# Patient Record
Sex: Female | Born: 1955
Health system: Southern US, Community
[De-identification: ages and names within clinical notes are randomized; demographics above are authoritative.]

## PROBLEM LIST (undated history)

## (undated) DIAGNOSIS — K219 Gastro-esophageal reflux disease without esophagitis: Secondary | ICD-10-CM

## (undated) DIAGNOSIS — M1990 Unspecified osteoarthritis, unspecified site: Secondary | ICD-10-CM

## (undated) DIAGNOSIS — I10 Essential (primary) hypertension: Secondary | ICD-10-CM

## (undated) DIAGNOSIS — C801 Malignant (primary) neoplasm, unspecified: Secondary | ICD-10-CM

## (undated) HISTORY — PX: BREAST BIOPSY: SHX20

## (undated) HISTORY — DX: Unspecified osteoarthritis, unspecified site: M19.90

## (undated) HISTORY — PX: JOINT REPLACEMENT: SHX530

## (undated) HISTORY — DX: Gastro-esophageal reflux disease without esophagitis: K21.9

## (undated) HISTORY — PX: KNEE SURGERY: SHX244

## (undated) HISTORY — DX: Essential (primary) hypertension: I10

---

## 1998-10-11 ENCOUNTER — Ambulatory Visit (HOSPITAL_BASED_OUTPATIENT_CLINIC_OR_DEPARTMENT_OTHER): Admission: RE | Admit: 1998-10-11 | Discharge: 1998-10-11 | Payer: Self-pay

## 1999-07-28 ENCOUNTER — Other Ambulatory Visit: Admission: RE | Admit: 1999-07-28 | Discharge: 1999-07-28 | Payer: Self-pay | Admitting: Obstetrics and Gynecology

## 2001-06-19 ENCOUNTER — Other Ambulatory Visit: Admission: RE | Admit: 2001-06-19 | Discharge: 2001-06-19 | Payer: Self-pay | Admitting: Obstetrics and Gynecology

## 2004-05-31 ENCOUNTER — Encounter: Admission: RE | Admit: 2004-05-31 | Discharge: 2004-05-31 | Payer: Self-pay | Admitting: Obstetrics and Gynecology

## 2009-03-08 ENCOUNTER — Emergency Department (HOSPITAL_COMMUNITY): Admission: EM | Admit: 2009-03-08 | Discharge: 2009-03-08 | Payer: Self-pay | Admitting: Family Medicine

## 2010-06-29 ENCOUNTER — Emergency Department (HOSPITAL_BASED_OUTPATIENT_CLINIC_OR_DEPARTMENT_OTHER): Admission: EM | Admit: 2010-06-29 | Discharge: 2010-06-29 | Payer: Self-pay | Admitting: Emergency Medicine

## 2010-06-29 ENCOUNTER — Ambulatory Visit: Payer: Self-pay | Admitting: Diagnostic Radiology

## 2010-09-17 ENCOUNTER — Ambulatory Visit (HOSPITAL_COMMUNITY)
Admission: RE | Admit: 2010-09-17 | Discharge: 2010-09-17 | Payer: Self-pay | Source: Home / Self Care | Attending: Oncology | Admitting: Oncology

## 2010-12-21 LAB — BASIC METABOLIC PANEL
BUN: 20 mg/dL (ref 6–23)
CO2: 27 mEq/L (ref 19–32)
Calcium: 8.8 mg/dL (ref 8.4–10.5)
Chloride: 105 mEq/L (ref 96–112)
Creatinine, Ser: 0.8 mg/dL (ref 0.4–1.2)
GFR calc Af Amer: >60 mL/min (ref >60)
GFR calc non Af Amer: >60 mL/min (ref >60)
Glucose, Bld: 154 mg/dL — ABNORMAL HIGH (ref 70–99)
Potassium: 4.2 mEq/L (ref 3.5–5.1)
Sodium: 139 mEq/L (ref 135–145)

## 2010-12-21 LAB — URINALYSIS, ROUTINE W REFLEX MICROSCOPIC
Bilirubin Urine: NEGATIVE
Glucose, UA: 100 mg/dL — AB
Hgb urine dipstick: NEGATIVE
Ketones, ur: NEGATIVE mg/dL
Nitrite: NEGATIVE
Protein, ur: NEGATIVE mg/dL
Specific Gravity, Urine: 1.034 — ABNORMAL HIGH (ref 1.005–1.030)
Urobilinogen, UA: 0.2 mg/dL (ref 0.0–1.0)
pH: 8 (ref 5.0–8.0)

## 2010-12-21 LAB — CBC
HCT: 37.2 % (ref 36.0–46.0)
Hemoglobin: 12.9 g/dL (ref 12.0–15.0)
MCH: 29.6 pg (ref 26.0–34.0)
MCHC: 34.6 g/dL (ref 30.0–36.0)
MCV: 85.6 fL (ref 78.0–100.0)
Platelets: 207 10*3/uL (ref 150–400)
RBC: 4.35 MIL/uL (ref 3.87–5.11)
RDW: 12.6 % (ref 11.5–15.5)
WBC: 6.1 10*3/uL (ref 4.0–10.5)

## 2010-12-21 LAB — DIFFERENTIAL
Basophils Absolute: 0.1 10*3/uL (ref 0.0–0.1)
Basophils Relative: 1 % (ref 0–1)
Eosinophils Absolute: 0 10*3/uL (ref 0.0–0.7)
Eosinophils Relative: 1 % (ref 0–5)
Lymphocytes Relative: 14 % (ref 12–46)
Lymphs Abs: 0.8 10*3/uL (ref 0.7–4.0)
Monocytes Absolute: 0.4 10*3/uL (ref 0.1–1.0)
Monocytes Relative: 6 % (ref 3–12)
Neutro Abs: 4.8 10*3/uL (ref 1.7–7.7)
Neutrophils Relative %: 78 % — ABNORMAL HIGH (ref 43–77)

## 2010-12-21 LAB — PREGNANCY, URINE: Preg Test, Ur: NEGATIVE

## 2011-01-15 LAB — URINE CULTURE: Colony Count: 75000

## 2011-01-15 LAB — POCT URINALYSIS DIP (DEVICE)
Bilirubin Urine: NEGATIVE
Glucose, UA: 100 mg/dL — AB
Ketones, ur: NEGATIVE mg/dL
Nitrite: POSITIVE — AB
Protein, ur: 100 mg/dL — AB
Specific Gravity, Urine: <=1.005 (ref 1.005–1.030)
Urobilinogen, UA: 1 mg/dL (ref 0.0–1.0)
pH: 5.5 (ref 5.0–8.0)

## 2011-01-15 LAB — POCT PREGNANCY, URINE: Preg Test, Ur: NEGATIVE

## 2014-08-15 ENCOUNTER — Ambulatory Visit (INDEPENDENT_AMBULATORY_CARE_PROVIDER_SITE_OTHER): Payer: 59 | Admitting: Family Medicine

## 2014-08-15 VITALS — BP 134/86 | HR 104 | Temp 99.1°F | Resp 18 | Ht 62.0 in | Wt 150.0 lb

## 2014-08-15 DIAGNOSIS — J069 Acute upper respiratory infection, unspecified: Secondary | ICD-10-CM

## 2014-08-15 MED ORDER — IPRATROPIUM BROMIDE 0.03 % NA SOLN: 2.0000 | Freq: Two times a day (BID) | NASAL | Status: DC

## 2014-08-15 MED ORDER — HYDROCOD POLST-CHLORPHEN POLST 10-8 MG/5ML PO LQCR: 5.0000 mL | Freq: Two times a day (BID) | ORAL | Status: DC | PRN

## 2014-08-15 NOTE — Progress Notes (Signed)
Subjective:    Patient ID: Emily Foley, female    DOB: [birth year], 58 y.o.   MRN: [id number] There are no active problems to display for this patient.  Prior to Admission medications   Medication Sig Start Date End Date Taking? Authorizing Provider  losartan (COZAAR) 50 MG tablet Take 50 mg by mouth daily.   Yes Historical Provider, MD                 No Known Allergies  HPI  This is a 58 year old female presenting with 5 days of cough and sore throat. She reports the sore throat is improving but the cough is worsening. Her cough was dry until this morning when she coughed up green phlegm. She reports this morning she also started to feel nasally congested with some sinus pressure. She felt feverish on the first day of illness but nothing since. She denies otalgia, wheezing or SOB. She does not have a history of asthma and is not a smoker. She has tried dayquil and nyquil. The nyquil has helped her sleep. 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  Review of Systems  Constitutional: Positive for fever. Negative for chills.  HENT: Positive for congestion, sinus pressure and sore throat. Negative for ear pain.   Eyes: Negative.   Respiratory: Positive for cough. Negative for shortness of breath and wheezing.   Gastrointestinal: Negative.   Skin: Negative.   Allergic/Immunologic: Positive for environmental allergies.      Objective:   Physical Exam  Constitutional: She is oriented to person, place, and time. She appears well-developed and well-nourished. No distress.  HENT:  Head: Normocephalic and atraumatic.  Right Ear: Hearing, tympanic membrane, external ear and ear canal normal.  Left Ear: Hearing, tympanic membrane, external ear and ear canal normal.  Nose: Mucosal edema present.  Mouth/Throat: Uvula is midline and mucous membranes are normal. Posterior oropharyngeal erythema present. No oropharyngeal exudate or posterior oropharyngeal edema.    Eyes: Conjunctivae and lids are normal. Right eye exhibits no discharge. Left eye exhibits no discharge. No scleral icterus.  Cardiovascular: Normal rate, regular rhythm, normal heart sounds and normal pulses.   No murmur heard. Pulmonary/Chest: Effort normal and breath sounds normal. No respiratory distress. She has no wheezes. She has no rhonchi. She has no rales.  Lymphadenopathy:       Head (right side): No submental, no submandibular, no tonsillar, no preauricular, no posterior auricular and no occipital adenopathy present.       Head (left side): No submental, no submandibular, no tonsillar, no preauricular, no posterior auricular and no occipital adenopathy present.    She has cervical adenopathy.       Right cervical: Superficial cervical adenopathy present. No deep cervical and no posterior cervical adenopathy present.      Left cervical: No superficial cervical, no deep cervical and no posterior cervical adenopathy present.  Neurological: She is alert and oriented to person, place, and time.  Skin: Skin is warm, dry and intact. No lesion and no rash noted.  Psychiatric: She has a normal mood and affect. Her speech is normal and behavior is normal. Thought content normal.      Assessment & Plan:  1. Viral URI This is likely a viral URI as it has only been going on for 5 days and cough only became productive today. She will use atrovent nasal spray, mucinex and tussionex. She will stop nyquil. Will return in 5-7 days  if symptoms worsen or fail to improve.  - ipratropium (ATROVENT) 0.03 % nasal spray; Place 2 sprays into both nostrils 2 (two) times daily.  Dispense: 30 mL; Refill: 0 - chlorpheniramine-HYDROcodone (TUSSIONEX PENNKINETIC ER) 10-8 MG/5ML LQCR; Take 5 mLs by mouth every 12 (twelve) hours as needed for cough (cough).  Dispense: 100 mL; Refill: 0   Benjaman Pott. Drenda Freeze, MHS [facility]  08/15/2014

## 2014-08-15 NOTE — Patient Instructions (Signed)
Use atrovent nasal spray and mucinex. Stop taking nyquil and take tussionex at night instead Drink plenty of water and get plenty of rest. If you are still having a productive cough in 7 days, return to clinic.

## 2014-08-17 NOTE — Progress Notes (Signed)
Patient discussed with Ms. Bush. Agree with assessment and plan of care per her note.   

## 2015-10-11 DIAGNOSIS — J320 Chronic maxillary sinusitis: Secondary | ICD-10-CM | POA: Diagnosis not present

## 2015-10-11 MED FILL — AMOX-CLAV 500-125 MG TABLET: 500-125 | 10 days supply | Qty: 20 | Fill #0

## 2015-10-11 MED FILL — HYDROCODONE-HOMATROPINE SYR: 5-1.5 | 5 days supply | Qty: 100 | Fill #0

## 2015-10-18 MED FILL — LOSARTAN POTASSIUM 50 MG TA: 50 | 90 days supply | Qty: 90 | Fill #1

## 2015-12-28 MED FILL — CELECOXIB 200 MG CAPSULE: 200 | 30 days supply | Qty: 60 | Fill #2

## 2015-12-29 DIAGNOSIS — Z1231 Encounter for screening mammogram for malignant neoplasm of breast: Secondary | ICD-10-CM | POA: Diagnosis not present

## 2016-01-19 MED FILL — LOSARTAN POTASSIUM 50 MG TA: 50 | 90 days supply | Qty: 90 | Fill #0

## 2016-03-27 DIAGNOSIS — M25461 Effusion, right knee: Secondary | ICD-10-CM | POA: Diagnosis not present

## 2016-03-27 DIAGNOSIS — M25561 Pain in right knee: Secondary | ICD-10-CM | POA: Diagnosis not present

## 2016-03-27 DIAGNOSIS — M1731 Unilateral post-traumatic osteoarthritis, right knee: Secondary | ICD-10-CM | POA: Diagnosis not present

## 2016-04-11 MED FILL — LOSARTAN POTASSIUM 50 MG TA: 50 | 90 days supply | Qty: 90 | Fill #1

## 2016-04-13 DIAGNOSIS — M25561 Pain in right knee: Secondary | ICD-10-CM | POA: Diagnosis not present

## 2016-04-13 DIAGNOSIS — M1731 Unilateral post-traumatic osteoarthritis, right knee: Secondary | ICD-10-CM | POA: Diagnosis not present

## 2016-04-13 DIAGNOSIS — M25461 Effusion, right knee: Secondary | ICD-10-CM | POA: Diagnosis not present

## 2016-04-23 MED FILL — CELECOXIB 200 MG CAPSULE: 200 | 30 days supply | Qty: 60 | Fill #3

## 2016-05-03 DIAGNOSIS — M545 Low back pain: Secondary | ICD-10-CM | POA: Diagnosis not present

## 2016-05-03 DIAGNOSIS — R350 Frequency of micturition: Secondary | ICD-10-CM | POA: Diagnosis not present

## 2016-05-03 MED FILL — CIPROFLOXACIN HCL 500 MG TA: 500 | 5 days supply | Qty: 10 | Fill #0

## 2016-06-29 MED FILL — LOSARTAN POTASSIUM 50 MG TA: 50 | 90 days supply | Qty: 90 | Fill #0

## 2016-06-29 MED FILL — valACYclovir HCL 1 GM TABS: 1 | 45 days supply | Qty: 90 | Fill #0

## 2016-06-29 MED FILL — ZOVIRAX 5% CREAM: 5 | 30 days supply | Qty: 5 | Fill #0

## 2016-07-06 MED FILL — FLUTICASONE PROP 50 MCG SPR: 50 | 30 days supply | Qty: 16 | Fill #0

## 2016-08-15 ENCOUNTER — Other Ambulatory Visit (INDEPENDENT_AMBULATORY_CARE_PROVIDER_SITE_OTHER): Payer: Self-pay | Admitting: Orthopaedic Surgery

## 2016-09-27 DIAGNOSIS — E786 Lipoprotein deficiency: Secondary | ICD-10-CM | POA: Diagnosis not present

## 2016-09-27 DIAGNOSIS — R7301 Impaired fasting glucose: Secondary | ICD-10-CM | POA: Diagnosis not present

## 2016-09-27 DIAGNOSIS — Z1211 Encounter for screening for malignant neoplasm of colon: Secondary | ICD-10-CM | POA: Diagnosis not present

## 2016-09-27 DIAGNOSIS — I10 Essential (primary) hypertension: Secondary | ICD-10-CM | POA: Diagnosis not present

## 2016-10-02 DIAGNOSIS — R509 Fever, unspecified: Secondary | ICD-10-CM | POA: Diagnosis not present

## 2016-10-02 DIAGNOSIS — J101 Influenza due to other identified influenza virus with other respiratory manifestations: Secondary | ICD-10-CM | POA: Diagnosis not present

## 2016-10-02 DIAGNOSIS — J209 Acute bronchitis, unspecified: Secondary | ICD-10-CM | POA: Diagnosis not present

## 2016-10-02 DIAGNOSIS — J029 Acute pharyngitis, unspecified: Secondary | ICD-10-CM | POA: Diagnosis not present

## 2016-10-02 MED FILL — OSELTAMIVIR PHOS 75 MG CAP: 75 | 5 days supply | Qty: 10 | Fill #0

## 2016-10-02 MED FILL — AZITHROMYCIN 250 MG TABLET: 250 | 5 days supply | Qty: 6 | Fill #0

## 2016-10-11 MED FILL — LOSARTAN POTASSIUM 50 MG TA: 50 | 90 days supply | Qty: 90 | Fill #1

## 2016-10-18 MED FILL — FLUTICASONE PROP 50 MCG SPR: 50 | 30 days supply | Qty: 16 | Fill #1

## 2016-11-23 ENCOUNTER — Other Ambulatory Visit (INDEPENDENT_AMBULATORY_CARE_PROVIDER_SITE_OTHER): Payer: Self-pay | Admitting: Orthopaedic Surgery

## 2016-11-23 MED FILL — FLUTICASONE PROP 50 MCG SPR: 50 | 30 days supply | Qty: 16 | Fill #0

## 2016-12-06 MED FILL — CELECOXIB 200 MG CAP: 200 | 30 days supply | Qty: 30 | Fill #0

## 2016-12-06 MED FILL — DICLOFENAC SODIUM 1% GEL: 1 | 37 days supply | Qty: 300 | Fill #0

## 2017-01-01 ENCOUNTER — Other Ambulatory Visit: Payer: Self-pay | Admitting: Gastroenterology

## 2017-01-01 DIAGNOSIS — R1033 Periumbilical pain: Secondary | ICD-10-CM | POA: Diagnosis not present

## 2017-01-01 DIAGNOSIS — R102 Pelvic and perineal pain: Secondary | ICD-10-CM | POA: Diagnosis not present

## 2017-01-01 DIAGNOSIS — R140 Abdominal distension (gaseous): Secondary | ICD-10-CM

## 2017-01-01 DIAGNOSIS — Z1211 Encounter for screening for malignant neoplasm of colon: Secondary | ICD-10-CM | POA: Diagnosis not present

## 2017-01-02 ENCOUNTER — Other Ambulatory Visit: Payer: Self-pay | Admitting: Gastroenterology

## 2017-01-02 DIAGNOSIS — R1033 Periumbilical pain: Secondary | ICD-10-CM

## 2017-01-03 ENCOUNTER — Ambulatory Visit (HOSPITAL_COMMUNITY)
Admission: RE | Admit: 2017-01-03 | Discharge: 2017-01-03 | Disposition: A | Discharge status: Home / Self Care | Payer: 59 | Source: Ambulatory Visit | Attending: Gastroenterology | Admitting: Gastroenterology

## 2017-01-03 DIAGNOSIS — R1033 Periumbilical pain: Secondary | ICD-10-CM | POA: Insufficient documentation

## 2017-01-03 DIAGNOSIS — R102 Pelvic and perineal pain: Secondary | ICD-10-CM | POA: Diagnosis not present

## 2017-01-03 DIAGNOSIS — R161 Splenomegaly, not elsewhere classified: Secondary | ICD-10-CM | POA: Insufficient documentation

## 2017-01-03 LAB — POCT I-STAT CREATININE: Creatinine, Ser: 0.9 mg/dL (ref 0.44–1.00)

## 2017-01-03 MED ORDER — IOPAMIDOL (ISOVUE-300) INJECTION 61%
100.0000 mL | Freq: Once | INTRAVENOUS | Status: AC | PRN
Start: 2017-01-03 — End: 2017-01-03
  Administered 2017-01-03: 100 mL via INTRAVENOUS

## 2017-01-03 MED ORDER — IOPAMIDOL (ISOVUE-300) INJECTION 61%
INTRAVENOUS | Status: AC
  Filled 2017-01-03: qty 100

## 2017-01-07 MED FILL — FLUTICASONE PROP 50 MCG SPR: 50 | 30 days supply | Qty: 16 | Fill #1

## 2017-01-09 ENCOUNTER — Telehealth: Payer: Self-pay | Admitting: Hematology and Oncology

## 2017-01-09 NOTE — Telephone Encounter (Signed)
On 4/3 pt has cld saying she's been referred to the office. I explained that I hadn't received the referral and that I would contact Dr. Lorie Apley office. Spoke to Campbell at Dr. Lorie Apley office. She states that Dr. Collene Mares had spoken to Dr. Lindi Adie at the pt. I provided Lattie Haw with our fax# to send the referral. Scheduled the pt to see Dr. Lindi Adie on 4/23 at 1pm. I cld the pt w/the appt information. Pt made aware to arrive 30 minutes early. Pt voiced understanding.

## 2017-01-14 MED FILL — LOSARTAN POTASSIUM 50 MG TA: 50 | 90 days supply | Qty: 90 | Fill #0

## 2017-01-28 ENCOUNTER — Encounter: Payer: Self-pay | Admitting: Hematology and Oncology

## 2017-01-28 ENCOUNTER — Ambulatory Visit (HOSPITAL_BASED_OUTPATIENT_CLINIC_OR_DEPARTMENT_OTHER): Payer: 59 | Admitting: Hematology and Oncology

## 2017-01-28 ENCOUNTER — Other Ambulatory Visit (HOSPITAL_BASED_OUTPATIENT_CLINIC_OR_DEPARTMENT_OTHER): Payer: 59

## 2017-01-28 VITALS — BP 132/70 | HR 88 | Temp 98.4°F | Resp 16 | Ht 62.0 in | Wt 142.5 lb

## 2017-01-28 DIAGNOSIS — R718 Other abnormality of red blood cells: Secondary | ICD-10-CM

## 2017-01-28 DIAGNOSIS — D649 Anemia, unspecified: Secondary | ICD-10-CM | POA: Diagnosis not present

## 2017-01-28 DIAGNOSIS — D696 Thrombocytopenia, unspecified: Secondary | ICD-10-CM

## 2017-01-28 DIAGNOSIS — R161 Splenomegaly, not elsewhere classified: Secondary | ICD-10-CM | POA: Diagnosis not present

## 2017-01-28 LAB — FERRITIN: Ferritin: 269 ng/ml (ref 9–269)

## 2017-01-28 LAB — CBC & DIFF AND RETIC
BASO%: 0 % (ref 0.0–2.0)
Basophils Absolute: 0 10*3/uL (ref 0.0–0.1)
EOS%: 1.8 % (ref 0.0–7.0)
Eosinophils Absolute: 0.1 10*3/uL (ref 0.0–0.5)
HCT: 33.9 % — ABNORMAL LOW (ref 34.8–46.6)
HGB: 10.8 g/dL — ABNORMAL LOW (ref 11.6–15.9)
Immature Retic Fract: 8.5 % (ref 1.60–10.00)
LYMPH%: 33.4 % (ref 14.0–49.7)
MCH: 24.8 pg — ABNORMAL LOW (ref 25.1–34.0)
MCHC: 31.9 g/dL (ref 31.5–36.0)
MCV: 77.8 fL — ABNORMAL LOW (ref 79.5–101.0)
MONO#: 0.3 10*3/uL (ref 0.1–0.9)
MONO%: 7.8 % (ref 0.0–14.0)
NEUT#: 1.9 10*3/uL (ref 1.5–6.5)
NEUT%: 57 % (ref 38.4–76.8)
Platelets: 137 10*3/uL — ABNORMAL LOW (ref 145–400)
RBC: 4.36 10*6/uL (ref 3.70–5.45)
RDW: 15.9 % — ABNORMAL HIGH (ref 11.2–14.5)
Retic %: 2.25 % — ABNORMAL HIGH (ref 0.70–2.10)
Retic Ct Abs: 98.1 10*3/uL — ABNORMAL HIGH (ref 33.70–90.70)
WBC: 3.3 10*3/uL — ABNORMAL LOW (ref 3.9–10.3)
lymph#: 1.1 10*3/uL (ref 0.9–3.3)

## 2017-01-28 LAB — IRON AND TIBC
%SAT: 13 % — ABNORMAL LOW (ref 21–57)
Iron: 35 ug/dL — ABNORMAL LOW (ref 41–142)
TIBC: 274 ug/dL (ref 236–444)
UIBC: 239 ug/dL (ref 120–384)

## 2017-01-28 NOTE — Assessment & Plan Note (Signed)
Thrombocytopenia platelet count of 92,000 on 12/23/2016 Prior to this, her CBC did not show any great abnormalities. On the most recent CBC done in March, her hemoglobin was also slightly low at 11.6 and MCV of 77 ferritin was elevated at 220 and she was sent to [place] for discussion regarding management. She was seen by Dr. Collene Mares who is scheduling her for a colonoscopy. She has had epigastric abdominal discomfort for several days. Repeat CBC done today revealed WBC of 3.3, hemoglobin 10.8 and platelet count 137 which had improved from 92.  Differential diagnosis: 1. Splenomegaly causing splenic sequestration of platelets 2. fatty liver causing splenomegaly 3. Bone marrow disorders  Although the platelet count had improved, the hemoglobin has come down. And there is evidence of microcytosis. I obtained a complete iron panel today. We are waiting for these results to determine if she has iron deficiency. If she is confirmed to have iron deficiency, I suspect bleeding is a likely cause and Dr. Collene Mares can investigate the cause of it. She may then need to go on oral iron therapy.  I will see her back in 3 months and follow-up. I will call her with the results of iron studies.

## 2017-01-28 NOTE — Progress Notes (Signed)
[facility] CONSULT NOTE  Patient Care Team: Hulan Fess, MD as PCP - General (Family Medicine) Consulting physician: Dr. Collene Mares  CHIEF COMPLAINTS/PURPOSE OF CONSULTATION:  Thrombocytopenia and anemia  HISTORY OF PRESENTING ILLNESS:  Emily Foley 61 y.o. female is here because of recent diagnosis of thrombocytopenia. 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 She saw Dr. Collene Mares as an outpatient for screening colonoscopy. Blood work done in March revealed a platelet count of 92. This had dropped from 145 in December 2017. She has not had any bruising or bleeding symptoms. She was anxious about getting colonoscopy with a platelet count of 92. She was sent to me for further evaluation off this new onset of thrombocytopenia. Of note she was also noted to have mild anemia and microcytosis. Dr. Collene Mares had performed a ferritin which came back at 220. She has noticed mild night sweats recently. She has not had any unintentional weight loss. She tried hard and lost 6 pounds recently. Denies any lumps or nodules in the neck or groin.  I reviewed her records extensively and collaborated the history with the patient.  MEDICAL HISTORY:  Past Medical History:  Diagnosis Date  . Arthritis   . Hypertension     SURGICAL HISTORY: No past surgical history on file.  SOCIAL HISTORY: Social History   Social History  . Marital status: Single    Spouse name: N/A  . Number of children: N/A  . Years of education: N/A   Occupational History  . Not on file.   Social History Main Topics  . Smoking status: Never Smoker  . Smokeless tobacco: Not on file  . Alcohol use Not on file  . Drug use: Unknown  . Sexual activity: Not on file   Other Topics Concern  . Not on file   Social History Narrative  . No narrative on file    FAMILY HISTORY: Family History  Problem Relation Age of Onset  . Diabetes Mother   . Hyperlipidemia Mother   . Hypertension Mother      ALLERGIES:  has No Known Allergies.  MEDICATIONS:  Current Outpatient Prescriptions  Medication Sig Dispense Refill  . chlorpheniramine-HYDROcodone (TUSSIONEX PENNKINETIC ER) 10-8 MG/5ML LQCR Take 5 mLs by mouth every 12 (twelve) hours as needed for cough (cough). 100 mL 0  . ipratropium (ATROVENT) 0.03 % nasal spray Place 2 sprays into both nostrils 2 (two) times daily. 30 mL 0  . losartan (COZAAR) 50 MG tablet Take 50 mg by mouth daily.     No current facility-administered medications for this visit.     REVIEW OF SYSTEMS:   Constitutional: Denies fevers, chills or abnormal night sweats, Complains of occasional night sweats Eyes: Denies blurriness of vision, double vision or watery eyes Ears, nose, mouth, throat, and face: Denies mucositis or sore throat Respiratory: Denies cough, dyspnea or wheezes Cardiovascular: Denies palpitation, chest discomfort or lower extremity swelling Gastrointestinal:  Denies nausea, heartburn or change in bowel habits Skin: Denies abnormal skin rashes Lymphatics: Denies new lymphadenopathy or easy bruising Neurological:Denies numbness, tingling or new weaknesses Behavioral/Psych: Mood is stable, no new changes  All other systems were reviewed with the patient and are negative.  PHYSICAL EXAMINATION: ECOG PERFORMANCE STATUS: 1 - Symptomatic but completely ambulatory  Vitals:   01/28/17 1301  BP: 132/70  Pulse: 88  Resp: 16  Temp: 98.4 F (36.9 C)   Filed Weights   01/28/17 1301  Weight: 142 lb 8 oz (64.6 kg)  GENERAL:alert, no distress and comfortable SKIN: skin color, texture, turgor are normal, no rashes or significant lesions EYES: normal, conjunctiva are pink and non-injected, sclera clear OROPHARYNX:no exudate, no erythema and lips, buccal mucosa, and tongue normal  NECK: supple, thyroid normal size, non-tender, without nodularity LYMPH:  no palpable lymphadenopathy in the cervical, axillary or inguinal LUNGS: clear to  auscultation and percussion with normal breathing effort HEART: regular rate & rhythm and no murmurs and no lower extremity edema ABDOMEN:abdomen soft, non-tender and normal bowel sounds, Palpable slightly enlarged spleen 2 fingerbreadths below the left posterior margin Musculoskeletal:no cyanosis of digits and no clubbing  PSYCH: alert & oriented x 3 with fluent speech NEURO: no focal motor/sensory deficits   LABORATORY DATA:  I have reviewed the data as listed Lab Results  Component Value Date   WBC 3.3 (L) 01/28/2017   HGB 10.8 (L) 01/28/2017   HCT 33.9 (L) 01/28/2017   MCV 77.8 (L) 01/28/2017   PLT 137 (L) 01/28/2017   Lab Results  Component Value Date   NA 139 06/29/2010   K 4.2 06/29/2010   CL 105 06/29/2010   CO2 27 06/29/2010    RADIOGRAPHIC STUDIES: I have personally reviewed the radiological reports and agreed with the findings in the report.  ASSESSMENT AND PLAN:  Thrombocytopenia (HCC) Thrombocytopenia platelet count of 92,000 on 12/23/2016 Prior to this, her CBC did not show any great abnormalities. On the most recent CBC done in March, her hemoglobin was also slightly low at 11.6 and MCV of 77 ferritin was elevated at 220 and she was sent to [place] for discussion regarding management. She was seen by Dr. Collene Mares who is scheduling her for a colonoscopy. She has had epigastric abdominal discomfort for several days. Repeat CBC done today revealed WBC of 3.3, hemoglobin 10.8 and platelet count 137 which had improved from 92.  Differential diagnosis: 1. Splenomegaly causing splenic sequestration of platelets 2. Autoimmune causes 3. Bone marrow disorders  Splenomegaly: Spleen span is 16 cm. There was no comment made on fatty liver. I discussed with her the differential diagnosis splenomegaly which includes liver disorders as well as lymphomas. There is no clinical evidence of lymphoma. Based on the prior history of elevated AST ALT, I strongly suspect that fatty liver may  be a reason.  Although the platelet count had improved, the hemoglobin has come down. And there is evidence of microcytosis. I obtained a complete iron panel today. We are waiting for these results to determine if she has iron deficiency. If she is confirmed to have iron deficiency, I suspect bleeding is a likely cause and Dr. Collene Mares can investigate the cause of it. She may then need to go on oral iron therapy.  I will see her back in 3 months and follow-up. I will call her with the results of iron studies.   All questions were answered. The patient knows to call the clinic with any problems, questions or concerns.    Rulon Eisenmenger, MD 01/28/17

## 2017-01-29 LAB — C-REACTIVE PROTEIN: CRP: 27.8 mg/L — ABNORMAL HIGH (ref 0.0–4.9)

## 2017-01-29 MED FILL — GAVILYTE-G SOLUTION: 236 | 1 days supply | Qty: 4000 | Fill #0

## 2017-01-30 DIAGNOSIS — Z1211 Encounter for screening for malignant neoplasm of colon: Secondary | ICD-10-CM | POA: Diagnosis not present

## 2017-01-30 DIAGNOSIS — D509 Iron deficiency anemia, unspecified: Secondary | ICD-10-CM | POA: Diagnosis not present

## 2017-01-30 DIAGNOSIS — D72820 Lymphocytosis (symptomatic): Secondary | ICD-10-CM | POA: Diagnosis not present

## 2017-01-30 DIAGNOSIS — B9681 Helicobacter pylori [H. pylori] as the cause of diseases classified elsewhere: Secondary | ICD-10-CM | POA: Diagnosis not present

## 2017-01-30 DIAGNOSIS — K2970 Gastritis, unspecified, without bleeding: Secondary | ICD-10-CM | POA: Diagnosis not present

## 2017-01-30 DIAGNOSIS — K2950 Unspecified chronic gastritis without bleeding: Secondary | ICD-10-CM | POA: Diagnosis not present

## 2017-02-01 DIAGNOSIS — H524 Presbyopia: Secondary | ICD-10-CM | POA: Diagnosis not present

## 2017-02-01 MED FILL — XIIDRA 5% EYE DROPS: 5 | 30 days supply | Qty: 60 | Fill #0

## 2017-04-24 MED FILL — XIIDRA 5% EYE DROPS: 5 | 30 days supply | Qty: 60 | Fill #1

## 2017-04-29 ENCOUNTER — Other Ambulatory Visit: Payer: 59

## 2017-04-29 ENCOUNTER — Other Ambulatory Visit (HOSPITAL_BASED_OUTPATIENT_CLINIC_OR_DEPARTMENT_OTHER): Payer: 59

## 2017-04-29 ENCOUNTER — Encounter: Payer: Self-pay | Admitting: Hematology and Oncology

## 2017-04-29 ENCOUNTER — Ambulatory Visit (HOSPITAL_BASED_OUTPATIENT_CLINIC_OR_DEPARTMENT_OTHER): Payer: 59 | Admitting: Hematology and Oncology

## 2017-04-29 VITALS — BP 135/69 | HR 84 | Temp 98.3°F | Resp 18 | Ht 62.0 in | Wt 137.2 lb

## 2017-04-29 DIAGNOSIS — D696 Thrombocytopenia, unspecified: Secondary | ICD-10-CM

## 2017-04-29 DIAGNOSIS — R161 Splenomegaly, not elsewhere classified: Secondary | ICD-10-CM | POA: Diagnosis not present

## 2017-04-29 DIAGNOSIS — D509 Iron deficiency anemia, unspecified: Secondary | ICD-10-CM

## 2017-04-29 DIAGNOSIS — R718 Other abnormality of red blood cells: Secondary | ICD-10-CM

## 2017-04-29 LAB — CBC & DIFF AND RETIC
BASO%: 0 % (ref 0.0–2.0)
Basophils Absolute: 0 10*3/uL (ref 0.0–0.1)
EOS%: 1.2 % (ref 0.0–7.0)
Eosinophils Absolute: 0 10*3/uL (ref 0.0–0.5)
HCT: 36.8 % (ref 34.8–46.6)
HGB: 11.6 g/dL (ref 11.6–15.9)
Immature Retic Fract: 6.5 % (ref 1.60–10.00)
LYMPH%: 26.4 % (ref 14.0–49.7)
MCH: 24.4 pg — ABNORMAL LOW (ref 25.1–34.0)
MCHC: 31.5 g/dL (ref 31.5–36.0)
MCV: 77.3 fL — ABNORMAL LOW (ref 79.5–101.0)
MONO#: 0.3 10*3/uL (ref 0.1–0.9)
MONO%: 8.1 % (ref 0.0–14.0)
NEUT#: 2.1 10*3/uL (ref 1.5–6.5)
NEUT%: 64.3 % (ref 38.4–76.8)
Platelets: 137 10*3/uL — ABNORMAL LOW (ref 145–400)
RBC: 4.76 10*6/uL (ref 3.70–5.45)
RDW: 15.1 % — ABNORMAL HIGH (ref 11.2–14.5)
Retic %: 1.35 % (ref 0.70–2.10)
Retic Ct Abs: 64.26 10*3/uL (ref 33.70–90.70)
WBC: 3.3 10*3/uL — ABNORMAL LOW (ref 3.9–10.3)
lymph#: 0.9 10*3/uL (ref 0.9–3.3)

## 2017-04-29 LAB — IRON AND TIBC
%SAT: 15 % — ABNORMAL LOW (ref 21–57)
Iron: 41 ug/dL (ref 41–142)
TIBC: 279 ug/dL (ref 236–444)
UIBC: 238 ug/dL (ref 120–384)

## 2017-04-29 LAB — COMPREHENSIVE METABOLIC PANEL
ALT: 21 U/L (ref 0–55)
AST: 20 U/L (ref 5–34)
Albumin: 4.1 g/dL (ref 3.5–5.0)
Alkaline Phosphatase: 94 U/L (ref 40–150)
Anion Gap: 9 mEq/L (ref 3–11)
BUN: 15.7 mg/dL (ref 7.0–26.0)
CO2: 25 mEq/L (ref 22–29)
Calcium: 9.5 mg/dL (ref 8.4–10.4)
Chloride: 106 mEq/L (ref 98–109)
Creatinine: 0.8 mg/dL (ref 0.6–1.1)
EGFR: 82 mL/min/{1.73_m2} — ABNORMAL LOW (ref >90)
Glucose: 108 mg/dl (ref 70–140)
Potassium: 4.5 mEq/L (ref 3.5–5.1)
Sodium: 140 mEq/L (ref 136–145)
Total Bilirubin: 0.85 mg/dL (ref 0.20–1.20)
Total Protein: 6.7 g/dL (ref 6.4–8.3)

## 2017-04-29 LAB — FERRITIN: Ferritin: 177 ng/ml (ref 9–269)

## 2017-04-29 MED ORDER — FERROUS SULFATE 325 (65 FE) MG PO TBEC: 325.0000 mg | DELAYED_RELEASE_TABLET | Freq: Every day | ORAL | 3 refills | Status: DC

## 2017-04-29 NOTE — Assessment & Plan Note (Signed)
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 Differential diagnosis between splenomegaly causing some sequestration versus autoimmune causes

## 2017-04-29 NOTE — Assessment & Plan Note (Signed)
Mild iron deficiency anemia with microcytosis: I prescribed her oral iron 325 mg once daily. Return to clinic in 3 months with recheck of the blood counts and follow-up.  Patient also has mild leukopenia but the actual differential on the white blood cells appears to be normal with an [place] of 2100 and  ALC of 900.

## 2017-04-29 NOTE — Progress Notes (Signed)
Patient Care Team: Hulan Fess, MD as PCP - General (Family Medicine)  DIAGNOSIS:  Encounter Diagnoses  Name Primary?  . Splenomegaly Yes  . Thrombocytopenia ([place])   . Iron deficiency anemia, unspecified iron deficiency anemia type     CHIEF COMPLIANT: Follow-up of thrombocytopenia and anemia  INTERVAL HISTORY: Emily Foley is a 61 year old with above-mentioned history of mild thrombocytopenia was platelet counts have recovered and is here for three-month follow-up. She reports no new complaints or concerns. Her previous colonoscopies and biopsies did not show any evidence of bleeding. She had lymphocytosis on the biopsy. She does not seem to think she has any gluten sensitivity. She was instructed by Dr. Collene Mares to stop gluten but she has not been following her instruction.  REVIEW OF SYSTEMS:   Constitutional: Denies fevers, chills or abnormal weight loss Eyes: Denies blurriness of vision Ears, nose, mouth, throat, and face: Denies mucositis or sore throat Respiratory: Denies cough, dyspnea or wheezes Cardiovascular: Denies palpitation, chest discomfort Gastrointestinal:  Denies nausea, heartburn or change in bowel habits Skin: Denies abnormal skin rashes Lymphatics: Denies new lymphadenopathy or easy bruising Neurological:Denies numbness, tingling or new weaknesses Behavioral/Psych: Mood is stable, no new changes  Extremities: No lower extremity edema Breast:  denies any pain or lumps or nodules in either breasts All other systems were reviewed with the patient and are negative.  I have reviewed the past medical history, past surgical history, social history and family history with the patient and they are unchanged from previous note.  ALLERGIES:  has No Known Allergies.  MEDICATIONS:  Current Outpatient Prescriptions  Medication Sig Dispense Refill  . chlorpheniramine-HYDROcodone (TUSSIONEX PENNKINETIC ER) 10-8 MG/5ML LQCR Take 5 mLs by mouth every 12 (twelve) hours as  needed for cough (cough). 100 mL 0  . ferrous sulfate 325 (65 FE) MG EC tablet Take 1 tablet (325 mg total) by mouth daily with breakfast. 30 tablet 3  . ipratropium (ATROVENT) 0.03 % nasal spray Place 2 sprays into both nostrils 2 (two) times daily. 30 mL 0  . losartan (COZAAR) 50 MG tablet Take 50 mg by mouth daily.     No current facility-administered medications for this visit.     PHYSICAL EXAMINATION: ECOG PERFORMANCE STATUS: 0 - Asymptomatic  Vitals:   04/29/17 0954  BP: 135/69  Pulse: 84  Resp: 18  Temp: 98.3 F (36.8 C)   Filed Weights   04/29/17 0954  Weight: 137 lb 3.2 oz (62.2 kg)    GENERAL:alert, no distress and comfortable SKIN: skin color, texture, turgor are normal, no rashes or significant lesions EYES: normal, Conjunctiva are pink and non-injected, sclera clear OROPHARYNX:no exudate, no erythema and lips, buccal mucosa, and tongue normal  NECK: supple, thyroid normal size, non-tender, without nodularity LYMPH:  no palpable lymphadenopathy in the cervical, axillary or inguinal LUNGS: clear to auscultation and percussion with normal breathing effort HEART: regular rate & rhythm and no murmurs and no lower extremity edema ABDOMEN:abdomen soft, non-tender and normal bowel sounds MUSCULOSKELETAL:no cyanosis of digits and no clubbing  NEURO: alert & oriented x 3 with fluent speech, no focal motor/sensory deficits EXTREMITIES: No lower extremity edema  LABORATORY DATA:  I have reviewed the data as listed   Chemistry      Component Value Date/Time   NA 139 06/29/2010 1411   K 4.2 06/29/2010 1411   CL 105 06/29/2010 1411   CO2 27 06/29/2010 1411   BUN 20 06/29/2010 1411   CREATININE 0.90 01/03/2017 1127  Component Value Date/Time   CALCIUM 8.8 06/29/2010 1411       Lab Results  Component Value Date   WBC 3.3 (L) 04/29/2017   HGB 11.6 04/29/2017   HCT 36.8 04/29/2017   MCV 77.3 (L) 04/29/2017   PLT 137 (L) 04/29/2017   NEUTROABS 2.1  04/29/2017    ASSESSMENT & PLAN:  Thrombocytopenia (HCC) Platelet counts 137 and is stable 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 Differential diagnosis between splenomegaly causing some sequestration versus autoimmune causes   Iron deficiency anemia Mild iron deficiency anemia with microcytosis: I prescribed her oral iron 325 mg once daily. Return to clinic in 3 months with recheck of the blood counts and follow-up.  Patient also has mild leukopenia but the actual differential on the white blood cells appears to be normal with an [place] of 2100 and  ALC of 900.   I spent 25 minutes talking to the patient of which more than half was spent in counseling and coordination of care.  Orders Placed This Encounter  Procedures  . CBC with Differential    Standing Status:   Future    Standing Expiration Date:   04/29/2018  . Iron and TIBC    Standing Status:   Future    Standing Expiration Date:   04/29/2018  . Ferritin    Standing Status:   Future    Standing Expiration Date:   04/29/2018   The patient has a good understanding of the overall plan. she agrees with it. she will call with any problems that may develop before the next visit here.   Rulon Eisenmenger, MD 04/29/17

## 2017-05-01 ENCOUNTER — Ambulatory Visit: Payer: 59 | Admitting: Hematology and Oncology

## 2017-05-01 ENCOUNTER — Encounter: Payer: Self-pay | Admitting: Hematology and Oncology

## 2017-05-02 DIAGNOSIS — Z1231 Encounter for screening mammogram for malignant neoplasm of breast: Secondary | ICD-10-CM | POA: Diagnosis not present

## 2017-05-09 DIAGNOSIS — R768 Other specified abnormal immunological findings in serum: Secondary | ICD-10-CM | POA: Diagnosis not present

## 2017-05-09 DIAGNOSIS — K2970 Gastritis, unspecified, without bleeding: Secondary | ICD-10-CM | POA: Diagnosis not present

## 2017-05-09 DIAGNOSIS — R161 Splenomegaly, not elsewhere classified: Secondary | ICD-10-CM | POA: Diagnosis not present

## 2017-05-09 DIAGNOSIS — D696 Thrombocytopenia, unspecified: Secondary | ICD-10-CM | POA: Diagnosis not present

## 2017-05-09 DIAGNOSIS — B9681 Helicobacter pylori [H. pylori] as the cause of diseases classified elsewhere: Secondary | ICD-10-CM | POA: Diagnosis not present

## 2017-05-09 DIAGNOSIS — R7301 Impaired fasting glucose: Secondary | ICD-10-CM | POA: Diagnosis not present

## 2017-05-14 MED FILL — FLUTICASONE PROP 50 MCG SPR: 50 | 90 days supply | Qty: 48 | Fill #0

## 2017-06-20 DIAGNOSIS — D696 Thrombocytopenia, unspecified: Secondary | ICD-10-CM | POA: Diagnosis not present

## 2017-06-20 DIAGNOSIS — M2550 Pain in unspecified joint: Secondary | ICD-10-CM | POA: Diagnosis not present

## 2017-06-20 DIAGNOSIS — R7982 Elevated C-reactive protein (CRP): Secondary | ICD-10-CM | POA: Diagnosis not present

## 2017-06-20 DIAGNOSIS — R5383 Other fatigue: Secondary | ICD-10-CM | POA: Diagnosis not present

## 2017-06-20 DIAGNOSIS — R161 Splenomegaly, not elsewhere classified: Secondary | ICD-10-CM | POA: Diagnosis not present

## 2017-06-20 DIAGNOSIS — Z6823 Body mass index (BMI) 23.0-23.9, adult: Secondary | ICD-10-CM | POA: Diagnosis not present

## 2017-06-20 DIAGNOSIS — R768 Other specified abnormal immunological findings in serum: Secondary | ICD-10-CM | POA: Diagnosis not present

## 2017-06-20 DIAGNOSIS — M791 Myalgia: Secondary | ICD-10-CM | POA: Diagnosis not present

## 2017-06-25 DIAGNOSIS — R161 Splenomegaly, not elsewhere classified: Secondary | ICD-10-CM | POA: Diagnosis not present

## 2017-07-08 ENCOUNTER — Telehealth: Payer: Self-pay | Admitting: Hematology and Oncology

## 2017-07-08 ENCOUNTER — Telehealth: Payer: Self-pay

## 2017-07-08 NOTE — Telephone Encounter (Signed)
Spoke with Dr.Beekman's office to let them know that Dr.Gudena is requesting recent lab work for pt to be faxed to our office today. Provided their office with fax number.  1030am- Received fax from Dr.Beekman's office and gave to [place] for review. Pt aware of receipt.

## 2017-07-08 NOTE — Telephone Encounter (Signed)
I discussed the results of blood work done on 06/20/2017 WBC came down from 3.323 with [place] went down from 2.1-1.8. Hemoglobin decreased from 11.6-10.9 with MCV of 78 Platelets improved to 137-146 Apparently patient's mother and father both passed away recently and she has not taken the oral iron therapy. She will start taking it from today. Patient was concerned about the splenomegaly and the decrease in WBC count. I discussed with her that the [place] is still considered to be normal. The enlarged spleen can be monitored. I will see her back in 3 months with recheck of her labs. If her leukopenia persists then we will perform a bone marrow biopsy.

## 2017-07-09 ENCOUNTER — Telehealth: Payer: Self-pay | Admitting: Hematology and Oncology

## 2017-07-09 NOTE — Telephone Encounter (Signed)
Left message for patient regarding the changes in her appts per 10/1 sch msg. Sending her a confirmation letter as well.

## 2017-07-25 ENCOUNTER — Other Ambulatory Visit: Payer: Self-pay | Admitting: Gastroenterology

## 2017-07-25 DIAGNOSIS — R9389 Abnormal findings on diagnostic imaging of other specified body structures: Secondary | ICD-10-CM

## 2017-07-25 DIAGNOSIS — R1033 Periumbilical pain: Secondary | ICD-10-CM

## 2017-07-25 NOTE — Progress Notes (Signed)
Lauren Modisette MD 

## 2017-07-30 ENCOUNTER — Other Ambulatory Visit: Payer: 59

## 2017-07-30 ENCOUNTER — Ambulatory Visit: Payer: 59 | Admitting: Hematology and Oncology

## 2017-07-31 ENCOUNTER — Ambulatory Visit (HOSPITAL_COMMUNITY)
Admission: RE | Admit: 2017-07-31 | Discharge: 2017-07-31 | Disposition: A | Discharge status: Home / Self Care | Payer: 59 | Source: Ambulatory Visit | Attending: Gastroenterology | Admitting: Gastroenterology

## 2017-07-31 DIAGNOSIS — R9389 Abnormal findings on diagnostic imaging of other specified body structures: Secondary | ICD-10-CM | POA: Diagnosis not present

## 2017-07-31 DIAGNOSIS — K824 Cholesterolosis of gallbladder: Secondary | ICD-10-CM | POA: Diagnosis not present

## 2017-07-31 DIAGNOSIS — R161 Splenomegaly, not elsewhere classified: Secondary | ICD-10-CM | POA: Diagnosis not present

## 2017-07-31 DIAGNOSIS — R1033 Periumbilical pain: Secondary | ICD-10-CM | POA: Diagnosis not present

## 2017-08-09 MED FILL — CELECOXIB 200 MG CAPS: 200 | 30 days supply | Qty: 30 | Fill #1

## 2017-09-06 ENCOUNTER — Encounter: Payer: Self-pay | Admitting: Hematology and Oncology

## 2017-09-06 ENCOUNTER — Other Ambulatory Visit: Payer: Self-pay

## 2017-09-06 DIAGNOSIS — D509 Iron deficiency anemia, unspecified: Secondary | ICD-10-CM

## 2017-09-10 ENCOUNTER — Other Ambulatory Visit (HOSPITAL_BASED_OUTPATIENT_CLINIC_OR_DEPARTMENT_OTHER): Payer: 59

## 2017-09-10 ENCOUNTER — Encounter: Payer: Self-pay | Admitting: Hematology and Oncology

## 2017-09-10 ENCOUNTER — Other Ambulatory Visit: Payer: Self-pay

## 2017-09-10 DIAGNOSIS — D509 Iron deficiency anemia, unspecified: Secondary | ICD-10-CM | POA: Diagnosis not present

## 2017-09-10 DIAGNOSIS — D696 Thrombocytopenia, unspecified: Secondary | ICD-10-CM

## 2017-09-10 LAB — CBC WITH DIFFERENTIAL/PLATELET
BASO%: 0.5 % (ref 0.0–2.0)
Basophils Absolute: 0 10*3/uL (ref 0.0–0.1)
EOS%: 1.8 % (ref 0.0–7.0)
Eosinophils Absolute: 0.1 10*3/uL (ref 0.0–0.5)
HCT: 39.4 % (ref 34.8–46.6)
HGB: 12.8 g/dL (ref 11.6–15.9)
LYMPH%: 23.5 % (ref 14.0–49.7)
MCH: 25.7 pg (ref 25.1–34.0)
MCHC: 32.5 g/dL (ref 31.5–36.0)
MCV: 79 fL — ABNORMAL LOW (ref 79.5–101.0)
MONO#: 0.4 10*3/uL (ref 0.1–0.9)
MONO%: 10.7 % (ref 0.0–14.0)
NEUT#: 2.4 10*3/uL (ref 1.5–6.5)
NEUT%: 63.5 % (ref 38.4–76.8)
Platelets: 144 10*3/uL — ABNORMAL LOW (ref 145–400)
RBC: 4.99 10*6/uL (ref 3.70–5.45)
RDW: 15.6 % — ABNORMAL HIGH (ref 11.2–14.5)
WBC: 3.8 10*3/uL — ABNORMAL LOW (ref 3.9–10.3)
lymph#: 0.9 10*3/uL (ref 0.9–3.3)
nRBC: 0 % (ref 0–0)

## 2017-09-10 LAB — IRON AND TIBC
%SAT: 18 % — ABNORMAL LOW (ref 21–57)
Iron: 55 ug/dL (ref 41–142)
TIBC: 306 ug/dL (ref 236–444)
UIBC: 251 ug/dL (ref 120–384)

## 2017-09-10 LAB — FERRITIN: Ferritin: 136 ng/ml (ref 9–269)

## 2017-09-11 LAB — C-REACTIVE PROTEIN: CRP: 20.5 mg/L — ABNORMAL HIGH (ref 0.0–4.9)

## 2017-09-13 ENCOUNTER — Ambulatory Visit (HOSPITAL_BASED_OUTPATIENT_CLINIC_OR_DEPARTMENT_OTHER): Payer: 59 | Admitting: Hematology and Oncology

## 2017-09-13 VITALS — BP 135/74 | HR 78 | Temp 98.7°F | Resp 19 | Ht 62.0 in | Wt 134.6 lb

## 2017-09-13 DIAGNOSIS — D696 Thrombocytopenia, unspecified: Secondary | ICD-10-CM | POA: Diagnosis not present

## 2017-09-13 DIAGNOSIS — R161 Splenomegaly, not elsewhere classified: Secondary | ICD-10-CM

## 2017-09-13 DIAGNOSIS — D509 Iron deficiency anemia, unspecified: Secondary | ICD-10-CM | POA: Diagnosis not present

## 2017-09-13 NOTE — Assessment & Plan Note (Signed)
Currently on oral iron therapy Lab review:  Mild leukopenia:  Today's white count is ANC is  We discussed with the patient that if the counts continue to drop, we will perform bone marrow aspiration biopsy

## 2017-09-13 NOTE — Assessment & Plan Note (Signed)
Today's platelet count is Because of the enlarged spleen the differential diagnosis chronic sequestration versus autoimmune There is no indication to treat

## 2017-09-13 NOTE — Progress Notes (Signed)
Patient Care Team: Hulan Fess, MD as PCP - General (Family Medicine)  DIAGNOSIS:  Encounter Diagnoses  Name Primary?  . Thrombocytopenia ([place]) Yes  . Splenomegaly   . Iron deficiency anemia, unspecified iron deficiency anemia type     CHIEF COMPLIANT: Follow-up of iron deficiency anemia, splenomegaly and thrombocytopenia as well as mild leukopenia  INTERVAL HISTORY: Emily Foley is a 61 year old with above-mentioned history of thrombocytopenia splenomegaly and iron deficiency anemia as well as leukopenia who is here for 4-month follow-up.  She has not been consistent with her oral iron intake.  She denies any fevers or chills.  Denies any abdominal pain nausea vomiting diarrhea or constipation.  REVIEW OF SYSTEMS:   Constitutional: Denies fevers, chills or abnormal weight loss Eyes: Denies blurriness of vision Ears, nose, mouth, throat, and face: Denies mucositis or sore throat Respiratory: Denies cough, dyspnea or wheezes Cardiovascular: Denies palpitation, chest discomfort Gastrointestinal:  Denies nausea, heartburn or change in bowel habits Skin: Denies abnormal skin rashes Lymphatics: Denies new lymphadenopathy or easy bruising Neurological:Denies numbness, tingling or new weaknesses Behavioral/Psych: Mood is stable, no new changes  Extremities: No lower extremity edema  All other systems were reviewed with the patient and are negative.  I have reviewed the past medical history, past surgical history, social history and family history with the patient and they are unchanged from previous note.  ALLERGIES:  has No Known Allergies.  MEDICATIONS:  Current Outpatient Medications  Medication Sig Dispense Refill  . chlorpheniramine-HYDROcodone (TUSSIONEX PENNKINETIC ER) 10-8 MG/5ML LQCR Take 5 mLs by mouth every 12 (twelve) hours as needed for cough (cough). 100 mL 0  . ferrous sulfate 325 (65 FE) MG EC tablet Take 1 tablet (325 mg total) by mouth daily with breakfast. 30  tablet 3  . ipratropium (ATROVENT) 0.03 % nasal spray Place 2 sprays into both nostrils 2 (two) times daily. 30 mL 0  . losartan (COZAAR) 50 MG tablet Take 50 mg by mouth daily.     No current facility-administered medications for this visit.     PHYSICAL EXAMINATION: ECOG PERFORMANCE STATUS: 1 - Symptomatic but completely ambulatory  Vitals:   09/13/17 0945  BP: 135/74  Pulse: 78  Resp: 19  Temp: 98.7 F (37.1 C)  SpO2: 99%   Filed Weights   09/13/17 0945  Weight: 134 lb 9.6 oz (61.1 kg)    GENERAL:alert, no distress and comfortable SKIN: skin color, texture, turgor are normal, no rashes or significant lesions EYES: normal, Conjunctiva are pink and non-injected, sclera clear OROPHARYNX:no exudate, no erythema and lips, buccal mucosa, and tongue normal  NECK: supple, thyroid normal size, non-tender, without nodularity LYMPH:  no palpable lymphadenopathy in the cervical, axillary or inguinal LUNGS: clear to auscultation and percussion with normal breathing effort HEART: regular rate & rhythm and no murmurs and no lower extremity edema ABDOMEN:abdomen soft, non-tender and normal bowel sounds MUSCULOSKELETAL:no cyanosis of digits and no clubbing  NEURO: alert & oriented x 3 with fluent speech, no focal motor/sensory deficits EXTREMITIES: No lower extremity edema  LABORATORY DATA:  I have reviewed the data as listed   Chemistry      Component Value Date/Time   NA 140 04/29/2017 0940   K 4.5 04/29/2017 0940   CL 105 06/29/2010 1411   CO2 25 04/29/2017 0940   BUN 15.7 04/29/2017 0940   CREATININE 0.8 04/29/2017 0940      Component Value Date/Time   CALCIUM 9.5 04/29/2017 0940   ALKPHOS 94 04/29/2017 0940  AST 20 04/29/2017 0940   ALT 21 04/29/2017 0940   BILITOT 0.85 04/29/2017 0940       Lab Results  Component Value Date   WBC 3.8 (L) 09/10/2017   HGB 12.8 09/10/2017   HCT 39.4 09/10/2017   MCV 79.0 (L) 09/10/2017   PLT 144 (L) 09/10/2017   NEUTROABS 2.4  09/10/2017    ASSESSMENT & PLAN:  Thrombocytopenia (HCC) Today's platelet count is 144 Because of the enlarged spleen the differential diagnosis chronic sequestration versus autoimmune There is no indication to treat  Iron deficiency anemia Currently on oral iron therapy Lab review:  Mild leukopenia:  Today's white count is 3.8 ANC is 2.1  We discussed with the patient that if the counts continue to drop, we will perform bone marrow aspiration biopsy We will see her back in 4 months with recheck of her blood counts.  I spent 25 minutes talking to the patient of which more than half was spent in counseling and coordination of care.  Orders Placed This Encounter  Procedures  . CBC with Differential    Standing Status:   Future    Standing Expiration Date:   09/13/2018  . Ferritin    Standing Status:   Future    Standing Expiration Date:   09/13/2018  . Iron and TIBC    Standing Status:   Future    Standing Expiration Date:   09/13/2018  . Comprehensive metabolic panel    Standing Status:   Future    Standing Expiration Date:   09/13/2018   The patient has a good understanding of the overall plan. she agrees with it. she will call with any problems that may develop before the next visit here.   Rulon Eisenmenger, MD 09/13/17

## 2017-10-09 ENCOUNTER — Other Ambulatory Visit: Payer: 59

## 2017-10-10 ENCOUNTER — Ambulatory Visit: Payer: 59 | Admitting: Hematology and Oncology

## 2017-12-16 DIAGNOSIS — I341 Nonrheumatic mitral (valve) prolapse: Secondary | ICD-10-CM | POA: Diagnosis not present

## 2017-12-16 DIAGNOSIS — R509 Fever, unspecified: Secondary | ICD-10-CM | POA: Diagnosis not present

## 2017-12-16 DIAGNOSIS — D696 Thrombocytopenia, unspecified: Secondary | ICD-10-CM | POA: Diagnosis not present

## 2017-12-16 DIAGNOSIS — R002 Palpitations: Secondary | ICD-10-CM | POA: Diagnosis not present

## 2017-12-17 ENCOUNTER — Other Ambulatory Visit (HOSPITAL_COMMUNITY): Payer: Self-pay | Admitting: Family Medicine

## 2017-12-17 DIAGNOSIS — R011 Cardiac murmur, unspecified: Secondary | ICD-10-CM

## 2017-12-19 ENCOUNTER — Encounter: Payer: Self-pay | Admitting: Hematology and Oncology

## 2017-12-20 ENCOUNTER — Ambulatory Visit (HOSPITAL_COMMUNITY)
Admission: RE | Admit: 2017-12-20 | Discharge: 2017-12-20 | Disposition: A | Discharge status: Home / Self Care | Payer: 59 | Source: Ambulatory Visit | Attending: Family Medicine | Admitting: Family Medicine

## 2017-12-20 ENCOUNTER — Other Ambulatory Visit (HOSPITAL_COMMUNITY): Payer: Self-pay | Admitting: Family Medicine

## 2017-12-20 ENCOUNTER — Telehealth: Payer: Self-pay

## 2017-12-20 DIAGNOSIS — R05 Cough: Secondary | ICD-10-CM | POA: Diagnosis not present

## 2017-12-20 DIAGNOSIS — R059 Cough, unspecified: Secondary | ICD-10-CM

## 2017-12-20 NOTE — Telephone Encounter (Signed)
Pt called to say that she had all the lab work that [place] ordered, drawn at her primary care office this week. Will cancel lab for Monday and keep MD appt.

## 2017-12-23 ENCOUNTER — Telehealth: Payer: Self-pay | Admitting: Hematology and Oncology

## 2017-12-23 ENCOUNTER — Other Ambulatory Visit: Payer: 59

## 2017-12-23 ENCOUNTER — Inpatient Hospital Stay: Payer: 59 | Attending: Hematology and Oncology | Admitting: Hematology and Oncology

## 2017-12-23 VITALS — BP 123/54 | HR 95 | Temp 98.9°F | Resp 18 | Ht 62.0 in | Wt 135.1 lb

## 2017-12-23 DIAGNOSIS — R509 Fever, unspecified: Secondary | ICD-10-CM | POA: Insufficient documentation

## 2017-12-23 DIAGNOSIS — D509 Iron deficiency anemia, unspecified: Secondary | ICD-10-CM | POA: Diagnosis not present

## 2017-12-23 DIAGNOSIS — D61818 Other pancytopenia: Secondary | ICD-10-CM | POA: Diagnosis not present

## 2017-12-23 DIAGNOSIS — D696 Thrombocytopenia, unspecified: Secondary | ICD-10-CM | POA: Diagnosis not present

## 2017-12-23 DIAGNOSIS — R161 Splenomegaly, not elsewhere classified: Secondary | ICD-10-CM

## 2017-12-23 DIAGNOSIS — D709 Neutropenia, unspecified: Secondary | ICD-10-CM | POA: Insufficient documentation

## 2017-12-23 NOTE — Progress Notes (Signed)
Patient Care Team: Hulan Fess, MD as PCP - General (Family Medicine)  DIAGNOSIS:  Encounter Diagnoses  Name Primary?  . Thrombocytopenia ([place])   . Splenomegaly Yes  . 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  She has been having low-grade fevers for quite some time.  Previous workup did not reveal any major rheumatological abnormality.  She is here to review her blood work and to decide if she needs a bone marrow biopsy.  She does not have any excessive bruising or bleeding.  She does feel some degree of fatigue  REVIEW OF SYSTEMS:   Constitutional: Denies fevers, chills or abnormal weight loss Eyes: Denies blurriness of vision Ears, nose, mouth, throat, and face: Denies mucositis or sore throat Respiratory: Denies cough, dyspnea or wheezes Cardiovascular: Denies palpitation, chest discomfort Gastrointestinal:  Denies nausea, heartburn or change in bowel habits Skin: Denies abnormal skin rashes Lymphatics: Denies new lymphadenopathy or easy bruising Neurological:Denies numbness, tingling or new weaknesses Behavioral/Psych: Mood is stable, no new changes  Extremities: No lower extremity edema Breast:  denies any pain or lumps or nodules in either breasts All other systems were reviewed with the patient and are negative.  I have reviewed the past medical history, past surgical history, social history and family history with the patient and they are unchanged from previous note.  ALLERGIES:  has No Known Allergies.  MEDICATIONS:  Current Outpatient Medications  Medication Sig Dispense Refill  . chlorpheniramine-HYDROcodone (TUSSIONEX PENNKINETIC ER)  10-8 MG/5ML LQCR Take 5 mLs by mouth every 12 (twelve) hours as needed for cough (cough). 100 mL 0  . ferrous sulfate 325 (65 FE) MG EC tablet Take 1 tablet (325 mg total) by mouth daily with breakfast. 30 tablet 3  . ipratropium (ATROVENT) 0.03 % nasal spray Place 2 sprays into both nostrils 2 (two) times daily. 30 mL 0  . losartan (COZAAR) 50 MG tablet Take 50 mg by mouth daily.     No current facility-administered medications for this visit.     PHYSICAL EXAMINATION: ECOG PERFORMANCE STATUS: 1 - Symptomatic but completely ambulatory  Vitals:   12/23/17 1106  BP: (!) 123/54  Pulse: 95  Resp: 18  Temp: 98.9 F (37.2 C)  SpO2: 100%   Filed Weights   12/23/17 1106  Weight: 135 lb 1.6 oz (61.3 kg)    GENERAL:alert, no distress and comfortable SKIN: skin color, texture, turgor are normal, no rashes or significant lesions EYES: normal, Conjunctiva are pink and non-injected, sclera clear OROPHARYNX:no exudate, no erythema and lips, buccal mucosa, and tongue normal  NECK: supple, thyroid normal size, non-tender, without nodularity LYMPH:  no palpable lymphadenopathy in the cervical, axillary or inguinal LUNGS: clear to auscultation and percussion with normal breathing effort HEART: regular rate & rhythm and no murmurs and no lower extremity edema ABDOMEN:abdomen soft, non-tender and normal bowel sounds MUSCULOSKELETAL:no cyanosis of digits and no clubbing  NEURO: alert & oriented x 3 with fluent speech, no focal motor/sensory deficits EXTREMITIES: No lower extremity edema  LABORATORY DATA:  I have reviewed the data as listed CMP Latest Ref Rng & Units 04/29/2017 01/03/2017 06/29/2010  Glucose 70 - 140 mg/dl 108 - 154(H)  BUN 7.0 - 26.0 mg/dL 15.7 - 20  Creatinine 0.6 - 1.1 mg/dL 0.8 0.90 0.8  Sodium 136 - 145 mEq/L 140 - 139  Potassium 3.5 - 5.1 mEq/L 4.5 - 4.2  Chloride 96 - 112 mEq/L - - 105  CO2 22 - 29 mEq/L 25 - 27  Calcium 8.4 - 10.4 mg/dL 9.5 - 8.8  Total Protein 6.4 -  8.3 g/dL 6.7 - -  Total Bilirubin 0.20 - 1.20 mg/dL 0.85 - -  Alkaline Phos 40 - 150 U/L 94 - -  AST 5 - 34 U/L 20 - -  ALT 0 - 55 U/L 21 - -    Lab Results  Component Value Date   WBC 3.8 (L) 09/10/2017   HGB 12.8 09/10/2017   HCT 39.4 09/10/2017   MCV 79.0 (L) 09/10/2017   PLT 144 (L) 09/10/2017   NEUTROABS 2.4 09/10/2017    ASSESSMENT & PLAN:  Thrombocytopenia (HCC) Today's platelet count is 144 Because of the enlarged spleen the differential diagnosis chronic sequestration versus autoimmune There is no indication to treat  Iron deficiency anemia Resuming On oral iron therapy Lab review: Hemoglobin 9.9 down from 12.8, MCV 74.4, WBC 2.5 CRP 99.5, Ferritin 258 elevated due to acute phase reactant I recommended a doing a bone marrow biopsy at this time.  However the patient is not interested in the bone marrow biopsy and she would like her to be worked up for rheumatological causes before she would  December 2018: Ferritin 136, iron saturation 18%, hemoglobin 12.8 CRP 20.5  We discussed with the patient that if the counts continue to drop, we will perform bone marrow aspiration biopsy. I recommended doing a bone marrow biopsy but she does not appear to be convinced about it.  She will think about it and will call and let us know. I also recommended that she get IV iron therapy. However the patient is not interested in IV iron therapy and wants to do it naturally.   I spent 25 minutes talking to the patient of which more than half was spent in counseling and coordination of care.  No orders of the defined types were placed in this encounter.  The patient has a good understanding of the overall plan. she agrees with it. she will call with any problems that may develop before the next visit here.   Harriette Ohara, MD 12/23/17

## 2017-12-23 NOTE — Telephone Encounter (Signed)
Gave avs and calendar ° °

## 2017-12-23 NOTE — Assessment & Plan Note (Signed)
Today's platelet count is 144 Because of the enlarged spleen the differential diagnosis chronic sequestration versus autoimmune There is no indication to treat  Iron deficiency anemia Currently on oral iron therapy December 2018: Ferritin 136, iron saturation 18%, hemoglobin 12.8 CRP 20.5  We discussed with the patient that if the counts continue to drop, we will perform bone marrow aspiration biopsy

## 2017-12-24 ENCOUNTER — Ambulatory Visit (HOSPITAL_COMMUNITY): Admission: RE | Admit: 2017-12-24 | Payer: 59 | Source: Ambulatory Visit

## 2017-12-24 ENCOUNTER — Other Ambulatory Visit: Payer: Self-pay | Admitting: Hematology and Oncology

## 2017-12-24 ENCOUNTER — Encounter: Payer: Self-pay | Admitting: Hematology and Oncology

## 2017-12-24 DIAGNOSIS — R161 Splenomegaly, not elsewhere classified: Secondary | ICD-10-CM | POA: Diagnosis not present

## 2017-12-24 DIAGNOSIS — R768 Other specified abnormal immunological findings in serum: Secondary | ICD-10-CM | POA: Diagnosis not present

## 2017-12-24 DIAGNOSIS — R509 Fever, unspecified: Secondary | ICD-10-CM | POA: Diagnosis not present

## 2017-12-24 DIAGNOSIS — D696 Thrombocytopenia, unspecified: Secondary | ICD-10-CM | POA: Diagnosis not present

## 2017-12-24 DIAGNOSIS — Z6824 Body mass index (BMI) 24.0-24.9, adult: Secondary | ICD-10-CM | POA: Diagnosis not present

## 2017-12-24 DIAGNOSIS — M791 Myalgia, unspecified site: Secondary | ICD-10-CM | POA: Diagnosis not present

## 2017-12-24 DIAGNOSIS — R7982 Elevated C-reactive protein (CRP): Secondary | ICD-10-CM | POA: Diagnosis not present

## 2017-12-24 DIAGNOSIS — M2550 Pain in unspecified joint: Secondary | ICD-10-CM | POA: Diagnosis not present

## 2017-12-24 DIAGNOSIS — D8989 Other specified disorders involving the immune mechanism, not elsewhere classified: Secondary | ICD-10-CM | POA: Diagnosis not present

## 2017-12-24 DIAGNOSIS — R5383 Other fatigue: Secondary | ICD-10-CM | POA: Diagnosis not present

## 2017-12-25 ENCOUNTER — Encounter: Payer: Self-pay | Admitting: Hematology and Oncology

## 2017-12-25 ENCOUNTER — Telehealth: Payer: Self-pay | Admitting: Hematology and Oncology

## 2017-12-25 NOTE — Telephone Encounter (Signed)
Spoke to patient regarding upcoming march appointments per 3/19 sch message.

## 2017-12-26 ENCOUNTER — Other Ambulatory Visit: Payer: Self-pay

## 2017-12-26 ENCOUNTER — Ambulatory Visit (HOSPITAL_COMMUNITY)
Admission: RE | Admit: 2017-12-26 | Discharge: 2017-12-26 | Disposition: A | Discharge status: Home / Self Care | Payer: 59 | Source: Ambulatory Visit | Attending: Family Medicine | Admitting: Family Medicine

## 2017-12-26 DIAGNOSIS — I5189 Other ill-defined heart diseases: Secondary | ICD-10-CM | POA: Insufficient documentation

## 2017-12-26 DIAGNOSIS — R011 Cardiac murmur, unspecified: Secondary | ICD-10-CM | POA: Diagnosis not present

## 2017-12-26 DIAGNOSIS — D509 Iron deficiency anemia, unspecified: Secondary | ICD-10-CM | POA: Diagnosis not present

## 2017-12-26 DIAGNOSIS — R05 Cough: Secondary | ICD-10-CM | POA: Diagnosis not present

## 2017-12-26 DIAGNOSIS — D696 Thrombocytopenia, unspecified: Secondary | ICD-10-CM

## 2017-12-26 DIAGNOSIS — R509 Fever, unspecified: Secondary | ICD-10-CM | POA: Insufficient documentation

## 2017-12-26 DIAGNOSIS — Z7689 Persons encountering health services in other specified circumstances: Secondary | ICD-10-CM | POA: Diagnosis not present

## 2017-12-26 MED ORDER — LORAZEPAM 1 MG PO TABS: 1.0000 mg | ORAL_TABLET | Freq: Once | ORAL | Status: DC

## 2017-12-26 MED ORDER — OXYCODONE-ACETAMINOPHEN 5-325 MG PO TABS: 1.0000 | ORAL_TABLET | Freq: Once | ORAL | Status: DC

## 2017-12-26 NOTE — Progress Notes (Signed)
  Echocardiogram 2D Echocardiogram has been performed.  Emily Foley 12/26/2017, 12:06 PM

## 2017-12-27 ENCOUNTER — Telehealth: Payer: Self-pay | Admitting: Hematology and Oncology

## 2017-12-27 ENCOUNTER — Ambulatory Visit (HOSPITAL_COMMUNITY): Payer: 59

## 2017-12-27 ENCOUNTER — Inpatient Hospital Stay: Payer: 59

## 2017-12-27 ENCOUNTER — Ambulatory Visit (INDEPENDENT_AMBULATORY_CARE_PROVIDER_SITE_OTHER): Payer: 59 | Admitting: Family

## 2017-12-27 ENCOUNTER — Encounter: Payer: Self-pay | Admitting: Family

## 2017-12-27 ENCOUNTER — Encounter (HOSPITAL_COMMUNITY): Payer: 59

## 2017-12-27 ENCOUNTER — Inpatient Hospital Stay (HOSPITAL_BASED_OUTPATIENT_CLINIC_OR_DEPARTMENT_OTHER): Payer: 59 | Admitting: Hematology and Oncology

## 2017-12-27 VITALS — BP 119/70 | HR 112 | Temp 98.3°F | Ht 62.5 in | Wt 132.8 lb

## 2017-12-27 VITALS — BP 128/70 | HR 104 | Temp 98.4°F

## 2017-12-27 DIAGNOSIS — D696 Thrombocytopenia, unspecified: Secondary | ICD-10-CM

## 2017-12-27 DIAGNOSIS — D509 Iron deficiency anemia, unspecified: Secondary | ICD-10-CM

## 2017-12-27 DIAGNOSIS — D709 Neutropenia, unspecified: Secondary | ICD-10-CM | POA: Diagnosis not present

## 2017-12-27 DIAGNOSIS — R161 Splenomegaly, not elsewhere classified: Secondary | ICD-10-CM

## 2017-12-27 DIAGNOSIS — D7589 Other specified diseases of blood and blood-forming organs: Secondary | ICD-10-CM | POA: Diagnosis not present

## 2017-12-27 DIAGNOSIS — R509 Fever, unspecified: Secondary | ICD-10-CM | POA: Insufficient documentation

## 2017-12-27 DIAGNOSIS — D61818 Other pancytopenia: Secondary | ICD-10-CM | POA: Diagnosis not present

## 2017-12-27 LAB — IRON AND TIBC
Iron: 28 ug/dL — ABNORMAL LOW (ref 41–142)
Saturation Ratios: 11 % — ABNORMAL LOW (ref 21–57)
TIBC: 244 ug/dL (ref 236–444)
UIBC: 216 ug/dL

## 2017-12-27 LAB — CMP (CANCER CENTER ONLY)
ALT: 13 U/L (ref 0–55)
AST: 23 U/L (ref 5–34)
Albumin: 3.5 g/dL (ref 3.5–5.0)
Alkaline Phosphatase: 101 U/L (ref 40–150)
Anion gap: 9 (ref 3–11)
BUN: 21 mg/dL (ref 7–26)
CO2: 23 mmol/L (ref 22–29)
Calcium: 8.9 mg/dL (ref 8.4–10.4)
Chloride: 103 mmol/L (ref 98–109)
Creatinine: 0.87 mg/dL (ref 0.60–1.10)
GFR, Est AFR Am: >60 mL/min (ref >60)
GFR, Estimated: >60 mL/min (ref >60)
Glucose, Bld: 115 mg/dL (ref 70–140)
Potassium: 4.2 mmol/L (ref 3.5–5.1)
Sodium: 135 mmol/L — ABNORMAL LOW (ref 136–145)
Total Bilirubin: 1 mg/dL (ref 0.2–1.2)
Total Protein: 6.2 g/dL — ABNORMAL LOW (ref 6.4–8.3)

## 2017-12-27 LAB — CBC WITH DIFFERENTIAL (CANCER CENTER ONLY)
Basophils Absolute: 0 10*3/uL (ref 0.0–0.1)
Basophils Relative: 1 %
Eosinophils Absolute: 0 10*3/uL (ref 0.0–0.5)
Eosinophils Relative: 1 %
HCT: 22.6 % — ABNORMAL LOW (ref 34.8–46.6)
Hemoglobin: 7.2 g/dL — ABNORMAL LOW (ref 11.6–15.9)
Lymphocytes Relative: 24 %
Lymphs Abs: 0.6 10*3/uL — ABNORMAL LOW (ref 0.9–3.3)
MCH: 23.6 pg — ABNORMAL LOW (ref 25.1–34.0)
MCHC: 32.1 g/dL (ref 31.5–36.0)
MCV: 73.7 fL — ABNORMAL LOW (ref 79.5–101.0)
Monocytes Absolute: 0.3 10*3/uL (ref 0.1–0.9)
Monocytes Relative: 13 %
Neutro Abs: 1.6 10*3/uL (ref 1.5–6.5)
Neutrophils Relative %: 61 %
Platelet Count: 152 10*3/uL (ref 145–400)
RBC: 3.06 MIL/uL — ABNORMAL LOW (ref 3.70–5.45)
RDW: 16.7 % — ABNORMAL HIGH (ref 11.2–14.5)
WBC Count: 2.6 10*3/uL — ABNORMAL LOW (ref 3.9–10.3)

## 2017-12-27 LAB — FERRITIN: Ferritin: 474 ng/mL — ABNORMAL HIGH (ref 9–269)

## 2017-12-27 MED ORDER — OXYCODONE-ACETAMINOPHEN 5-325 MG PO TABS
1.0000 | ORAL_TABLET | Freq: Once | ORAL | Status: AC
  Administered 2017-12-27: 1 via ORAL

## 2017-12-27 MED ORDER — LORAZEPAM 1 MG PO TABS
1.0000 mg | ORAL_TABLET | Freq: Once | ORAL | Status: AC
  Administered 2017-12-27: 1 mg via ORAL

## 2017-12-27 MED ORDER — LORAZEPAM 1 MG PO TABS
ORAL_TABLET | ORAL | Status: AC
  Filled 2017-12-27: qty 1

## 2017-12-27 MED ORDER — SODIUM CHLORIDE 0.9 % IV SOLN
Freq: Once | INTRAVENOUS | Status: AC
  Administered 2017-12-27: 14:00:00 via INTRAVENOUS

## 2017-12-27 MED ORDER — OXYCODONE-ACETAMINOPHEN 5-325 MG PO TABS
ORAL_TABLET | ORAL | Status: AC
  Filled 2017-12-27: qty 1

## 2017-12-27 MED ORDER — SODIUM CHLORIDE 0.9 % IV SOLN
510.0000 mg | Freq: Once | INTRAVENOUS | Status: AC
  Administered 2017-12-27: 510 mg via INTRAVENOUS
  Filled 2017-12-27: qty 17

## 2017-12-27 NOTE — Patient Instructions (Addendum)
Nice to see you.  We will check your blood cultures and CMV today.  We will await the results of the other tests to determine need for potential antibiotics.   If you symptoms worsen please let us know.  If you develop sharp abdominal pain seek emergency care.

## 2017-12-27 NOTE — Patient Instructions (Signed)

## 2017-12-27 NOTE — Assessment & Plan Note (Signed)
Fevers of unspecified source with concern for infection or malignancy. Autoimmune remains a possibility, however based on rheumatology assessment it does appear likely. She does have pancytopenia which is concerning for bone marrow involvement and certainly agree with bone marrow biopsy. We will obtain CMV, Quantiferon Gold and blood cultures today. EBV pending via Gastroenterology and bone marrow biopsy pending via Hematology. Can also consider a splenic abscess. Will hold off imaging pending lab work results. No treatment necessary at this time. Could consider treatment of encapsulated bacteria given potential for spleen dysfunction, however would have anticipated more rapid progression of infective symptoms. Follow up pending blood work and bone marrow results. Patient asked about being treated for H. Pylori and allergies which should be fine.

## 2017-12-27 NOTE — Patient Instructions (Signed)
Bone Marrow Aspiration and Bone Marrow Biopsy, Adult, Care After This sheet gives you information about how to care for yourself after your procedure. Your health care provider may also give you more specific instructions. If you have problems or questions, contact your health care provider. What can I expect after the procedure? After the procedure, it is common to have:  Mild pain and tenderness.  Swelling.  Bruising.  Follow these instructions at home:  Take over-the-counter or prescription medicines only as told by your health care provider.  Do not take baths, swim, or use a hot tub until your health care provider approves. Ask if you can take a shower or have a sponge bath.  Follow instructions from your health care provider about how to take care of the puncture site. Make sure you: ? Wash your hands with soap and water before you change your bandage (dressing). If soap and water are not available, use hand sanitizer. ? Change your dressing as told by your health care provider.  Check your puncture siteevery day for signs of infection. Check for: ? More redness, swelling, or pain. ? More fluid or blood. ? Warmth. ? Pus or a bad smell.  Return to your normal activities as told by your health care provider. Ask your health care provider what activities are safe for you.  Do not drive for 24 hours if you were given a medicine to help you relax (sedative).  Keep all follow-up visits as told by your health care provider. This is important. Contact a health care provider if:  You have more redness, swelling, or pain around the puncture site.  You have more fluid or blood coming from the puncture site.  Your puncture site feels warm to the touch.  You have pus or a bad smell coming from the puncture site.  You have a fever.  Your pain is not controlled with medicine. This information is not intended to replace advice given to you by your health care provider. Make sure  you discuss any questions you have with your health care provider. Document Released: 04/13/2005 Document Revised: 04/13/2016 Document Reviewed: 03/07/2016 Elsevier Interactive Patient Education  2018 [employer].

## 2017-12-27 NOTE — Telephone Encounter (Signed)
Left message for patient regarding upcoming march appointments per 3/22 sch message.

## 2017-12-27 NOTE — Progress Notes (Signed)
Subjective:    Patient ID: Emily Foley, female    DOB: 03-Nov-1955, 62 y.o.   MRN: [id number]  Chief Complaint  Patient presents with  . Fever    night sweats, temp 100.4 at 5am takes tylenol frequently    HPI:  Emily Foley is a 62 y.o. female with a PMH of splenomegaly, iron deficiency anemia, and splenomegaly presenting today for evaluation of fevers.   Emily Foley is under the care of Dr. Lindi Adie of Hematology for splenomeagly and iron deficiency anemia. Splenomegaly is believed to be related to sequestration or possible autoimmune process. She was seen by rheumatology with no autoimmune process that could be identified for her fevers and sent to ID for further evaluation.Fevers started around the week of March 4th. Fevers have ranged up to 100-102.2 and are responsive to Tylenol.  Previous blood work reviewed from 09/10/17 was significant for an elevated CRP of 20.5 and decreased iron saturation, but otherwise within the normal ranges. She completed an echocardiogram yesterday with no abnormalities or vegetations. A bone marrow biopsy was completed today and is pending. She presented additional blood work showing a pancytopenia that appears to be developing. Gastroenterology has ordered an EBV to rule out mono.  She does have chills and has a cough for a couple of weeks. A chest x-ray done on 3/15 with no cardiopulmonary process. Splenomegaly has been going on for about 1 year. There was concern for an enlarged liver when she was last seen by Rheumatology. She has not had any additional imaging of her lab work. She continues to have night sweats.   Allergies  Allergen Reactions  . Biaxin [Clarithromycin] Diarrhea      Outpatient Medications Prior to Visit  Medication Sig Dispense Refill  . ferrous sulfate 325 (65 FE) MG EC tablet Take 1 tablet (325 mg total) by mouth daily with breakfast. 30 tablet 3  . chlorpheniramine-HYDROcodone (TUSSIONEX PENNKINETIC ER) 10-8 MG/5ML LQCR Take 5  mLs by mouth every 12 (twelve) hours as needed for cough (cough). (Patient not taking: Reported on 12/27/2017) 100 mL 0  . ipratropium (ATROVENT) 0.03 % nasal spray Place 2 sprays into both nostrils 2 (two) times daily. (Patient not taking: Reported on 12/27/2017) 30 mL 0  . losartan (COZAAR) 50 MG tablet Take 50 mg by mouth daily.     No facility-administered medications prior to visit.      Past Medical History:  Diagnosis Date  . Arthritis   . Hypertension       History reviewed. No pertinent surgical history.    Family History  Problem Relation Age of Onset  . Diabetes Mother   . Hyperlipidemia Mother   . Hypertension Mother       Social History   Socioeconomic History  . Marital status: Single    Spouse name: Not on file  . Number of children: Not on file  . Years of education: Not on file  . Highest education level: Not on file  Occupational History  . Not on file  Social Needs  . Financial resource strain: Not on file  . Food insecurity:    Worry: Not on file    Inability: Not on file  . Transportation needs:    Medical: Not on file    Non-medical: Not on file  Tobacco Use  . Smoking status: Never Smoker  . Smokeless tobacco: Never Used  Substance and Sexual Activity  . Alcohol use: Not on file  . Drug use: Not  on file  . Sexual activity: Not on file  Lifestyle  . Physical activity:    Days per week: Not on file    Minutes per session: Not on file  . Stress: Not on file  Relationships  . Social connections:    Talks on phone: Not on file    Gets together: Not on file    Attends religious service: Not on file    Active member of club or organization: Not on file    Attends meetings of clubs or organizations: Not on file    Relationship status: Not on file  . Intimate partner violence:    Fear of current or ex partner: Not on file    Emotionally abused: Not on file    Physically abused: Not on file    Forced sexual activity: Not on file    Other Topics Concern  . Not on file  Social History Narrative  . Not on file      Review of Systems  Constitutional: Positive for chills and fever. Negative for appetite change, diaphoresis and fatigue.  Respiratory: Negative for chest tightness, shortness of breath and wheezing.   Cardiovascular: Negative for chest pain.  Gastrointestinal: Negative for abdominal pain, constipation, diarrhea, nausea and vomiting.  Genitourinary: Negative for dysuria, flank pain, frequency and menstrual problem.  Hematological: Negative for adenopathy. Does not bruise/bleed easily.       Objective:    BP 119/70 (BP Location: Left Arm, Patient Position: Sitting, Cuff Size: Normal)   Pulse (!) 112   Temp 98.3 F (36.8 C) (Oral)   Ht 5' 2.5" (1.588 m)   Wt 132 lb 12 oz (60.2 kg)   BMI 23.89 kg/m  Nursing note and vital signs reviewed.  Physical Exam  Constitutional: She is oriented to person, place, and time. She appears well-developed and well-nourished. No distress.  Neck: Neck supple.  Cardiovascular: Normal rate, regular rhythm, normal heart sounds and intact distal pulses. Exam reveals no gallop and no friction rub.  No murmur heard. Pulmonary/Chest: Effort normal and breath sounds normal. No respiratory distress. She has no wheezes. She has no rales. She exhibits no tenderness.  Abdominal: Soft. There is hepatosplenomegaly. There is no tenderness. There is no rigidity, no rebound, no guarding, no CVA tenderness, no tenderness at McBurney's point and negative Murphy's sign. No hernia. Hernia confirmed negative in the ventral area.  Lymphadenopathy:    She has no cervical adenopathy.  Neurological: She is alert and oriented to person, place, and time.  Skin: Skin is warm and dry.  Psychiatric: She has a normal mood and affect. Her behavior is normal. Judgment and thought content normal.        Assessment & Plan:   Problem List Items Addressed This Visit      Other   Splenomegaly     Splenomegaly followed by hematology and gastroenterology and likely related to sequestration. There is possibility of infectious source which is being tested for with CMV and EBV. Can also consider a splenic abscess. Will hold off treatments pending blood work and bone marrow results.       Relevant Orders   Culture, blood (single)   Culture, blood (single)   CMV IgM   QuantiFERON-TB Gold Plus   Fever - Primary    Fevers of unspecified source with concern for infection or malignancy. Autoimmune remains a possibility, however based on rheumatology assessment it does appear likely. She does have pancytopenia which is concerning for bone marrow involvement and  certainly agree with bone marrow biopsy. We will obtain CMV, Quantiferon Gold and blood cultures today. EBV pending via Gastroenterology and bone marrow biopsy pending via Hematology. Can also consider a splenic abscess. Will hold off imaging pending lab work results. No treatment necessary at this time. Could consider treatment of encapsulated bacteria given potential for spleen dysfunction, however would have anticipated more rapid progression of infective symptoms. Follow up pending blood work and bone marrow results. Patient asked about being treated for H. Pylori and allergies which should be fine.       Relevant Orders   Culture, blood (single)   Culture, blood (single)   CMV IgM   QuantiFERON-TB Gold Plus       I am having Sharman L. Heber maintain her losartan, ipratropium, chlorpheniramine-HYDROcodone, and ferrous sulfate.   Follow-up: Return in about 3 weeks (around 01/17/2018), or if symptoms worsen or fail to improve.  Mauricio Po, [facility]

## 2017-12-27 NOTE — Progress Notes (Signed)
INDICATION: Splenomegaly, neutropenia, thrombocytopenia, anemia Premedications: Percocet 5 mg, Ativan 1 mg Brief examination was performed. ENT: adequate airway clearance Heart: regular rate and rhythm.No Murmurs Lungs: clear to auscultation, no wheezes, normal respiratory effort  Bone Marrow Biopsy and Aspiration Procedure Note   Informed consent was obtained and potential risks including bleeding, infection and pain were reviewed with the patient.  The patient's name, date of birth, identification, consent and allergies were verified prior to the start of procedure and time out was performed.  The left posterior iliac crest was chosen as the site of biopsy.  The skin was prepped with ChloraPrep.   8 cc of 1% lidocaine was used to provide local anaesthesia.   10 cc of bone marrow aspirate was obtained followed by 1cm biopsy.  Pressure was applied to the biopsy site and bandage was placed over the biopsy site. Patient was made to lie on the back for 30 mins prior to discharge.  The procedure was tolerated well. COMPLICATIONS: None BLOOD LOSS: none The patient was discharged home in stable condition with a 1 week follow up to review results.  Patient was provided with post bone marrow biopsy instructions and instructed to call if there was any bleeding or worsening pain.  Specimens sent for flow cytometry, cytogenetics and additional studies. Return to clinic next week to review the results Signed Harriette Ohara, MD

## 2017-12-27 NOTE — Assessment & Plan Note (Signed)
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 There is possibility of infectious source which is being tested for with CMV and EBV. Can also consider a splenic abscess. Will hold off treatments pending blood work and bone marrow results.

## 2017-12-28 LAB — QUANTIFERON-TB GOLD PLUS
Mitogen-NIL: 3.06 IU/mL
NIL: 0.12 IU/mL
QuantiFERON-TB Gold Plus: NEGATIVE
TB1-NIL: <0 IU/mL
TB2-NIL: <0 IU/mL

## 2017-12-30 ENCOUNTER — Other Ambulatory Visit: Payer: Self-pay | Admitting: Hematology and Oncology

## 2017-12-30 ENCOUNTER — Encounter: Payer: Self-pay | Admitting: Family

## 2017-12-30 ENCOUNTER — Telehealth: Payer: Self-pay | Admitting: Hematology and Oncology

## 2017-12-30 ENCOUNTER — Telehealth: Payer: Self-pay

## 2017-12-30 ENCOUNTER — Other Ambulatory Visit: Payer: 59

## 2017-12-30 ENCOUNTER — Inpatient Hospital Stay: Payer: 59

## 2017-12-30 ENCOUNTER — Encounter: Payer: Self-pay | Admitting: Hematology and Oncology

## 2017-12-30 DIAGNOSIS — D709 Neutropenia, unspecified: Secondary | ICD-10-CM | POA: Diagnosis not present

## 2017-12-30 DIAGNOSIS — D509 Iron deficiency anemia, unspecified: Secondary | ICD-10-CM | POA: Diagnosis not present

## 2017-12-30 DIAGNOSIS — R161 Splenomegaly, not elsewhere classified: Secondary | ICD-10-CM

## 2017-12-30 DIAGNOSIS — D696 Thrombocytopenia, unspecified: Secondary | ICD-10-CM

## 2017-12-30 DIAGNOSIS — D61818 Other pancytopenia: Secondary | ICD-10-CM | POA: Diagnosis not present

## 2017-12-30 DIAGNOSIS — R509 Fever, unspecified: Secondary | ICD-10-CM | POA: Diagnosis not present

## 2017-12-30 LAB — CBC WITH DIFFERENTIAL (CANCER CENTER ONLY)
Basophils Absolute: 0 10*3/uL (ref 0.0–0.1)
Basophils Relative: 0 %
Eosinophils Absolute: 0 10*3/uL (ref 0.0–0.5)
Eosinophils Relative: 1 %
HCT: 24.4 % — ABNORMAL LOW (ref 34.8–46.6)
Hemoglobin: 7.6 g/dL — ABNORMAL LOW (ref 11.6–15.9)
Lymphocytes Relative: 25 %
Lymphs Abs: 0.9 10*3/uL (ref 0.9–3.3)
MCH: 23.9 pg — ABNORMAL LOW (ref 25.1–34.0)
MCHC: 31.1 g/dL — ABNORMAL LOW (ref 31.5–36.0)
MCV: 76.7 fL — ABNORMAL LOW (ref 79.5–101.0)
Monocytes Absolute: 0.3 10*3/uL (ref 0.1–0.9)
Monocytes Relative: 9 %
Neutro Abs: 2.3 10*3/uL (ref 1.5–6.5)
Neutrophils Relative %: 65 %
Platelet Count: 164 10*3/uL (ref 145–400)
RBC: 3.18 MIL/uL — ABNORMAL LOW (ref 3.70–5.45)
RDW: 17 % — ABNORMAL HIGH (ref 11.2–14.5)
WBC Count: 3.5 10*3/uL — ABNORMAL LOW (ref 3.9–10.3)

## 2017-12-30 LAB — RETICULOCYTES
RBC.: 3.18 MIL/uL — ABNORMAL LOW (ref 3.70–5.45)
Retic Count, Absolute: 85.9 10*3/uL (ref 33.7–90.7)
Retic Ct Pct: 2.7 % — ABNORMAL HIGH (ref 0.7–2.1)

## 2017-12-30 LAB — DIRECT ANTIGLOBULIN TEST (NOT AT ARMC)
DAT, IgG: NEGATIVE
DAT, complement: NEGATIVE

## 2017-12-30 LAB — SAMPLE TO BLOOD BANK

## 2017-12-30 LAB — LACTATE DEHYDROGENASE: LDH: 616 U/L — ABNORMAL HIGH (ref 125–245)

## 2017-12-30 NOTE — Telephone Encounter (Signed)
I discussed with the patient that we would like to her to come back for a recheck of her CBC along with workup for hemolysis.  Patient will come this afternoon.  If her anemia is markedly worse with the hemoglobin drops below 7 and she is very symptomatic then we may consider giving her blood transfusion.

## 2017-12-30 NOTE — Telephone Encounter (Signed)
Received call from lab, hemoglobin level is 7.6.  Notified Dr Lindi Adie.  No orders received at this time.

## 2017-12-30 NOTE — Telephone Encounter (Signed)
Per Dr Lindi Adie - schedule pt for lab only today after 2 pm- notified pt and she prefers 12:30 today instead- scheduled for her.  No other needs per pt.

## 2017-12-30 NOTE — Telephone Encounter (Signed)
I called the patient and inform her the results of the lab work. Anemia is stable as well as a white blood cell count.  Elevated LDH suggests underlying lymphoma.  I will call the patient as soon as I get the bone marrow biopsy results.

## 2017-12-31 ENCOUNTER — Telehealth: Payer: Self-pay | Admitting: *Deleted

## 2017-12-31 ENCOUNTER — Telehealth: Payer: Self-pay

## 2017-12-31 LAB — CMV IGM: CMV IgM: <30 AU/mL

## 2017-12-31 LAB — HAPTOGLOBIN: Haptoglobin: <10 mg/dL — ABNORMAL LOW (ref 34–200)

## 2017-12-31 NOTE — Telephone Encounter (Signed)
"  Dr. Lindi Adie told me to expect a call this morning for an appointment with Dr. Irene Limbo.  Have not heard anything yet.  He was to finish looking at some slides "  No appointment scheduling request noted at this time.  Provider awaiting Bone Marrow slides.  Denies any further questions or needs at this time.

## 2017-12-31 NOTE — Telephone Encounter (Signed)
Notified pt of appt tomorrow at 1100. Pt verbalized thanks for the update and understanding to ignore anything scheduled for Thursday.

## 2018-01-01 ENCOUNTER — Telehealth: Payer: Self-pay

## 2018-01-01 ENCOUNTER — Encounter: Payer: Self-pay | Admitting: Hematology

## 2018-01-01 ENCOUNTER — Inpatient Hospital Stay (HOSPITAL_BASED_OUTPATIENT_CLINIC_OR_DEPARTMENT_OTHER): Payer: 59 | Admitting: Hematology

## 2018-01-01 ENCOUNTER — Ambulatory Visit: Payer: 59 | Admitting: Hematology

## 2018-01-01 ENCOUNTER — Inpatient Hospital Stay: Payer: 59

## 2018-01-01 VITALS — BP 139/64 | HR 118 | Temp 99.1°F | Resp 18 | Ht 62.5 in | Wt 130.5 lb

## 2018-01-01 DIAGNOSIS — D509 Iron deficiency anemia, unspecified: Secondary | ICD-10-CM

## 2018-01-01 DIAGNOSIS — C8580 Other specified types of non-Hodgkin lymphoma, unspecified site: Secondary | ICD-10-CM

## 2018-01-01 DIAGNOSIS — D508 Other iron deficiency anemias: Secondary | ICD-10-CM

## 2018-01-01 DIAGNOSIS — R161 Splenomegaly, not elsewhere classified: Secondary | ICD-10-CM | POA: Diagnosis not present

## 2018-01-01 DIAGNOSIS — D696 Thrombocytopenia, unspecified: Secondary | ICD-10-CM | POA: Diagnosis not present

## 2018-01-01 DIAGNOSIS — R740 Nonspecific elevation of levels of transaminase and lactic acid dehydrogenase [LDH]: Secondary | ICD-10-CM

## 2018-01-01 DIAGNOSIS — R7402 Elevation of levels of lactic acid dehydrogenase (LDH): Secondary | ICD-10-CM

## 2018-01-01 DIAGNOSIS — D709 Neutropenia, unspecified: Secondary | ICD-10-CM | POA: Diagnosis not present

## 2018-01-01 DIAGNOSIS — R509 Fever, unspecified: Secondary | ICD-10-CM | POA: Diagnosis not present

## 2018-01-01 DIAGNOSIS — D599 Acquired hemolytic anemia, unspecified: Secondary | ICD-10-CM

## 2018-01-01 DIAGNOSIS — D61818 Other pancytopenia: Secondary | ICD-10-CM | POA: Diagnosis not present

## 2018-01-01 LAB — CBC WITH DIFFERENTIAL (CANCER CENTER ONLY)
Basophils Absolute: 0 10*3/uL (ref 0.0–0.1)
Basophils Relative: 1 %
Eosinophils Absolute: 0 10*3/uL (ref 0.0–0.5)
Eosinophils Relative: 0 %
HCT: 23 % — ABNORMAL LOW (ref 34.8–46.6)
Hemoglobin: 7.1 g/dL — ABNORMAL LOW (ref 11.6–15.9)
Lymphocytes Relative: 25 %
Lymphs Abs: 0.7 10*3/uL — ABNORMAL LOW (ref 0.9–3.3)
MCH: 24 pg — ABNORMAL LOW (ref 25.1–34.0)
MCHC: 30.9 g/dL — ABNORMAL LOW (ref 31.5–36.0)
MCV: 77.7 fL — ABNORMAL LOW (ref 79.5–101.0)
Monocytes Absolute: 0.2 10*3/uL (ref 0.1–0.9)
Monocytes Relative: 9 %
Neutro Abs: 1.8 10*3/uL (ref 1.5–6.5)
Neutrophils Relative %: 65 %
Platelet Count: 145 10*3/uL (ref 145–400)
RBC: 2.96 MIL/uL — ABNORMAL LOW (ref 3.70–5.45)
RDW: 18 % — ABNORMAL HIGH (ref 11.2–14.5)
WBC Count: 2.8 10*3/uL — ABNORMAL LOW (ref 3.9–10.3)

## 2018-01-01 LAB — ANTIBODY SCREEN: Antibody Screen: NEGATIVE

## 2018-01-01 LAB — URINALYSIS, COMPLETE (UACMP) WITH MICROSCOPIC
Bacteria, UA: NONE SEEN
Bilirubin Urine: NEGATIVE
Glucose, UA: NEGATIVE mg/dL
Hgb urine dipstick: NEGATIVE
Ketones, ur: NEGATIVE mg/dL
Nitrite: NEGATIVE
Protein, ur: NEGATIVE mg/dL
Specific Gravity, Urine: 1.02 (ref 1.005–1.030)
pH: 5 (ref 5.0–8.0)

## 2018-01-01 LAB — DIRECT ANTIGLOBULIN TEST (NOT AT ARMC)
DAT, IgG: NEGATIVE
DAT, complement: NEGATIVE

## 2018-01-01 LAB — RETICULOCYTES
RBC.: 2.96 MIL/uL — ABNORMAL LOW (ref 3.70–5.45)
Retic Count, Absolute: 121.4 10*3/uL — ABNORMAL HIGH (ref 33.7–90.7)
Retic Ct Pct: 4.1 % — ABNORMAL HIGH (ref 0.7–2.1)

## 2018-01-01 LAB — CMP (CANCER CENTER ONLY)
ALT: 15 U/L (ref 0–55)
AST: 21 U/L (ref 5–34)
Albumin: 3.7 g/dL (ref 3.5–5.0)
Alkaline Phosphatase: 91 U/L (ref 40–150)
Anion gap: 9 (ref 3–11)
BUN: 20 mg/dL (ref 7–26)
CO2: 24 mmol/L (ref 22–29)
Calcium: 9.2 mg/dL (ref 8.4–10.4)
Chloride: 104 mmol/L (ref 98–109)
Creatinine: 0.83 mg/dL (ref 0.60–1.10)
GFR, Est AFR Am: >60 mL/min (ref >60)
GFR, Estimated: >60 mL/min (ref >60)
Glucose, Bld: 110 mg/dL (ref 70–140)
Potassium: 4.7 mmol/L (ref 3.5–5.1)
Sodium: 137 mmol/L (ref 136–145)
Total Bilirubin: 1 mg/dL (ref 0.2–1.2)
Total Protein: 6.5 g/dL (ref 6.4–8.3)

## 2018-01-01 LAB — SAVE SMEAR

## 2018-01-01 LAB — LACTATE DEHYDROGENASE: LDH: 591 U/L — ABNORMAL HIGH (ref 125–245)

## 2018-01-01 LAB — IRON AND TIBC
Iron: 39 ug/dL — ABNORMAL LOW (ref 41–142)
Saturation Ratios: 15 % — ABNORMAL LOW (ref 21–57)
TIBC: 266 ug/dL (ref 236–444)
UIBC: 227 ug/dL

## 2018-01-01 NOTE — Progress Notes (Signed)
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 She is accompanied today by three members of her family. The pt reports that she is doing well overall.   The pt notes that on March 6 she developed a low-grade fever and continues to have a fever. She has kept her fever at [place] with Tylenol every 6 hours. She notes that today she has felt the least feverish since March 6.  She saw her PCP Dr. Hulan Fess who ruled out influenza and UTI. She has also seen rheumatology over the last couple weeks and had a rheumatological workup that did not show overt evidence of a primary autoimmune condition. She was noted to be neg for ANA, RF and CCP Ab. Interesting was noted to have low C4 levels.  She notes that her resting HR has been elevated and describes her heart rate as tachycardic. She had an ECHO on 12/26/17 which showed a normal EF, and was then referred to see infectious disease: CMV and TB Quantiferon were negative.  She notes that the ID team told her there was no clear evidence of an infectious process. TTE no vegetation. BL Bcx neg.  She notes that last week she developed a cough, but believes it to be allergy related. She notes that she has had an enlarged spleen since at least October 2018 revealed by a 07/31/17 US Abdomen. She notes some abdominal discomfort, and has felt that she gets full quickly after eating.  In the last year she intentionally dropped about 20 lbs of weight with change in her diet and avoiding most carbs. She notes that in the last 3 weeks, since she began feeling  ill this month, she has lost about 6-7 lbs.   She also notes that she has been having night sweats that have returned this month after a few months of night sweats last year which abated in August 2018. She notes no other signs of recent infection, and denies any travel outside of the country. She denies any concern of insect bites.   She notes that in [place] she was on iron pills for low Hgb, which increased her Hgb. She stopped taking after her Hgb normalized.  She notes that in March 2018 she had a bad stomach bug and in December 2017 she had influenza.  She notes that she has been under a lot of stress with the deaths of her mother and step-father in a couple weeks of one another.   The pt reports that she has seen rheumatology twice for her hand, knee and hip pain but was confirmed to have osteoarthritis as opposed to rheumatoid arthritis. She notes that the severity of her joint pains mirror the content of her diet. She notes that her joint have never been red or swollen.   She notes that she will be having an iron infusion on 01/03/18.  Of note prior to the patient's visit today, pt has had BM Bx completed on 12/27/17 with results revealing HYPERCELLULAR BONE MARROW FOR AGE WITH NUMEROUS ATYPICAL LYMPHOID AGGREGATES. - TRILINEAGE HEMATOPOIESIS. On 12/27/17 the patient's flow cytometry revealed A MINOR  MONOCLONAL B-CELL POPULATION IDENTIFIED.  Flow cytometric analysis shows a minor population of monoclonal B cells (17% of all lymphocytes) expressing pan B-cell antigens including CD20 with lack of CD5, CD10, CD25 or CD103 expression. Immunohistochemical stains were also performed and show that the lymphoid aggregates show a mixture of T and B cells with slight predominance of T cells. No significant cyclin D1, CD34, or TdT positivity is identified. The overall findings are limited but slightly favor involvement by a B-cell lymphoproliferative process. Consideration was given to marginal  zone lymphoma and splenic lymphoma especially in the presence of splenomegaly. Nonetheless, additional material (ie lymph node or tissue biopsy) is strongly recommended for further evaluation.  I discussed the BM Bx results with Dr Gari Crown -- overall BM Bx + clinical picture certainly concerning for SMZL with possible coombs neg AIHA and possible autoimmune phenomenon related to [place]?Marland Kitchen  Most recent lab results (12/30/17) of CBC  is as follows: all values are WNL except for WBC at 3.5k, RBC at 3.18, Hgb at 7.6, HCT at 24.4, MCV at 76.7, MCH at 23.9, MCHC at 31.1, RDW at 17.0. LDH 12/30/17 was at 616. Haptoglobin<10 -- suggestive of hemolysis. C-reactive protein 09/10/17 was elevated at 20.5.   On review of systems, pt reports low-grade fever, night sweats, some fatigue, tachycardia, occasional lightheadedness, and denies dizziness, weakness, noticing any enlarged lymph nodes, changes in bowel habits, abdominal pains, abnormal vaginal discharge, and any other symptoms.   On PMHx the pt reports high blood pressure and degenerative arthritis. On Family Hx the pt reports her father had rheumatoid arthritis, her mother had kidney disease.  MEDICAL HISTORY:  Past Medical History:  Diagnosis Date  . Arthritis   . Hypertension     SURGICAL HISTORY: History reviewed. No pertinent surgical history.  SOCIAL HISTORY: Social History   Socioeconomic History  . Marital status: Single    Spouse name: Not on file  . Number of children: Not on file  . Years of education: Not on file  . Highest education level: Not on file  Occupational History  . Not on file  Social Needs  . Financial resource strain: Not on file  . Food insecurity:    Worry: Not on file    Inability: Not on file  . Transportation needs:    Medical: Not on file    Non-medical: Not on file  Tobacco Use  . Smoking status: Never Smoker  . Smokeless tobacco: Never Used  Substance and Sexual Activity  . Alcohol use: Not Currently      Alcohol/week: 0.0 oz  . Drug use: Never  . Sexual activity: Not on file  Lifestyle  . Physical activity:    Days per week: Not on file    Minutes per session: Not on file  . Stress: Not on file  Relationships  . Social connections:    Talks on phone: Not on file    Gets together: Not on file    Attends religious service: Not on file    Active member of club or organization: Not on file    Attends meetings of clubs or organizations: Not on file    Relationship status: Not on file  . Intimate partner violence:    Fear of current or ex partner: Not on file    Emotionally abused: Not on file    Physically abused: Not on file    Forced sexual activity: Not on file  Other Topics Concern  . Not on file  Social History  Narrative  . Not on file    FAMILY HISTORY: Family History  Problem Relation Age of Onset  . Diabetes Mother   . Hyperlipidemia Mother   . Hypertension Mother     ALLERGIES:  is allergic to biaxin [clarithromycin].  MEDICATIONS:  Current Outpatient Medications  Medication Sig Dispense Refill  . ferrous sulfate 325 (65 FE) MG EC tablet Take 1 tablet (325 mg total) by mouth daily with breakfast. 30 tablet 3   No current facility-administered medications for this visit.     REVIEW OF SYSTEMS:    10 Point review of Systems was done is negative except as noted above.  PHYSICAL EXAMINATION: ECOG PERFORMANCE STATUS: 1 - Symptomatic but completely ambulatory  . Vitals:   01/01/18 1109  BP: 139/64  Pulse: (!) 118  Resp: 18  Temp: 99.1 F (37.3 C)  SpO2: 100%   Filed Weights   01/01/18 1109  Weight: 130 lb 8 oz (59.2 kg)   .Body mass index is 23.49 kg/m.  GENERAL:alert, in no acute distress and comfortable SKIN: no acute rashes, no significant lesions EYES: conjunctiva are pink and non-injected, sclera anicteric OROPHARYNX: MMM, no exudates, no oropharyngeal erythema or ulceration NECK: supple, no JVD LYMPH:  no palpable lymphadenopathy in  the cervical, axillary or inguinal regions LUNGS: clear to auscultation b/l with normal respiratory effort HEART: regular rate & rhythm ABDOMEN:  normoactive bowel sounds , non tender, palpable splenomegaly.  Extremity: no pedal edema PSYCH: alert & oriented x 3 with fluent speech NEURO: no focal motor/sensory deficits  LABORATORY DATA:  I have reviewed the data as listed  . CBC Latest Ref Rng & Units 01/01/2018 12/30/2017 12/27/2017  WBC 3.9 - 10.3 K/uL 2.8(L) 3.5(L) 2.6(L)  Hemoglobin 11.6 - 15.9 g/dL - - -  Hematocrit 34.8 - 46.6 % 23.0(L) 24.4(L) 22.6(L)  Platelets 145 - 400 K/uL 145 164 152  HGB 7.1  . CMP Latest Ref Rng & Units 01/01/2018 12/27/2017 04/29/2017  Glucose 70 - 140 mg/dL 110 115 108  BUN 7 - 26 mg/dL 20 21 15.7  Creatinine 0.60 - 1.10 mg/dL 0.83 0.87 0.8  Sodium 136 - 145 mmol/L 137 135(L) 140  Potassium 3.5 - 5.1 mmol/L 4.7 4.2 4.5  Chloride 98 - 109 mmol/L 104 103 -  CO2 22 - 29 mmol/L 24 23 25   Calcium 8.4 - 10.4 mg/dL 9.2 8.9 9.5  Total Protein 6.4 - 8.3 g/dL 6.5 6.2(L) 6.7  Total Bilirubin 0.2 - 1.2 mg/dL 1.0 1.0 0.85  Alkaline Phos 40 - 150 U/L 91 101 94  AST 5 - 34 U/L 21 23 20   ALT 0 - 55 U/L 15 13 21    Component     Latest Ref Rng & Units 12/27/2017 01/01/2018  Color, Urine     YELLOW  YELLOW  Appearance     CLEAR  CLEAR  Specific Gravity, Urine     1.005 - 1.030  1.020  pH     5.0 - 8.0  5.0  Glucose     NEGATIVE mg/dL  NEGATIVE  Hgb urine dipstick     NEGATIVE  NEGATIVE  Bilirubin Urine     NEGATIVE  NEGATIVE  Ketones, ur     NEGATIVE mg/dL  NEGATIVE  Protein     NEGATIVE mg/dL  NEGATIVE  Nitrite     NEGATIVE  NEGATIVE  Leukocytes, UA     NEGATIVE  TRACE (A)  RBC / HPF     0 - 5 RBC/hpf  0-5  WBC, UA     0 - 5 WBC/hpf  0-5  Bacteria, UA     NONE SEEN  NONE SEEN  Squamous Epithelial / LPF     NONE SEEN  0-5 (A)  Mucus       PRESENT  Hyaline Casts, UA       PRESENT  IgG (Immunoglobin G), Serum     700 - 1,600 mg/dL  442 (L)   IgA     87 - 352 mg/dL  69 (L)  IgM (Immunoglobulin M), Srm     26 - 217 mg/dL  20 (L)  Total Protein ELP     6.0 - 8.5 g/dL  6.0  Albumin SerPl Elph-Mcnc     2.9 - 4.4 g/dL  3.7  Alpha 1     0.0 - 0.4 g/dL  0.4  Alpha2 Glob SerPl Elph-Mcnc     0.4 - 1.0 g/dL  0.6  B-Globulin SerPl Elph-Mcnc     0.7 - 1.3 g/dL  0.9  Gamma Glob SerPl Elph-Mcnc     0.4 - 1.8 g/dL  0.4  M Protein SerPl Elph-Mcnc     Not Observed g/dL  Not Observed  Globulin, Total     2.2 - 3.9 g/dL  2.3  Albumin/Glob SerPl     0.7 - 1.7  1.7  IFE 1       Comment  Please Note (HCV):       Comment  QuantiFERON-TB Gold Plus     NEGATIVE NEGATIVE   NIL     IU/mL 0.12   Mitogen-NIL     IU/mL 3.06   TB1-NIL     IU/mL <0.00   TB2-NIL     IU/mL <0.00   Iron     41 - 142 ug/dL 28 (L) 39 (L)  TIBC     236 - 444 ug/dL 244 266  Saturation Ratios     21 - 57 % 11 (L) 15 (L)  UIBC     ug/dL 216 227  Retic Ct Pct     0.7 - 2.1 %  4.1 (H)  RBC.     3.70 - 5.45 MIL/uL  2.96 (L)  Retic Count, Absolute     33.7 - 90.7 K/uL  121.4 (H)  C3 Complement     82 - 167 mg/dL  135  Complement C4, Body Fluid     14 - 44 mg/dL  <2 (L)  DAT, complement       NEG  DAT, IgG       NEG . . .  CMV IgM     AU/mL <30.00   Ferritin     9 - 269 ng/mL 474 (H)   Hemosiderin, Urine     Negative  Negative  Hep B Core Ab, Tot     Negative  Negative  Hepatitis B Surface Ag     Negative  Negative  HCV Ab     0.0 - 0.9 s/co ratio  <0.1  Haptoglobin     34 - 200 mg/dL  <10 (L)  LDH     125 - 245 U/L  591 (H)  Antibody Screen       NEG . . .     12/27/17 BM Bx:   12/27/17 Flow Cytometry:     RADIOGRAPHIC STUDIES: I have personally reviewed the radiological images as listed and agreed with the findings in the report. Dg Chest 2 View  Result Date: 12/20/2017 CLINICAL DATA:  Cough  and fever EXAM: CHEST - 2 VIEW COMPARISON:  None. FINDINGS: The heart size and mediastinal contours are within normal limits. Both lungs  are clear. The visualized skeletal structures are unremarkable. IMPRESSION: No active cardiopulmonary disease. Electronically Signed   By: Franchot Gallo M.D.   On: 12/20/2017 16:24    ASSESSMENT & PLAN:  62 y.o. female with  1. 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  Patient appears to be have significant constitutional symptoms   2. 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 Anemia - with elevated LDH and low haptoglobin concerning for hemolysis. Coombs neg . Could be IgA driven Coombs neg AIHA. +ve reticulocytosis and Smear with some microspherocytes. No over urine hemosiderinuria. Partly anemia could also be from hypersplenism.  She is not much symptomatic from her anemia at this time. 4. Low C4 ? Immune complex disease/autoimmunity related to lymphoma. Per rheumatology - no evidence of primary rheumatological disorder. No overt evidence of endocarditis. 5. Fevers/chills/night sweat -- likely constitutional symptoms related to ?lymphoma 6. Thrombocytopenia ?NSAIDS vs lymphoma vs other etiology. PLAN -reviewed extensive workup/labs and imaging done thus far -Discussed patient's most recent labs, WBC at 3.5k, Hgb at 7.6, MCV at 76.7, Platelets are at 164k. LDH elevated at 616. Haptoglobin less than 10.  -Reviewed the 12/27/17 BM Bx with pt and her family, revealing clonal B  Lymphocytes with a signature that might be consistent with SMZL. -Differential includes Splenic Marginal Zone Lymphoma or other NHL but other possibilities including inflammation and autoimmune processes are present.  -No lymphocytosis noted in the blood.  -Discussed that SMZL is a Non-Hodgkin's lymphoma and can cause autoimmune-like processes.  -Recommend PET/CT scan to evaluate for splenic activity levels and for other LNadenopathy. - scheduled for 01/08/2018. -Recommenedd B complex and Folic Acid supplement to support accelerated hematopoiesis -Pt is okay to  continue prn Tylenol as an antipyretic, but should avoid NSAIDs at this time. -Pending PET/CT results we will decide if a lymph node bx is necessary. -transfuse PRBC prn for symptomatic anemia or if hgb<7 -will plan to start patient on Prednisone and then Rituxan if SMZL confirmed per PET/CT findings.  Labs today PET/CT in 3-5 days [facility] or [facility] with Dr Irene Limbo in 7-9 days with labs and PET   All of the patients questions were answered with apparent satisfaction. The patient knows to call the clinic with any problems, questions or concerns.  I spent 50 minutes counseling the patient face to face. The total time spent in the appointment was 70 minutes and more than 50% was on counseling and direct patient cares.    Sullivan Lone MD [place] AAHIVMS [facility] Hematology/Oncology Physician [facility]  (Office):       [phone] (Work cell):  [phone] (Fax):           [phone]  01/01/2018 11:26 AM  This document serves as a record of services personally performed by Sullivan Lone, MD. It was created on his behalf by Baldwin [place], a trained medical scribe. The creation of this record is based on the scribe's personal observations and the provider's statements to them.   .I have reviewed the above documentation for accuracy and completeness, and I agree with the above. Brunetta Genera MD MS

## 2018-01-01 NOTE — Telephone Encounter (Signed)
Spoke with [employer] and pt set up for appt on 01/08/18 at 0700 for PET at [facility]. Called pt to confirm appt and explain that she needs to remain NPO after midnight the night before. Pt verbalized understanding. In-basket sent to [facility] for high priority pre-authorization of scan.

## 2018-01-02 ENCOUNTER — Telehealth: Payer: Self-pay

## 2018-01-02 ENCOUNTER — Ambulatory Visit: Payer: 59 | Admitting: Hematology

## 2018-01-02 LAB — CULTURE, BLOOD (SINGLE)
MICRO NUMBER:: 90362924
MICRO NUMBER:: 90362926
Result:: NO GROWTH
Result:: NO GROWTH
SPECIMEN QUALITY:: ADEQUATE
SPECIMEN QUALITY:: ADEQUATE

## 2018-01-02 LAB — HEPATITIS B SURFACE ANTIGEN: Hepatitis B Surface Ag: NEGATIVE

## 2018-01-02 LAB — C3 AND C4
C3 Complement: 135 mg/dL (ref 82–167)
Complement C4, Body Fluid: <2 mg/dL — ABNORMAL LOW (ref 14–44)

## 2018-01-02 LAB — HEPATITIS C ANTIBODY: HCV Ab: <0.1 s/co ratio (ref 0.0–0.9)

## 2018-01-02 LAB — HAPTOGLOBIN: Haptoglobin: <10 mg/dL — ABNORMAL LOW (ref 34–200)

## 2018-01-02 LAB — HEPATITIS B CORE ANTIBODY, TOTAL: Hep B Core Total Ab: NEGATIVE

## 2018-01-02 NOTE — Telephone Encounter (Signed)
Requested smear for pt on behalf of Dr. Irene Limbo. Vaughan Basta, Gaffer received request.

## 2018-01-02 NOTE — Telephone Encounter (Signed)
Pt called wondering about iron infusion scheduled in [facility] tomorrow. Dr. Irene Limbo would still like for the patient to get iron. Pt prefers to be treated at the cancer center. Appt cancelled at [facility] and scheduled for 3/29 at 1330 at [facility]. Appointments still scheduled with Dr. Lindi Adie cancelled per physician since the pt is now seeing Dr. Irene Limbo. Pt aware of cancellation and appointment change.

## 2018-01-03 ENCOUNTER — Inpatient Hospital Stay: Payer: 59

## 2018-01-03 ENCOUNTER — Encounter (INDEPENDENT_AMBULATORY_CARE_PROVIDER_SITE_OTHER): Payer: Self-pay

## 2018-01-03 ENCOUNTER — Ambulatory Visit: Payer: 59 | Admitting: Hematology and Oncology

## 2018-01-03 ENCOUNTER — Telehealth: Payer: Self-pay | Admitting: Hematology and Oncology

## 2018-01-03 ENCOUNTER — Encounter (HOSPITAL_COMMUNITY): Payer: 59

## 2018-01-03 VITALS — BP 112/55 | HR 110 | Temp 99.9°F | Resp 17

## 2018-01-03 DIAGNOSIS — D61818 Other pancytopenia: Secondary | ICD-10-CM | POA: Diagnosis not present

## 2018-01-03 DIAGNOSIS — D509 Iron deficiency anemia, unspecified: Secondary | ICD-10-CM | POA: Diagnosis not present

## 2018-01-03 DIAGNOSIS — R509 Fever, unspecified: Secondary | ICD-10-CM | POA: Diagnosis not present

## 2018-01-03 DIAGNOSIS — D709 Neutropenia, unspecified: Secondary | ICD-10-CM | POA: Diagnosis not present

## 2018-01-03 DIAGNOSIS — R161 Splenomegaly, not elsewhere classified: Secondary | ICD-10-CM | POA: Diagnosis not present

## 2018-01-03 DIAGNOSIS — D696 Thrombocytopenia, unspecified: Secondary | ICD-10-CM | POA: Diagnosis not present

## 2018-01-03 LAB — MULTIPLE MYELOMA PANEL, SERUM
Albumin SerPl Elph-Mcnc: 3.7 g/dL (ref 2.9–4.4)
Albumin/Glob SerPl: 1.7 (ref 0.7–1.7)
Alpha 1: 0.4 g/dL (ref 0.0–0.4)
Alpha2 Glob SerPl Elph-Mcnc: 0.6 g/dL (ref 0.4–1.0)
B-Globulin SerPl Elph-Mcnc: 0.9 g/dL (ref 0.7–1.3)
Gamma Glob SerPl Elph-Mcnc: 0.4 g/dL (ref 0.4–1.8)
Globulin, Total: 2.3 g/dL (ref 2.2–3.9)
IgA: 69 mg/dL — ABNORMAL LOW (ref 87–352)
IgG (Immunoglobin G), Serum: 442 mg/dL — ABNORMAL LOW (ref 700–1600)
IgM (Immunoglobulin M), Srm: 20 mg/dL — ABNORMAL LOW (ref 26–217)
Total Protein ELP: 6 g/dL (ref 6.0–8.5)

## 2018-01-03 MED ORDER — SODIUM CHLORIDE 0.9 % IV SOLN
510.0000 mg | Freq: Once | INTRAVENOUS | Status: AC
  Administered 2018-01-03: 510 mg via INTRAVENOUS
  Filled 2018-01-03: qty 17

## 2018-01-03 MED ORDER — SODIUM CHLORIDE 0.9 % IV SOLN
Freq: Once | INTRAVENOUS | Status: AC
  Administered 2018-01-03: 14:00:00 via INTRAVENOUS

## 2018-01-03 NOTE — Telephone Encounter (Signed)
01/03/18 @ 2:00 pm, spoke with patient to inform Disability form has been successfully faxed to [place] at [phone]. Hand delivered forms to patient in infusion.

## 2018-01-03 NOTE — Patient Instructions (Signed)

## 2018-01-05 LAB — HEMOSIDERIN, URINE: Hemosiderin Qual, Ur: NEGATIVE

## 2018-01-08 ENCOUNTER — Ambulatory Visit (HOSPITAL_COMMUNITY)
Admission: RE | Admit: 2018-01-08 | Discharge: 2018-01-08 | Disposition: A | Discharge status: Home / Self Care | Payer: 59 | Source: Ambulatory Visit | Attending: Hematology | Admitting: Hematology

## 2018-01-08 DIAGNOSIS — R740 Nonspecific elevation of levels of transaminase and lactic acid dehydrogenase [LDH]: Secondary | ICD-10-CM | POA: Diagnosis not present

## 2018-01-08 DIAGNOSIS — R161 Splenomegaly, not elsewhere classified: Secondary | ICD-10-CM | POA: Insufficient documentation

## 2018-01-08 DIAGNOSIS — I700 Atherosclerosis of aorta: Secondary | ICD-10-CM | POA: Insufficient documentation

## 2018-01-08 DIAGNOSIS — R7402 Elevation of levels of lactic acid dehydrogenase (LDH): Secondary | ICD-10-CM

## 2018-01-08 DIAGNOSIS — D61818 Other pancytopenia: Secondary | ICD-10-CM | POA: Diagnosis not present

## 2018-01-08 DIAGNOSIS — C8580 Other specified types of non-Hodgkin lymphoma, unspecified site: Secondary | ICD-10-CM | POA: Diagnosis not present

## 2018-01-08 DIAGNOSIS — C884 Extranodal marginal zone B-cell lymphoma of mucosa-associated lymphoid tissue [MALT-lymphoma]: Secondary | ICD-10-CM | POA: Diagnosis not present

## 2018-01-08 LAB — GLUCOSE, CAPILLARY: Glucose-Capillary: 111 mg/dL — ABNORMAL HIGH (ref 65–99)

## 2018-01-08 MED ORDER — FLUDEOXYGLUCOSE F - 18 (FDG) INJECTION
6.4300 | Freq: Once | INTRAVENOUS | Status: AC | PRN
  Administered 2018-01-08: 6.43 via INTRAVENOUS

## 2018-01-09 ENCOUNTER — Encounter (HOSPITAL_COMMUNITY): Payer: Self-pay | Admitting: Hematology and Oncology

## 2018-01-09 ENCOUNTER — Telehealth: Payer: Self-pay | Admitting: Hematology and Oncology

## 2018-01-09 NOTE — Progress Notes (Signed)
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 She is accompanied today by three members of her family. The pt reports that she is doing well overall.   The pt notes that on March 6 she developed a low-grade fever and continues to have a fever. She has kept her fever at [place] with Tylenol every 6 hours. She notes that today she has felt the least feverish since March 6.  She saw her PCP Dr. Hulan Fess who ruled out influenza and UTI. She has also seen rheumatology over the last couple weeks and had a rheumatological workup that did not show overt evidence of a primary autoimmune condition. She was noted to be neg for ANA, RF and CCP Ab. Interesting was noted to have low C4 levels.  She notes that her resting HR has been elevated and describes her heart rate as tachycardic. She had an ECHO on 12/26/17 which showed a normal EF, and was then referred to see infectious disease: CMV and TB Quantiferon were negative.  She notes that the ID team told her there was no clear evidence of an infectious process. TTE no vegetation. BL Bcx neg.  She notes that last week she developed a cough, but believes it to be allergy related. She notes that she has had an enlarged spleen since at least October 2018 revealed by a 07/31/17 US Abdomen. She notes some abdominal discomfort, and has felt that she gets full quickly after eating.  In the last year she intentionally dropped about 20 lbs of weight with change in her diet and avoiding most carbs. She notes that in the last 3 weeks, since she began feeling ill this month, she has  lost about 6-7 lbs.   She also notes that she has been having night sweats that have returned this month after a few months of night sweats last year which abated in August 2018. She notes no other signs of recent infection, and denies any travel outside of the country. She denies any concern of insect bites.   She notes that in [place] she was on iron pills for low Hgb, which increased her Hgb. She stopped taking after her Hgb normalized.  She notes that in March 2018 she had a bad stomach bug and in December 2017 she had influenza.  She notes that she has been under a lot of stress with the deaths of her mother and step-father in a couple weeks of one another.   The pt reports that she has seen rheumatology twice for her hand, knee and hip pain but was confirmed to have osteoarthritis as opposed to rheumatoid arthritis. She notes that the severity of her joint pains mirror the content of her diet. She notes that her joint have never been red or swollen.   She notes that she will be having an iron infusion on 01/03/18.  Of note prior to the patient's visit today, pt has had BM Bx completed on 12/27/17 with results revealing HYPERCELLULAR BONE MARROW FOR AGE WITH NUMEROUS ATYPICAL LYMPHOID AGGREGATES. - TRILINEAGE HEMATOPOIESIS. On 12/27/17 the patient's flow cytometry revealed A MINOR MONOCLONAL  B-CELL POPULATION IDENTIFIED.  Flow cytometric analysis shows a minor population of monoclonal B cells (17% of all lymphocytes) expressing pan B-cell antigens including CD20 with lack of CD5, CD10, CD25 or CD103 expression. Immunohistochemical stains were also performed and show that the lymphoid aggregates show a mixture of T and B cells with slight predominance of T cells. No significant cyclin D1, CD34, or TdT positivity is identified. The overall findings are limited but slightly favor involvement by a B-cell lymphoproliferative process. Consideration was given to marginal zone lymphoma and  splenic lymphoma especially in the presence of splenomegaly. Nonetheless, additional material (ie lymph node or tissue biopsy) is strongly recommended for further evaluation.  I discussed the BM Bx results with Dr Gari Crown -- overall BM Bx + clinical picture certainly concerning for SMZL with possible coombs neg AIHA and possible autoimmune phenomenon related to [place]?Marland Kitchen  Most recent lab results (12/30/17) of CBC  is as follows: all values are WNL except for WBC at 3.5k, RBC at 3.18, Hgb at 7.6, HCT at 24.4, MCV at 76.7, MCH at 23.9, MCHC at 31.1, RDW at 17.0. LDH 12/30/17 was at 616. Haptoglobin<10 -- suggestive of hemolysis. C-reactive protein 09/10/17 was elevated at 20.5.   On review of systems, pt reports low-grade fever, night sweats, some fatigue, tachycardia, occasional lightheadedness, and denies dizziness, weakness, noticing any enlarged lymph nodes, changes in bowel habits, abdominal pains, abnormal vaginal discharge, and any other symptoms.   On PMHx the pt reports high blood pressure and degenerative arthritis. On Family Hx the pt reports her father had rheumatoid arthritis, her mother had kidney disease.  Interval History:  Emily Foley returns today regarding her newly diagnosed Splenic Marginal Zone Lymphoma. The patient's last visit with [place] was on 01/01/18. She is accompanied today by three members of her family. The pt reports that she is doing a little better overall.   The pt reports that her fever has been resolved for 7 days now. She also notes that her intermittent night sweats continue.  She denies any joint pains at this time and has not needed to take Tylenol for 2 weeks now.  She notes that she has chronic H. Pylori which was discovered via an endoscopy - not treated yet.   Of note since the patient's last visit, pt has had a PET completed on 01/08/18 with results revealing 1. Splenomegaly with mild splenic hypermetabolism, consistent with the clinical history. 2. No  evidence of extrasplenic lymphoma. 3.  Aortic Atherosclerosis.  Lab results today (01/10/18) of CBC, CMP, and Reticulocytes is as follows: all values are WNL except for WBC at 2.4k, RBC at 3.13, Hgb at 8.0 (up from 7.1), HCT at 25.9, MCHC at 30.9, RDW at 21.2, Lymphs Ab at 0.6k, Total Protein at 5.8, Retic Ct Pct at 5.5%, Retic Ct Abs at 172.2. LDH 01/10/18 is elevated at 362.   On review of systems, pt reports intermittent night sweats, some coughing, reduced joint pains, and denies fevers, chills, abdominal pains, and any other symptoms.    MEDICAL HISTORY:  Past Medical History:  Diagnosis Date  . Arthritis   . Hypertension     SURGICAL HISTORY: No past surgical history on file.  SOCIAL HISTORY: Social History   Socioeconomic History  . Marital status: Single    Spouse name: Not on file  . Number of children: Not on file  . Years of education: Not on file  . Highest education level: Not on file  Occupational History  . Not  on file  Social Needs  . Financial resource strain: Not on file  . Food insecurity:    Worry: Not on file    Inability: Not on file  . Transportation needs:    Medical: Not on file    Non-medical: Not on file  Tobacco Use  . Smoking status: Never Smoker  . Smokeless tobacco: Never Used  Substance and Sexual Activity  . Alcohol use: Not Currently    Alcohol/week: 0.0 oz  . Drug use: Never  . Sexual activity: Not on file  Lifestyle  . Physical activity:    Days per week: Not on file    Minutes per session: Not on file  . Stress: Not on file  Relationships  . Social connections:    Talks on phone: Not on file    Gets together: Not on file    Attends religious service: Not on file    Active member of club or organization: Not on file    Attends meetings of clubs or organizations: Not on file    Relationship status: Not on file  . Intimate partner violence:    Fear of current or ex partner: Not on file    Emotionally abused: Not on file     Physically abused: Not on file    Forced sexual activity: Not on file  Other Topics Concern  . Not on file  Social History Narrative  . Not on file    FAMILY HISTORY: Family History  Problem Relation Age of Onset  . Diabetes Mother   . Hyperlipidemia Mother   . Hypertension Mother     ALLERGIES:  is allergic to biaxin [clarithromycin].  MEDICATIONS:  Current Outpatient Medications  Medication Sig Dispense Refill  . ferrous sulfate 325 (65 FE) MG EC tablet Take 1 tablet (325 mg total) by mouth daily with breakfast. 30 tablet 3   No current facility-administered medications for this visit.     REVIEW OF SYSTEMS:    10 Point review of Systems was done is negative except as noted above.   PHYSICAL EXAMINATION: ECOG PERFORMANCE STATUS: 1 - Symptomatic but completely ambulatory  . Vitals:   01/10/18 0933  BP: (!) 134/59  Pulse: 95  Resp: 18  Temp: 99 F (37.2 C)  SpO2: 100%   Filed Weights   01/10/18 0933  Weight: 128 lb 12.8 oz (58.4 kg)   .Body mass index is 23.18 kg/m.  GENERAL:alert, in no acute distress and comfortable SKIN: no acute rashes, no significant lesions EYES: conjunctiva are pink and non-injected, sclera anicteric OROPHARYNX: MMM, no exudates, no oropharyngeal erythema or ulceration NECK: supple, no JVD LYMPH:  no palpable lymphadenopathy in the cervical, axillary or inguinal regions LUNGS: clear to auscultation b/l with normal respiratory effort HEART: regular rate & rhythm ABDOMEN:  normoactive bowel sounds , non tender, palpable splenomegaly. Extremity: no pedal edema PSYCH: alert & oriented x 3 with fluent speech NEURO: no focal motor/sensory deficits   LABORATORY DATA:  I have reviewed the data as listed  . CBC Latest Ref Rng & Units 01/10/2018 01/01/2018 12/30/2017  WBC 3.9 - 10.3 K/uL 2.4(L) 2.8(L) 3.5(L)  Hemoglobin 11.6 - 15.9 g/dL - - -  Hematocrit 34.8 - 46.6 % 25.9(L) 23.0(L) 24.4(L)  Platelets 145 - 400 K/uL 173 145 164  HGB  8  ANC 1.6k . CMP Latest Ref Rng & Units 01/10/2018 01/01/2018 12/27/2017  Glucose 70 - 140 mg/dL 110 110 115  BUN 7 - 26 mg/dL 15 20 21  Creatinine 0.60 - 1.10 mg/dL 0.78 0.83 0.87  Sodium 136 - 145 mmol/L 139 137 135(L)  Potassium 3.5 - 5.1 mmol/L 4.8 4.7 4.2  Chloride 98 - 109 mmol/L 108 104 103  CO2 22 - 29 mmol/L 24 24 23   Calcium 8.4 - 10.4 mg/dL 9.0 9.2 8.9  Total Protein 6.4 - 8.3 g/dL 5.8(L) 6.5 6.2(L)  Total Bilirubin 0.2 - 1.2 mg/dL 0.7 1.0 1.0  Alkaline Phos 40 - 150 U/L 67 91 101  AST 5 - 34 U/L 16 21 23   ALT 0 - 55 U/L 11 15 13    Component     Latest Ref Rng & Units 12/27/2017 01/01/2018  Color, Urine     YELLOW  YELLOW  Appearance     CLEAR  CLEAR  Specific Gravity, Urine     1.005 - 1.030  1.020  pH     5.0 - 8.0  5.0  Glucose     NEGATIVE mg/dL  NEGATIVE  Hgb urine dipstick     NEGATIVE  NEGATIVE  Bilirubin Urine     NEGATIVE  NEGATIVE  Ketones, ur     NEGATIVE mg/dL  NEGATIVE  Protein     NEGATIVE mg/dL  NEGATIVE  Nitrite     NEGATIVE  NEGATIVE  Leukocytes, UA     NEGATIVE  TRACE (A)  RBC / HPF     0 - 5 RBC/hpf  0-5  WBC, UA     0 - 5 WBC/hpf  0-5  Bacteria, UA     NONE SEEN  NONE SEEN  Squamous Epithelial / LPF     NONE SEEN  0-5 (A)  Mucus       PRESENT  Hyaline Casts, UA       PRESENT  IgG (Immunoglobin G), Serum     700 - 1,600 mg/dL  442 (L)  IgA     87 - 352 mg/dL  69 (L)  IgM (Immunoglobulin M), Srm     26 - 217 mg/dL  20 (L)  Total Protein ELP     6.0 - 8.5 g/dL  6.0  Albumin SerPl Elph-Mcnc     2.9 - 4.4 g/dL  3.7  Alpha 1     0.0 - 0.4 g/dL  0.4  Alpha2 Glob SerPl Elph-Mcnc     0.4 - 1.0 g/dL  0.6  B-Globulin SerPl Elph-Mcnc     0.7 - 1.3 g/dL  0.9  Gamma Glob SerPl Elph-Mcnc     0.4 - 1.8 g/dL  0.4  M Protein SerPl Elph-Mcnc     Not Observed g/dL  Not Observed  Globulin, Total     2.2 - 3.9 g/dL  2.3  Albumin/Glob SerPl     0.7 - 1.7  1.7  IFE 1       Comment  Please Note (HCV):       Comment  QuantiFERON-TB  Gold Plus     NEGATIVE NEGATIVE   NIL     IU/mL 0.12   Mitogen-NIL     IU/mL 3.06   TB1-NIL     IU/mL <0.00   TB2-NIL     IU/mL <0.00   Iron     41 - 142 ug/dL 28 (L) 39 (L)  TIBC     236 - 444 ug/dL 244 266  Saturation Ratios     21 - 57 % 11 (L) 15 (L)  UIBC     ug/dL 216 227  Retic Ct Pct  0.7 - 2.1 %  4.1 (H)  RBC.     3.70 - 5.45 MIL/uL  2.96 (L)  Retic Count, Absolute     33.7 - 90.7 K/uL  121.4 (H)  C3 Complement     82 - 167 mg/dL  135  Complement C4, Body Fluid     14 - 44 mg/dL  <2 (L)  DAT, complement       NEG  DAT, IgG       NEG . . .  CMV IgM     AU/mL <30.00   Ferritin     9 - 269 ng/mL 474 (H)   Hemosiderin, Urine     Negative  Negative  Hep B Core Ab, Tot     Negative  Negative  Hepatitis B Surface Ag     Negative  Negative  HCV Ab     0.0 - 0.9 s/co ratio  <0.1  Haptoglobin     34 - 200 mg/dL  <10 (L)  LDH     125 - 245 U/L  591 (H)  Antibody Screen       NEG . . .     12/27/17 BM Bx:   12/27/17 Flow Cytometry:     RADIOGRAPHIC STUDIES: I have personally reviewed the radiological images as listed and agreed with the findings in the report. Dg Chest 2 View  Result Date: 12/20/2017 CLINICAL DATA:  Cough and fever EXAM: CHEST - 2 VIEW COMPARISON:  None. FINDINGS: The heart size and mediastinal contours are within normal limits. Both lungs are clear. The visualized skeletal structures are unremarkable. IMPRESSION: No active cardiopulmonary disease. Electronically Signed   By: Franchot Gallo M.D.   On: 12/20/2017 16:24   Nm Pet Image Initial (pi) Skull Base To Thigh  Result Date: 01/08/2018 CLINICAL DATA:  Initial treatment strategy for staging of marginal zone splenic lymphoma. EXAM: NUCLEAR MEDICINE PET SKULL BASE TO THIGH TECHNIQUE: 6.4 mCi F-18 FDG was injected intravenously. Full-ring PET imaging was performed from the skull base to thigh after the radiotracer. CT data was obtained and used for attenuation correction and anatomic  localization. Fasting blood glucose: 111 mg/dl COMPARISON:  Abdominopelvic CT 01/03/2017. Chest radiograph 12/20/2017. FINDINGS: Mediastinal blood pool activity: SUV max 1.6 NECK: No areas of abnormal hypermetabolism. Incidental CT findings: No cervical adenopathy. CHEST: No pulmonary parenchymal or thoracic nodal hypermetabolism. Incidental CT findings: No thoracic adenopathy. ABDOMEN/PELVIS: Mild hypermetabolism throughout the enlarged spleen. This measures 15.1 cm in greatest transverse dimension and a S.U.V. max of 3.7 on image 97/4. Compare similar in size on the CT of 01/03/2017. No abdominopelvic nodal hypermetabolism. Incidental CT findings: No abdominopelvic adenopathy. Abdominal aortic atherosclerosis. SKELETON: No abnormal marrow activity. Incidental CT findings: An upper thoracic vertebral hemangioma. IMPRESSION: 1. Splenomegaly with mild splenic hypermetabolism, consistent with the clinical history. 2. No evidence of extrasplenic lymphoma. 3.  Aortic Atherosclerosis (ICD10-I70.0). Electronically Signed   By: Abigail Miyamoto M.D.   On: 01/08/2018 10:33    ASSESSMENT & PLAN:  62 y.o. female with  1. Pancytopenia due to newly diagnosed Splenic marginal zone lymphoma. Patient appears to be have significant constitutional symptoms . No evidence of rheumatological or infectious etiology for her fevers and night sweats.  2. Splenomegaly - likely due to [place]. PET shows some hypersplenism.  3. Anemia - with elevated LDH and low haptoglobin concerning for hemolysis. Coombs neg . Could be IgA driven Coombs neg AIHA. +ve reticulocytosis and Smear with some microspherocytes. No over urine hemosiderinuria. Partly  anemia could also be from hypersplenism.  She is not much symptomatic from her anemia at this time and hgb stable/improved at 8  4. Low C4 ? Immune complex disease/autoimmunity related to lymphoma. Per rheumatology - no evidence of primary rheumatological disorder. No overt evidence of  endocarditis. 5. Fevers/chills/night sweat -- likely constitutional symptoms related to ?lymphoma 6. Thrombocytopenia ?NSAIDS vs lymphoma vs other etiology. PLAN -Discussed pt labwork today; Hgb has increased to 8.0, LDH has decreased to 362 from 591.  -Discussed the findings of the recent 01/08/18 PET which revealed Splenomegaly with mild splenic hypermetabolism, consistent with the clinical history. No evidence of extrasplenic lymphoma. -Discussed that the patient's constitutional symptoms, BM bx, blood tests, and PET are all consistent with SMZL.  -We would recommend that the pt begin treatment with Rituxan once a week for 4 weeks, followed by a conversation of maintenance. Hep B and Hep C serologies neg. -Rituxan orders placed and signed.  --Recommenedd B complex and Folic Acid supplement to support accelerated hematopoiesis -Pt is okay to continue prn Tylenol as an antipyretic, but should avoid NSAIDs at this time. -transfuse PRBC prn for symptomatic anemia or if hgb<7 --Discussed the process of how we will continue to monitor her post-treatment and answered the family's questions about what kinds of care they can reasonably expect. -Discussed the possible side effects of Rituxan with pt and family- and that we will monitor any possibility of allergic reaction especially during the first cycle. -We will set pt up for chemotherapy education class. -patient and family had extensive questions about diagnosis, treatment and f/u which were answered in details   Rituxan to start in 5 days --weekly x 4 doses Labs with each dose of Rituxan F/u with Dr Lebron Conners in 2 weeks with 2nd dose of Rituxan for toxicity check. F/u Dr Irene Limbo in 4 weeks with 4th dose of Rituxan with labs  All of the  patients questions were answered with apparent satisfaction. The patient knows to call the clinic with any problems, questions or concerns.  . The total time spent in the appointment was 40 minutes and more than 50%  was on counseling and direct patient cares.    Sullivan Lone MD [place] AAHIVMS [facility] Hematology/Oncology Physician [facility]  (Office):       [phone] (Work cell):  [phone] (Fax):           [phone]  01/10/2018 9:39 AM  This document serves as a record of services personally performed by Sullivan Lone, MD. It was created on his behalf by Baldwin [place], a trained medical scribe. The creation of this record is based on the scribe's personal observations and the provider's statements to them.   .I have reviewed the above documentation for accuracy and completeness, and I agree with the above. Brunetta Genera MD MS

## 2018-01-09 NOTE — Telephone Encounter (Signed)
01/09/18 @ 10:35 am, spoke with patient and informed FMLA forms have been successfully faxed to [employer] at [phone]. Patient will pick up copy from front desk at her appointment tomorrow.

## 2018-01-10 ENCOUNTER — Telehealth: Payer: Self-pay

## 2018-01-10 ENCOUNTER — Encounter: Payer: Self-pay | Admitting: Hematology

## 2018-01-10 ENCOUNTER — Inpatient Hospital Stay: Payer: 59 | Attending: Hematology and Oncology

## 2018-01-10 ENCOUNTER — Inpatient Hospital Stay (HOSPITAL_BASED_OUTPATIENT_CLINIC_OR_DEPARTMENT_OTHER): Payer: 59 | Admitting: Hematology

## 2018-01-10 ENCOUNTER — Telehealth: Payer: Self-pay | Admitting: Hematology

## 2018-01-10 VITALS — BP 134/59 | HR 95 | Temp 99.0°F | Resp 18 | Ht 62.5 in | Wt 128.8 lb

## 2018-01-10 DIAGNOSIS — D696 Thrombocytopenia, unspecified: Secondary | ICD-10-CM | POA: Insufficient documentation

## 2018-01-10 DIAGNOSIS — C884 Extranodal marginal zone B-cell lymphoma of mucosa-associated lymphoid tissue [MALT-lymphoma]: Secondary | ICD-10-CM | POA: Diagnosis not present

## 2018-01-10 DIAGNOSIS — R161 Splenomegaly, not elsewhere classified: Secondary | ICD-10-CM | POA: Diagnosis not present

## 2018-01-10 DIAGNOSIS — Z5112 Encounter for antineoplastic immunotherapy: Secondary | ICD-10-CM | POA: Insufficient documentation

## 2018-01-10 DIAGNOSIS — Z7189 Other specified counseling: Secondary | ICD-10-CM

## 2018-01-10 DIAGNOSIS — D61818 Other pancytopenia: Secondary | ICD-10-CM | POA: Diagnosis not present

## 2018-01-10 DIAGNOSIS — R509 Fever, unspecified: Secondary | ICD-10-CM | POA: Diagnosis not present

## 2018-01-10 DIAGNOSIS — D649 Anemia, unspecified: Secondary | ICD-10-CM | POA: Insufficient documentation

## 2018-01-10 DIAGNOSIS — C8580 Other specified types of non-Hodgkin lymphoma, unspecified site: Secondary | ICD-10-CM

## 2018-01-10 DIAGNOSIS — C8307 Small cell B-cell lymphoma, spleen: Secondary | ICD-10-CM

## 2018-01-10 LAB — CMP (CANCER CENTER ONLY)
ALT: 11 U/L (ref 0–55)
AST: 16 U/L (ref 5–34)
Albumin: 3.5 g/dL (ref 3.5–5.0)
Alkaline Phosphatase: 67 U/L (ref 40–150)
Anion gap: 7 (ref 3–11)
BUN: 15 mg/dL (ref 7–26)
CO2: 24 mmol/L (ref 22–29)
Calcium: 9 mg/dL (ref 8.4–10.4)
Chloride: 108 mmol/L (ref 98–109)
Creatinine: 0.78 mg/dL (ref 0.60–1.10)
GFR, Est AFR Am: >60 mL/min (ref >60)
GFR, Estimated: >60 mL/min (ref >60)
Glucose, Bld: 110 mg/dL (ref 70–140)
Potassium: 4.8 mmol/L (ref 3.5–5.1)
Sodium: 139 mmol/L (ref 136–145)
Total Bilirubin: 0.7 mg/dL (ref 0.2–1.2)
Total Protein: 5.8 g/dL — ABNORMAL LOW (ref 6.4–8.3)

## 2018-01-10 LAB — CBC WITH DIFFERENTIAL (CANCER CENTER ONLY)
Basophils Absolute: 0 10*3/uL (ref 0.0–0.1)
Basophils Relative: 0 %
Eosinophils Absolute: 0 10*3/uL (ref 0.0–0.5)
Eosinophils Relative: 2 %
HCT: 25.9 % — ABNORMAL LOW (ref 34.8–46.6)
Hemoglobin: 8 g/dL — ABNORMAL LOW (ref 11.6–15.9)
Lymphocytes Relative: 25 %
Lymphs Abs: 0.6 10*3/uL — ABNORMAL LOW (ref 0.9–3.3)
MCH: 25.6 pg (ref 25.1–34.0)
MCHC: 30.9 g/dL — ABNORMAL LOW (ref 31.5–36.0)
MCV: 82.7 fL (ref 79.5–101.0)
Monocytes Absolute: 0.2 10*3/uL (ref 0.1–0.9)
Monocytes Relative: 8 %
Neutro Abs: 1.6 10*3/uL (ref 1.5–6.5)
Neutrophils Relative %: 65 %
Platelet Count: 173 10*3/uL (ref 145–400)
RBC: 3.13 MIL/uL — ABNORMAL LOW (ref 3.70–5.45)
RDW: 21.2 % — ABNORMAL HIGH (ref 11.2–14.5)
WBC Count: 2.4 10*3/uL — ABNORMAL LOW (ref 3.9–10.3)

## 2018-01-10 LAB — LACTATE DEHYDROGENASE: LDH: 362 U/L — ABNORMAL HIGH (ref 125–245)

## 2018-01-10 LAB — RETICULOCYTES
RBC.: 3.13 MIL/uL — ABNORMAL LOW (ref 3.70–5.45)
Retic Count, Absolute: 172.2 10*3/uL — ABNORMAL HIGH (ref 33.7–90.7)
Retic Ct Pct: 5.5 % — ABNORMAL HIGH (ref 0.7–2.1)

## 2018-01-10 LAB — CHROMOSOME ANALYSIS, BONE MARROW

## 2018-01-10 NOTE — Telephone Encounter (Signed)
Pt called in requesting okay to take oral valtrex and topical treatment for fever blister. Pt to start rituxan next week and she did not want to "mess anything up." Pt verbalized that she only gets one fever blister at a time and it is in relation to stress or being out in the sun. Dr. Irene Limbo okay for pt to take medication and use topical. Pt verbalized understanding.

## 2018-01-10 NOTE — Telephone Encounter (Signed)
Unable to schedule appts per 4/5 los - due to capped days - logged - will contact patient when appts are scheduled.

## 2018-01-10 NOTE — Telephone Encounter (Signed)
Scheduled appts per 4/5 los - patient is aware and will check mychart.

## 2018-01-13 ENCOUNTER — Ambulatory Visit: Payer: 59 | Admitting: Hematology and Oncology

## 2018-01-13 DIAGNOSIS — C8307 Small cell B-cell lymphoma, spleen: Secondary | ICD-10-CM | POA: Insufficient documentation

## 2018-01-13 DIAGNOSIS — Z7189 Other specified counseling: Secondary | ICD-10-CM | POA: Insufficient documentation

## 2018-01-13 NOTE — Progress Notes (Signed)
START ON PATHWAY REGIMEN - Lymphoma and CLL     Administer weekly:     Rituximab   **Always confirm dose/schedule in your pharmacy ordering system**  Patient Characteristics: Marginal Zone Lymphoma, Systemic, First Line, Symptomatic Disease Type: Marginal Zone Lymphoma Disease Type: Not Applicable Disease Type: Not Applicable Localized or Systemic Disease<= Systemic [place] Stage: IV Line of Therapy: First Line Asymptomatic or Symptomatic<= Symptomatic Intent of Therapy: Non-Curative / Palliative Intent, Discussed with Patient 

## 2018-01-15 ENCOUNTER — Telehealth: Payer: Self-pay

## 2018-01-15 NOTE — Telephone Encounter (Signed)
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 Pt has valtrex 1g bid prn at home. Pt to take until breakout resolved. Pt will get in touch with Dr. Irene Limbo at appt on Friday to confirm to continue or stop medication. Requested dosage recommended by Dr. Irene Limbo for vitamin B12. Recommendation of 1039mcg daily. This is what pt has been taking and will continue.

## 2018-01-17 ENCOUNTER — Inpatient Hospital Stay: Payer: 59

## 2018-01-17 VITALS — BP 140/71 | HR 105 | Temp 99.9°F | Resp 18

## 2018-01-17 DIAGNOSIS — Z5112 Encounter for antineoplastic immunotherapy: Secondary | ICD-10-CM | POA: Diagnosis not present

## 2018-01-17 DIAGNOSIS — D696 Thrombocytopenia, unspecified: Secondary | ICD-10-CM | POA: Diagnosis not present

## 2018-01-17 DIAGNOSIS — R509 Fever, unspecified: Secondary | ICD-10-CM | POA: Diagnosis not present

## 2018-01-17 DIAGNOSIS — Z7189 Other specified counseling: Secondary | ICD-10-CM

## 2018-01-17 DIAGNOSIS — D61818 Other pancytopenia: Secondary | ICD-10-CM | POA: Diagnosis not present

## 2018-01-17 DIAGNOSIS — D509 Iron deficiency anemia, unspecified: Secondary | ICD-10-CM

## 2018-01-17 DIAGNOSIS — C884 Extranodal marginal zone B-cell lymphoma of mucosa-associated lymphoid tissue [MALT-lymphoma]: Secondary | ICD-10-CM | POA: Diagnosis not present

## 2018-01-17 DIAGNOSIS — C8307 Small cell B-cell lymphoma, spleen: Secondary | ICD-10-CM

## 2018-01-17 DIAGNOSIS — C8580 Other specified types of non-Hodgkin lymphoma, unspecified site: Secondary | ICD-10-CM

## 2018-01-17 DIAGNOSIS — D649 Anemia, unspecified: Secondary | ICD-10-CM | POA: Diagnosis not present

## 2018-01-17 DIAGNOSIS — R161 Splenomegaly, not elsewhere classified: Secondary | ICD-10-CM

## 2018-01-17 LAB — CBC WITH DIFFERENTIAL/PLATELET
Basophils Absolute: 0 10*3/uL (ref 0.0–0.1)
Basophils Relative: 1 %
Eosinophils Absolute: 0.1 10*3/uL (ref 0.0–0.5)
Eosinophils Relative: 4 %
HCT: 29.6 % — ABNORMAL LOW (ref 34.8–46.6)
Hemoglobin: 9.3 g/dL — ABNORMAL LOW (ref 11.6–15.9)
Lymphocytes Relative: 26 %
Lymphs Abs: 0.7 10*3/uL — ABNORMAL LOW (ref 0.9–3.3)
MCH: 26.2 pg (ref 25.1–34.0)
MCHC: 31.4 g/dL — ABNORMAL LOW (ref 31.5–36.0)
MCV: 83.4 fL (ref 79.5–101.0)
Monocytes Absolute: 0.2 10*3/uL (ref 0.1–0.9)
Monocytes Relative: 8 %
Neutro Abs: 1.6 10*3/uL (ref 1.5–6.5)
Neutrophils Relative %: 61 %
Platelets: 140 10*3/uL — ABNORMAL LOW (ref 145–400)
RBC: 3.55 MIL/uL — ABNORMAL LOW (ref 3.70–5.45)
RDW: 20.4 % — ABNORMAL HIGH (ref 11.2–14.5)
WBC: 2.5 10*3/uL — ABNORMAL LOW (ref 3.9–10.3)

## 2018-01-17 LAB — CMP (CANCER CENTER ONLY)
ALT: 11 U/L (ref 0–55)
AST: 16 U/L (ref 5–34)
Albumin: 4 g/dL (ref 3.5–5.0)
Alkaline Phosphatase: 76 U/L (ref 40–150)
Anion gap: 9 (ref 3–11)
BUN: 16 mg/dL (ref 7–26)
CO2: 23 mmol/L (ref 22–29)
Calcium: 9.4 mg/dL (ref 8.4–10.4)
Chloride: 109 mmol/L (ref 98–109)
Creatinine: 0.8 mg/dL (ref 0.60–1.10)
GFR, Est AFR Am: >60 mL/min (ref >60)
GFR, Estimated: >60 mL/min (ref >60)
Glucose, Bld: 100 mg/dL (ref 70–140)
Potassium: 4.2 mmol/L (ref 3.5–5.1)
Sodium: 141 mmol/L (ref 136–145)
Total Bilirubin: 0.7 mg/dL (ref 0.2–1.2)
Total Protein: 6.4 g/dL (ref 6.4–8.3)

## 2018-01-17 LAB — IRON AND TIBC
Iron: 51 ug/dL (ref 41–142)
Saturation Ratios: 20 % — ABNORMAL LOW (ref 21–57)
TIBC: 256 ug/dL (ref 236–444)
UIBC: 206 ug/dL

## 2018-01-17 LAB — LACTATE DEHYDROGENASE: LDH: 323 U/L — ABNORMAL HIGH (ref 125–245)

## 2018-01-17 LAB — RETICULOCYTES
RBC.: 3.55 MIL/uL — ABNORMAL LOW (ref 3.70–5.45)
Retic Count, Absolute: 138.5 10*3/uL — ABNORMAL HIGH (ref 33.7–90.7)
Retic Ct Pct: 3.9 % — ABNORMAL HIGH (ref 0.7–2.1)

## 2018-01-17 LAB — FERRITIN: Ferritin: 865 ng/mL — ABNORMAL HIGH (ref 9–269)

## 2018-01-17 LAB — SEDIMENTATION RATE: Sed Rate: 18 mm/hr (ref 0–22)

## 2018-01-17 MED ORDER — FAMOTIDINE 20 MG PO TABS: 20.0000 mg | ORAL_TABLET | Freq: Once | ORAL | Status: DC

## 2018-01-17 MED ORDER — FAMOTIDINE IN NACL 20-0.9 MG/50ML-% IV SOLN
20.0000 mg | Freq: Once | INTRAVENOUS | Status: AC
  Administered 2018-01-17: 20 mg via INTRAVENOUS

## 2018-01-17 MED ORDER — SODIUM CHLORIDE 0.9 % IV SOLN
Freq: Once | INTRAVENOUS | Status: AC
  Administered 2018-01-17: 10:00:00 via INTRAVENOUS

## 2018-01-17 MED ORDER — DIPHENHYDRAMINE HCL 25 MG PO CAPS
ORAL_CAPSULE | ORAL | Status: AC
  Filled 2018-01-17: qty 2

## 2018-01-17 MED ORDER — ACETAMINOPHEN 325 MG PO TABS
ORAL_TABLET | ORAL | Status: AC
  Filled 2018-01-17: qty 2

## 2018-01-17 MED ORDER — FAMOTIDINE IN NACL 20-0.9 MG/50ML-% IV SOLN
INTRAVENOUS | Status: AC
  Filled 2018-01-17: qty 50

## 2018-01-17 MED ORDER — DIPHENHYDRAMINE HCL 25 MG PO CAPS
50.0000 mg | ORAL_CAPSULE | Freq: Once | ORAL | Status: AC
  Administered 2018-01-17: 50 mg via ORAL

## 2018-01-17 MED ORDER — METHYLPREDNISOLONE SODIUM SUCC 125 MG IJ SOLR
INTRAMUSCULAR | Status: AC
  Filled 2018-01-17: qty 2

## 2018-01-17 MED ORDER — METHYLPREDNISOLONE SODIUM SUCC 125 MG IJ SOLR
125.0000 mg | Freq: Every day | INTRAMUSCULAR | Status: DC
  Administered 2018-01-17: 125 mg via INTRAVENOUS

## 2018-01-17 MED ORDER — SODIUM CHLORIDE 0.9 % IV SOLN
375.0000 mg/m2 | Freq: Once | INTRAVENOUS | Status: AC
  Administered 2018-01-17: 600 mg via INTRAVENOUS
  Filled 2018-01-17: qty 10

## 2018-01-17 MED ORDER — ACETAMINOPHEN 325 MG PO TABS
650.0000 mg | ORAL_TABLET | Freq: Once | ORAL | Status: AC
  Administered 2018-01-17: 650 mg via ORAL

## 2018-01-17 NOTE — Patient Instructions (Signed)
[facility] Discharge Instructions for Patients Receiving Chemotherapy  Today you received the following chemotherapy agents Rituxan.  To help prevent nausea and vomiting after your treatment, we encourage you to take your nausea medication as prescribed.   If you develop nausea and vomiting that is not controlled by your nausea medication, call the clinic.   BELOW ARE SYMPTOMS THAT SHOULD BE REPORTED IMMEDIATELY:  *FEVER GREATER THAN 100.5 F  *CHILLS WITH OR WITHOUT FEVER  NAUSEA AND VOMITING THAT IS NOT CONTROLLED WITH YOUR NAUSEA MEDICATION  *UNUSUAL SHORTNESS OF BREATH  *UNUSUAL BRUISING OR BLEEDING  TENDERNESS IN MOUTH AND THROAT WITH OR WITHOUT PRESENCE OF ULCERS  *URINARY PROBLEMS  *BOWEL PROBLEMS  UNUSUAL RASH Items with * indicate a potential emergency and should be followed up as soon as possible.  Feel free to call the clinic should you have any questions or concerns. The clinic phone number is [phone].  Please show the CHEMO ALERT CARD at check-in to the Emergency Department and triage nurse.  Rituximab injection What is this medicine? RITUXIMAB (ri TUX i mab) is a monoclonal antibody. It is used to treat certain types of cancer like non-Hodgkin lymphoma and chronic lymphocytic leukemia. It is also used to treat rheumatoid arthritis, granulomatosis with polyangiitis (or Wegener's granulomatosis), and microscopic polyangiitis. This medicine may be used for other purposes; ask your health care provider or pharmacist if you have questions. COMMON BRAND NAME(S): Rituxan What should I tell my health care provider before I take this medicine? They need to know if you have any of these conditions: -heart disease -infection (especially a virus infection such as hepatitis B, chickenpox, cold sores, or herpes) -immune system problems -irregular heartbeat -kidney disease -lung or breathing disease, like asthma -recently received or scheduled to  receive a vaccine -an unusual or allergic reaction to rituximab, mouse proteins, other medicines, foods, dyes, or preservatives -pregnant or trying to get pregnant -breast-feeding How should I use this medicine? This medicine is for infusion into a vein. It is administered in a hospital or clinic by a specially trained health care professional. A special MedGuide will be given to you by the pharmacist with each prescription and refill. Be sure to read this information carefully each time. Talk to your pediatrician regarding the use of this medicine in children. This medicine is not approved for use in children. Overdosage: If you think you have taken too much of this medicine contact a poison control center or emergency room at once. NOTE: This medicine is only for you. Do not share this medicine with others. What if I miss a dose? It is important not to miss a dose. Call your doctor or health care professional if you are unable to keep an appointment. What may interact with this medicine? -cisplatin -other medicines for arthritis like disease modifying antirheumatic drugs or tumor necrosis factor inhibitors -live virus vaccines This list may not describe all possible interactions. Give your health care provider a list of all the medicines, herbs, non-prescription drugs, or dietary supplements you use. Also tell them if you smoke, drink alcohol, or use illegal drugs. Some items may interact with your medicine. What should I watch for while using this medicine? Your condition will be monitored carefully while you are receiving this medicine. You may need blood work done while you are taking this medicine. This medicine can cause serious allergic reactions. To reduce your risk you may need to take medicine before treatment with this medicine. Take your medicine   as directed. In some patients, this medicine may cause a serious brain infection that may cause death. If you have any problems seeing,  thinking, speaking, walking, or standing, tell your doctor right away. If you cannot reach your doctor, urgently seek other source of medical care. Call your doctor or health care professional for advice if you get a fever, chills or sore throat, or other symptoms of a cold or flu. Do not treat yourself. This drug decreases your body's ability to fight infections. Try to avoid being around people who are sick. Do not become pregnant while taking this medicine or for 12 months after stopping it. Women should inform their doctor if they wish to become pregnant or think they might be pregnant. There is a potential for serious side effects to an unborn child. Talk to your health care professional or pharmacist for more information. What side effects may I notice from receiving this medicine? Side effects that you should report to your doctor or health care professional as soon as possible: -breathing problems -chest pain -dizziness or feeling faint -fast, irregular heartbeat -low blood counts - this medicine may decrease the number of white blood cells, red blood cells and platelets. You may be at increased risk for infections and bleeding. -mouth sores -redness, blistering, peeling or loosening of the skin, including inside the mouth (this can be added for any serious or exfoliative rash that could lead to hospitalization) -signs of infection - fever or chills, cough, sore throat, pain or difficulty passing urine -signs and symptoms of kidney injury like trouble passing urine or change in the amount of urine -signs and symptoms of liver injury like dark yellow or brown urine; general ill feeling or flu-like symptoms; light-colored stools; loss of appetite; nausea; right upper belly pain; unusually weak or tired; yellowing of the eyes or skin -stomach pain -vomiting Side effects that usually do not require medical attention (report to your doctor or health care professional if they continue or are  bothersome): -headache -joint pain -muscle cramps or muscle pain This list may not describe all possible side effects. Call your doctor for medical advice about side effects. You may report side effects to FDA at 1-800-FDA-1088. Where should I keep my medicine? This drug is given in a hospital or clinic and will not be stored at home. NOTE: This sheet is a summary. It may not cover all possible information. If you have questions about this medicine, talk to your doctor, pharmacist, or health care provider.  2018 Elsevier/Gold Standard (2016-05-02 15:28:09)  

## 2018-01-24 ENCOUNTER — Encounter: Payer: Self-pay | Admitting: Hematology and Oncology

## 2018-01-24 ENCOUNTER — Inpatient Hospital Stay: Payer: 59

## 2018-01-24 ENCOUNTER — Telehealth: Payer: Self-pay | Admitting: Hematology and Oncology

## 2018-01-24 ENCOUNTER — Inpatient Hospital Stay (HOSPITAL_BASED_OUTPATIENT_CLINIC_OR_DEPARTMENT_OTHER): Payer: 59 | Admitting: Hematology and Oncology

## 2018-01-24 VITALS — BP 116/56 | HR 90 | Temp 98.7°F | Resp 18

## 2018-01-24 VITALS — BP 139/58 | HR 71 | Temp 98.1°F | Resp 18 | Ht 62.5 in | Wt 127.0 lb

## 2018-01-24 DIAGNOSIS — C8307 Small cell B-cell lymphoma, spleen: Secondary | ICD-10-CM

## 2018-01-24 DIAGNOSIS — C8580 Other specified types of non-Hodgkin lymphoma, unspecified site: Secondary | ICD-10-CM

## 2018-01-24 DIAGNOSIS — D696 Thrombocytopenia, unspecified: Secondary | ICD-10-CM | POA: Diagnosis not present

## 2018-01-24 DIAGNOSIS — Z7189 Other specified counseling: Secondary | ICD-10-CM

## 2018-01-24 DIAGNOSIS — R161 Splenomegaly, not elsewhere classified: Secondary | ICD-10-CM | POA: Diagnosis not present

## 2018-01-24 DIAGNOSIS — D61818 Other pancytopenia: Secondary | ICD-10-CM

## 2018-01-24 DIAGNOSIS — Z5112 Encounter for antineoplastic immunotherapy: Secondary | ICD-10-CM

## 2018-01-24 DIAGNOSIS — C884 Extranodal marginal zone B-cell lymphoma of mucosa-associated lymphoid tissue [MALT-lymphoma]: Secondary | ICD-10-CM | POA: Diagnosis not present

## 2018-01-24 DIAGNOSIS — D649 Anemia, unspecified: Secondary | ICD-10-CM | POA: Diagnosis not present

## 2018-01-24 DIAGNOSIS — R509 Fever, unspecified: Secondary | ICD-10-CM | POA: Diagnosis not present

## 2018-01-24 LAB — CMP (CANCER CENTER ONLY)
ALT: 14 U/L (ref 0–55)
AST: 15 U/L (ref 5–34)
Albumin: 4.3 g/dL (ref 3.5–5.0)
Alkaline Phosphatase: 77 U/L (ref 40–150)
Anion gap: 7 (ref 3–11)
BUN: 17 mg/dL (ref 7–26)
CO2: 25 mmol/L (ref 22–29)
Calcium: 9.6 mg/dL (ref 8.4–10.4)
Chloride: 108 mmol/L (ref 98–109)
Creatinine: 0.8 mg/dL (ref 0.60–1.10)
GFR, Est AFR Am: >60 mL/min (ref >60)
GFR, Estimated: >60 mL/min (ref >60)
Glucose, Bld: 97 mg/dL (ref 70–140)
Potassium: 4.2 mmol/L (ref 3.5–5.1)
Sodium: 140 mmol/L (ref 136–145)
Total Bilirubin: 0.9 mg/dL (ref 0.2–1.2)
Total Protein: 6.5 g/dL (ref 6.4–8.3)

## 2018-01-24 LAB — CBC WITH DIFFERENTIAL (CANCER CENTER ONLY)
Basophils Absolute: 0 10*3/uL (ref 0.0–0.1)
Basophils Relative: 1 %
Eosinophils Absolute: 0.2 10*3/uL (ref 0.0–0.5)
Eosinophils Relative: 5 %
HCT: 34.7 % — ABNORMAL LOW (ref 34.8–46.6)
Hemoglobin: 11.3 g/dL — ABNORMAL LOW (ref 11.6–15.9)
Lymphocytes Relative: 15 %
Lymphs Abs: 0.5 10*3/uL — ABNORMAL LOW (ref 0.9–3.3)
MCH: 27.1 pg (ref 25.1–34.0)
MCHC: 32.6 g/dL (ref 31.5–36.0)
MCV: 83.2 fL (ref 79.5–101.0)
Monocytes Absolute: 0.3 10*3/uL (ref 0.1–0.9)
Monocytes Relative: 9 %
Neutro Abs: 2.2 10*3/uL (ref 1.5–6.5)
Neutrophils Relative %: 70 %
Platelet Count: 147 10*3/uL (ref 145–400)
RBC: 4.17 MIL/uL (ref 3.70–5.45)
RDW: 19.3 % — ABNORMAL HIGH (ref 11.2–14.5)
WBC Count: 3.1 10*3/uL — ABNORMAL LOW (ref 3.9–10.3)

## 2018-01-24 LAB — LACTATE DEHYDROGENASE: LDH: 280 U/L — ABNORMAL HIGH (ref 125–245)

## 2018-01-24 LAB — RETICULOCYTES
RBC.: 4.17 MIL/uL (ref 3.70–5.45)
Retic Count, Absolute: 141.8 10*3/uL — ABNORMAL HIGH (ref 33.7–90.7)
Retic Ct Pct: 3.4 % — ABNORMAL HIGH (ref 0.7–2.1)

## 2018-01-24 MED ORDER — FAMOTIDINE 20 MG PO TABS
20.0000 mg | ORAL_TABLET | Freq: Once | ORAL | Status: AC
  Administered 2018-01-24: 20 mg via ORAL

## 2018-01-24 MED ORDER — SODIUM CHLORIDE 0.9 % IV SOLN
375.0000 mg/m2 | Freq: Once | INTRAVENOUS | Status: AC
  Administered 2018-01-24: 600 mg via INTRAVENOUS
  Filled 2018-01-24: qty 50

## 2018-01-24 MED ORDER — ACETAMINOPHEN 325 MG PO TABS
650.0000 mg | ORAL_TABLET | Freq: Once | ORAL | Status: AC
  Administered 2018-01-24: 650 mg via ORAL

## 2018-01-24 MED ORDER — METHYLPREDNISOLONE SODIUM SUCC 125 MG IJ SOLR
125.0000 mg | Freq: Every day | INTRAMUSCULAR | Status: DC
  Administered 2018-01-24: 125 mg via INTRAVENOUS

## 2018-01-24 MED ORDER — DIPHENHYDRAMINE HCL 25 MG PO CAPS
50.0000 mg | ORAL_CAPSULE | Freq: Once | ORAL | Status: AC
  Administered 2018-01-24: 50 mg via ORAL

## 2018-01-24 MED ORDER — DIPHENHYDRAMINE HCL 25 MG PO CAPS
ORAL_CAPSULE | ORAL | Status: AC
  Filled 2018-01-24: qty 2

## 2018-01-24 MED ORDER — METHYLPREDNISOLONE SODIUM SUCC 125 MG IJ SOLR
INTRAMUSCULAR | Status: AC
  Filled 2018-01-24: qty 2

## 2018-01-24 MED ORDER — FAMOTIDINE 20 MG PO TABS
ORAL_TABLET | ORAL | Status: AC
  Filled 2018-01-24: qty 1

## 2018-01-24 MED ORDER — ACETAMINOPHEN 325 MG PO TABS
ORAL_TABLET | ORAL | Status: AC
  Filled 2018-01-24: qty 2

## 2018-01-24 MED ORDER — FAMOTIDINE IN NACL 20-0.9 MG/50ML-% IV SOLN
INTRAVENOUS | Status: AC
  Filled 2018-01-24: qty 50

## 2018-01-24 MED ORDER — SODIUM CHLORIDE 0.9 % IV SOLN
Freq: Once | INTRAVENOUS | Status: AC
  Administered 2018-01-24: 12:00:00 via INTRAVENOUS

## 2018-01-24 NOTE — Patient Instructions (Signed)
[facility] Discharge Instructions for Patients Receiving Chemotherapy  Today you received the following chemotherapy agents Rituxan.  To help prevent nausea and vomiting after your treatment, we encourage you to take your nausea medication as prescribed.   If you develop nausea and vomiting that is not controlled by your nausea medication, call the clinic.   BELOW ARE SYMPTOMS THAT SHOULD BE REPORTED IMMEDIATELY:  *FEVER GREATER THAN 100.5 F  *CHILLS WITH OR WITHOUT FEVER  NAUSEA AND VOMITING THAT IS NOT CONTROLLED WITH YOUR NAUSEA MEDICATION  *UNUSUAL SHORTNESS OF BREATH  *UNUSUAL BRUISING OR BLEEDING  TENDERNESS IN MOUTH AND THROAT WITH OR WITHOUT PRESENCE OF ULCERS  *URINARY PROBLEMS  *BOWEL PROBLEMS  UNUSUAL RASH Items with * indicate a potential emergency and should be followed up as soon as possible.  Feel free to call the clinic should you have any questions or concerns. The clinic phone number is [phone].  Please show the CHEMO ALERT CARD at check-in to the Emergency Department and triage nurse.  Rituximab injection What is this medicine? RITUXIMAB (ri TUX i mab) is a monoclonal antibody. It is used to treat certain types of cancer like non-Hodgkin lymphoma and chronic lymphocytic leukemia. It is also used to treat rheumatoid arthritis, granulomatosis with polyangiitis (or Wegener's granulomatosis), and microscopic polyangiitis. This medicine may be used for other purposes; ask your health care provider or pharmacist if you have questions. COMMON BRAND NAME(S): Rituxan What should I tell my health care provider before I take this medicine? They need to know if you have any of these conditions: -heart disease -infection (especially a virus infection such as hepatitis B, chickenpox, cold sores, or herpes) -immune system problems -irregular heartbeat -kidney disease -lung or breathing disease, like asthma -recently received or scheduled to  receive a vaccine -an unusual or allergic reaction to rituximab, mouse proteins, other medicines, foods, dyes, or preservatives -pregnant or trying to get pregnant -breast-feeding How should I use this medicine? This medicine is for infusion into a vein. It is administered in a hospital or clinic by a specially trained health care professional. A special MedGuide will be given to you by the pharmacist with each prescription and refill. Be sure to read this information carefully each time. Talk to your pediatrician regarding the use of this medicine in children. This medicine is not approved for use in children. Overdosage: If you think you have taken too much of this medicine contact a poison control center or emergency room at once. NOTE: This medicine is only for you. Do not share this medicine with others. What if I miss a dose? It is important not to miss a dose. Call your doctor or health care professional if you are unable to keep an appointment. What may interact with this medicine? -cisplatin -other medicines for arthritis like disease modifying antirheumatic drugs or tumor necrosis factor inhibitors -live virus vaccines This list may not describe all possible interactions. Give your health care provider a list of all the medicines, herbs, non-prescription drugs, or dietary supplements you use. Also tell them if you smoke, drink alcohol, or use illegal drugs. Some items may interact with your medicine. What should I watch for while using this medicine? Your condition will be monitored carefully while you are receiving this medicine. You may need blood work done while you are taking this medicine. This medicine can cause serious allergic reactions. To reduce your risk you may need to take medicine before treatment with this medicine. Take your medicine   as directed. In some patients, this medicine may cause a serious brain infection that may cause death. If you have any problems seeing,  thinking, speaking, walking, or standing, tell your doctor right away. If you cannot reach your doctor, urgently seek other source of medical care. Call your doctor or health care professional for advice if you get a fever, chills or sore throat, or other symptoms of a cold or flu. Do not treat yourself. This drug decreases your body's ability to fight infections. Try to avoid being around people who are sick. Do not become pregnant while taking this medicine or for 12 months after stopping it. Women should inform their doctor if they wish to become pregnant or think they might be pregnant. There is a potential for serious side effects to an unborn child. Talk to your health care professional or pharmacist for more information. What side effects may I notice from receiving this medicine? Side effects that you should report to your doctor or health care professional as soon as possible: -breathing problems -chest pain -dizziness or feeling faint -fast, irregular heartbeat -low blood counts - this medicine may decrease the number of white blood cells, red blood cells and platelets. You may be at increased risk for infections and bleeding. -mouth sores -redness, blistering, peeling or loosening of the skin, including inside the mouth (this can be added for any serious or exfoliative rash that could lead to hospitalization) -signs of infection - fever or chills, cough, sore throat, pain or difficulty passing urine -signs and symptoms of kidney injury like trouble passing urine or change in the amount of urine -signs and symptoms of liver injury like dark yellow or brown urine; general ill feeling or flu-like symptoms; light-colored stools; loss of appetite; nausea; right upper belly pain; unusually weak or tired; yellowing of the eyes or skin -stomach pain -vomiting Side effects that usually do not require medical attention (report to your doctor or health care professional if they continue or are  bothersome): -headache -joint pain -muscle cramps or muscle pain This list may not describe all possible side effects. Call your doctor for medical advice about side effects. You may report side effects to FDA at 1-800-FDA-1088. Where should I keep my medicine? This drug is given in a hospital or clinic and will not be stored at home. NOTE: This sheet is a summary. It may not cover all possible information. If you have questions about this medicine, talk to your doctor, pharmacist, or health care provider.  2018 Elsevier/Gold Standard (2016-05-02 15:28:09)  

## 2018-01-24 NOTE — Telephone Encounter (Signed)
appts already scheduled per 4/19 los.

## 2018-01-26 ENCOUNTER — Encounter: Payer: Self-pay | Admitting: Hematology and Oncology

## 2018-01-26 NOTE — Assessment & Plan Note (Signed)
62 y.o. female with new diagnosis of splenic marginal zone lymphoma, currently undergoing immunotherapy with rituximab which she is tolerating without complications so far.  Clinical evaluation lab workup permissive to proceed with the remainder of the therapy.  Plan:  -Continue systemic immunotherapy as previously scheduled. -Resume Valtrex due to recurrence of aphthous ulcers -Return to clinic in 2 weeks labs, clinic visit, rituximab.with Dr Irene Limbo:

## 2018-01-26 NOTE — Progress Notes (Signed)
[facility] Follow-up Visit:  Assessment: Splenic marginal zone b-cell lymphoma ([place]) 62 y.o. female with new diagnosis of splenic marginal zone lymphoma, currently undergoing immunotherapy with rituximab which she is tolerating without complications so far.  Clinical evaluation lab workup permissive to proceed with the remainder of the therapy.  Plan:  -Continue systemic immunotherapy as previously scheduled. -Resume Valtrex due to recurrence of aphthous ulcers -Return to clinic in 2 weeks labs, clinic visit, rituximab.with Dr Irene Limbo:    Voice recognition software was used and creation of this note. Despite my best effort at editing the text, some misspelling/errors may have occurred.  Orders Placed This Encounter  Procedures  . CBC with Differential (Cancer Center Only)    Standing Status:   Future    Standing Expiration Date:   01/25/2019  . CMP ([place] only)    Standing Status:   Future    Standing Expiration Date:   01/25/2019  . Magnesium    Standing Status:   Future    Standing Expiration Date:   01/24/2019  . Phosphorus    Standing Status:   Future    Standing Expiration Date:   01/24/2019  . Uric acid    Standing Status:   Future    Standing Expiration Date:   01/24/2019  . Lactate dehydrogenase (LDH)    Standing Status:   Future    Standing Expiration Date:   01/24/2019    Cancer Staging No matching staging information was found for the patient.  All questions were answered. . The patient knows to call the clinic with any problems, questions or concerns.  This note was electronically signed.    History of Presenting Illness Emily Foley 62 y.o. presenting to the [facility] for toxicity monitoring while receiving rituximab therapy for diagnosis of splenic marginal zone lymphoma under guidance of Dr Irene Limbo.  Patient received initial infusion of rituximab without difficulties.  Denies any new symptoms since last visit to the  clinic.  Oncological/hematological History:   Splenic marginal zone b-cell lymphoma ([place])   01/13/2018 Initial Diagnosis    Splenic marginal zone b-cell lymphoma ([place])      01/16/2018 -  Chemotherapy    The patient had riTUXimab (RITUXAN) 600 mg in sodium chloride 0.9 % 250 mL (1.9355 mg/mL) infusion, 375 mg/m2 = 600 mg, Intravenous,  Once, 1 of 1 cycle Administration: 600 mg (01/17/2018) riTUXimab (RITUXAN) 600 mg in sodium chloride 0.9 % 190 mL infusion, 375 mg/m2 = 600 mg, Intravenous,  Once, 1 of 3 cycles Administration: 600 mg (01/24/2018)  for chemotherapy treatment.        Medical History: Past Medical History:  Diagnosis Date  . Arthritis   . Hypertension     Surgical History: No past surgical history on file.  Family History: Family History  Problem Relation Age of Onset  . Diabetes Mother   . Hyperlipidemia Mother   . Hypertension Mother     Social History: Social History   Socioeconomic History  . Marital status: Single    Spouse name: Not on file  . Number of children: Not on file  . Years of education: Not on file  . Highest education level: Not on file  Occupational History  . Not on file  Social Needs  . Financial resource strain: Not on file  . Food insecurity:    Worry: Not on file    Inability: Not on file  . Transportation needs:    Medical: Not on file  Non-medical: Not on file  Tobacco Use  . Smoking status: Never Smoker  . Smokeless tobacco: Never Used  Substance and Sexual Activity  . Alcohol use: Not Currently    Alcohol/week: 0.0 oz  . Drug use: Never  . Sexual activity: Not on file  Lifestyle  . Physical activity:    Days per week: Not on file    Minutes per session: Not on file  . Stress: Not on file  Relationships  . Social connections:    Talks on phone: Not on file    Gets together: Not on file    Attends religious service: Not on file    Active member of club or organization: Not on file    Attends meetings of  clubs or organizations: Not on file    Relationship status: Not on file  . Intimate partner violence:    Fear of current or ex partner: Not on file    Emotionally abused: Not on file    Physically abused: Not on file    Forced sexual activity: Not on file  Other Topics Concern  . Not on file  Social History Narrative  . Not on file    Allergies: Allergies  Allergen Reactions  . Biaxin [Clarithromycin] Diarrhea    Medications:  Current Outpatient Medications  Medication Sig Dispense Refill  . folic acid (FOLVITE) 277 MCG tablet Take 400 mcg by mouth daily.    . vitamin B-12 (CYANOCOBALAMIN) 1000 MCG tablet Take 1,000 mcg by mouth daily.     No current facility-administered medications for this visit.     Review of Systems: Review of Systems  All other systems reviewed and are negative.    PHYSICAL EXAMINATION Blood pressure (!) 139/58, pulse 71, temperature 98.1 F (36.7 C), temperature source Oral, resp. rate 18, height 5' 2.5" (1.588 m), weight 127 lb (57.6 kg), SpO2 99 %.  ECOG PERFORMANCE STATUS: 1 - Symptomatic but completely ambulatory  Physical Exam  Constitutional: She is oriented to person, place, and time. She appears well-developed and well-nourished. No distress.  HENT:  Head: Normocephalic and atraumatic.  Mouth/Throat: Oropharynx is clear and moist. No oropharyngeal exudate.  Eyes: Pupils are equal, round, and reactive to light. Conjunctivae and EOM are normal. No scleral icterus.  Neck: No thyromegaly present.  Cardiovascular: Normal rate, regular rhythm and normal heart sounds. Exam reveals no friction rub.  No murmur heard. Pulmonary/Chest: Effort normal and breath sounds normal. No stridor. No respiratory distress. She has no wheezes. She has no rales.  Abdominal: Soft. Bowel sounds are normal. She exhibits no distension and no mass. There is no tenderness. There is no guarding.  Musculoskeletal: She exhibits no edema.  Lymphadenopathy:    She has  no cervical adenopathy.  Neurological: She is alert and oriented to person, place, and time. She displays normal reflexes. No cranial nerve deficit or sensory deficit.  Skin: Skin is warm and dry. No rash noted. She is not diaphoretic. No erythema. No pallor.     LABORATORY DATA: I have personally reviewed the data as listed: Appointment on 01/24/2018  Component Date Value Ref Range Status  . LDH 01/24/2018 280* 125 - 245 U/L Final   Performed at [facility], [place] [address], [place], [place] [address]  . Sodium 01/24/2018 140  136 - 145 mmol/L Final  . Potassium 01/24/2018 4.2  3.5 - 5.1 mmol/L Final  . Chloride 01/24/2018 108  98 - 109 mmol/L Final  . CO2 01/24/2018 25  22 - 29 mmol/L Final  . Glucose, Bld 01/24/2018 97  70 - 140 mg/dL Final  . BUN 01/24/2018 17  7 - 26 mg/dL Final  . Creatinine 01/24/2018 0.80  0.60 - 1.10 mg/dL Final  . Calcium 01/24/2018 9.6  8.4 - 10.4 mg/dL Final  . Total Protein 01/24/2018 6.5  6.4 - 8.3 g/dL Final  . Albumin 01/24/2018 4.3  3.5 - 5.0 g/dL Final  . AST 01/24/2018 15  5 - 34 U/L Final  . ALT 01/24/2018 14  0 - 55 U/L Final  . Alkaline Phosphatase 01/24/2018 77  40 - 150 U/L Final  . Total Bilirubin 01/24/2018 0.9  0.2 - 1.2 mg/dL Final  . GFR, Est Non Af Am 01/24/2018 >60  >60 mL/min Final  . GFR, Est AFR Am 01/24/2018 >60  >60 mL/min Final   Comment: (NOTE) The eGFR has been calculated using the CKD EPI equation. This calculation has not been validated in all clinical situations. eGFR's persistently <60 mL/min signify possible Chronic Kidney Disease.   Georgiann Hahn gap 01/24/2018 7  3 - 11 Final   Performed at [facility], [place] [address], [place], [place] [address]  . WBC Count 01/24/2018 3.1* 3.9 - 10.3 K/uL Final  . RBC 01/24/2018 4.17  3.70 - 5.45 MIL/uL Final  . Hemoglobin 01/24/2018 11.3* 11.6 - 15.9 g/dL Final  . HCT 01/24/2018 34.7* 34.8 - 46.6 % Final  . MCV 01/24/2018 83.2  79.5 -  101.0 fL Final  . MCH 01/24/2018 27.1  25.1 - 34.0 pg Final  . MCHC 01/24/2018 32.6  31.5 - 36.0 g/dL Final  . RDW 01/24/2018 19.3* 11.2 - 14.5 % Final  . Platelet Count 01/24/2018 147  145 - 400 K/uL Final  . Neutrophils Relative % 01/24/2018 70  % Final  . Neutro Abs 01/24/2018 2.2  1.5 - 6.5 K/uL Final  . Lymphocytes Relative 01/24/2018 15  % Final  . Lymphs Abs 01/24/2018 0.5* 0.9 - 3.3 K/uL Final  . Monocytes Relative 01/24/2018 9  % Final  . Monocytes Absolute 01/24/2018 0.3  0.1 - 0.9 K/uL Final  . Eosinophils Relative 01/24/2018 5  % Final  . Eosinophils Absolute 01/24/2018 0.2  0.0 - 0.5 K/uL Final  . Basophils Relative 01/24/2018 1  % Final  . Basophils Absolute 01/24/2018 0.0  0.0 - 0.1 K/uL Final   Performed at [facility], [place] [address], [place], [place] [address]  . Retic Ct Pct 01/24/2018 3.4* 0.7 - 2.1 % Final  . RBC. 01/24/2018 4.17  3.70 - 5.45 MIL/uL Final  . Retic Count, Absolute 01/24/2018 141.8* 33.7 - 90.7 K/uL Final   Performed at [facility], [place] [address], [place],  [address]       Ardath Sax, MD

## 2018-01-31 ENCOUNTER — Inpatient Hospital Stay: Payer: 59

## 2018-01-31 ENCOUNTER — Other Ambulatory Visit: Payer: Self-pay | Admitting: Hematology and Oncology

## 2018-01-31 VITALS — BP 113/66 | HR 82 | Temp 98.4°F | Resp 18

## 2018-01-31 DIAGNOSIS — R161 Splenomegaly, not elsewhere classified: Secondary | ICD-10-CM | POA: Diagnosis not present

## 2018-01-31 DIAGNOSIS — D696 Thrombocytopenia, unspecified: Secondary | ICD-10-CM | POA: Diagnosis not present

## 2018-01-31 DIAGNOSIS — C8307 Small cell B-cell lymphoma, spleen: Secondary | ICD-10-CM

## 2018-01-31 DIAGNOSIS — R509 Fever, unspecified: Secondary | ICD-10-CM | POA: Diagnosis not present

## 2018-01-31 DIAGNOSIS — D61818 Other pancytopenia: Secondary | ICD-10-CM | POA: Diagnosis not present

## 2018-01-31 DIAGNOSIS — C884 Extranodal marginal zone B-cell lymphoma of mucosa-associated lymphoid tissue [MALT-lymphoma]: Secondary | ICD-10-CM | POA: Diagnosis not present

## 2018-01-31 DIAGNOSIS — Z7189 Other specified counseling: Secondary | ICD-10-CM

## 2018-01-31 DIAGNOSIS — Z5112 Encounter for antineoplastic immunotherapy: Secondary | ICD-10-CM | POA: Diagnosis not present

## 2018-01-31 DIAGNOSIS — C8580 Other specified types of non-Hodgkin lymphoma, unspecified site: Secondary | ICD-10-CM

## 2018-01-31 DIAGNOSIS — D649 Anemia, unspecified: Secondary | ICD-10-CM | POA: Diagnosis not present

## 2018-01-31 LAB — RETICULOCYTES
RBC.: 4.33 MIL/uL (ref 3.70–5.45)
Retic Count, Absolute: 121.2 10*3/uL — ABNORMAL HIGH (ref 33.7–90.7)
Retic Ct Pct: 2.8 % — ABNORMAL HIGH (ref 0.7–2.1)

## 2018-01-31 LAB — CMP (CANCER CENTER ONLY)
ALT: 12 U/L (ref 0–55)
AST: 15 U/L (ref 5–34)
Albumin: 4.3 g/dL (ref 3.5–5.0)
Alkaline Phosphatase: 69 U/L (ref 40–150)
Anion gap: 9 (ref 3–11)
BUN: 22 mg/dL (ref 7–26)
CO2: 23 mmol/L (ref 22–29)
Calcium: 9.6 mg/dL (ref 8.4–10.4)
Chloride: 108 mmol/L (ref 98–109)
Creatinine: 0.78 mg/dL (ref 0.60–1.10)
GFR, Est AFR Am: >60 mL/min (ref >60)
GFR, Estimated: >60 mL/min (ref >60)
Glucose, Bld: 103 mg/dL (ref 70–140)
Potassium: 4.4 mmol/L (ref 3.5–5.1)
Sodium: 140 mmol/L (ref 136–145)
Total Bilirubin: 1.1 mg/dL (ref 0.2–1.2)
Total Protein: 6.3 g/dL — ABNORMAL LOW (ref 6.4–8.3)

## 2018-01-31 LAB — CBC WITH DIFFERENTIAL (CANCER CENTER ONLY)
Basophils Absolute: 0 10*3/uL (ref 0.0–0.1)
Basophils Relative: 1 %
Eosinophils Absolute: 0.2 10*3/uL (ref 0.0–0.5)
Eosinophils Relative: 5 %
HCT: 35.5 % (ref 34.8–46.6)
Hemoglobin: 11.7 g/dL (ref 11.6–15.9)
Lymphocytes Relative: 17 %
Lymphs Abs: 0.7 10*3/uL — ABNORMAL LOW (ref 0.9–3.3)
MCH: 27 pg (ref 25.1–34.0)
MCHC: 33 g/dL (ref 31.5–36.0)
MCV: 82 fL (ref 79.5–101.0)
Monocytes Absolute: 0.4 10*3/uL (ref 0.1–0.9)
Monocytes Relative: 11 %
Neutro Abs: 2.8 10*3/uL (ref 1.5–6.5)
Neutrophils Relative %: 66 %
Platelet Count: 147 10*3/uL (ref 145–400)
RBC: 4.33 MIL/uL (ref 3.70–5.45)
RDW: 18.1 % — ABNORMAL HIGH (ref 11.2–14.5)
WBC Count: 4.1 10*3/uL (ref 3.9–10.3)

## 2018-01-31 LAB — LACTATE DEHYDROGENASE: LDH: 250 U/L — ABNORMAL HIGH (ref 125–245)

## 2018-01-31 MED ORDER — FAMOTIDINE 20 MG PO TABS
20.0000 mg | ORAL_TABLET | Freq: Once | ORAL | Status: AC
  Administered 2018-01-31: 20 mg via ORAL

## 2018-01-31 MED ORDER — ACETAMINOPHEN 325 MG PO TABS
650.0000 mg | ORAL_TABLET | Freq: Once | ORAL | Status: AC
  Administered 2018-01-31: 650 mg via ORAL

## 2018-01-31 MED ORDER — ACETAMINOPHEN 325 MG PO TABS
ORAL_TABLET | ORAL | Status: AC
  Filled 2018-01-31: qty 2

## 2018-01-31 MED ORDER — DIPHENHYDRAMINE HCL 25 MG PO CAPS
ORAL_CAPSULE | ORAL | Status: AC
  Filled 2018-01-31: qty 2

## 2018-01-31 MED ORDER — METHYLPREDNISOLONE SODIUM SUCC 125 MG IJ SOLR
125.0000 mg | Freq: Every day | INTRAMUSCULAR | Status: DC
  Administered 2018-01-31: 125 mg via INTRAVENOUS

## 2018-01-31 MED ORDER — METHYLPREDNISOLONE SODIUM SUCC 125 MG IJ SOLR
INTRAMUSCULAR | Status: AC
  Filled 2018-01-31: qty 2

## 2018-01-31 MED ORDER — SODIUM CHLORIDE 0.9 % IV SOLN
Freq: Once | INTRAVENOUS | Status: AC
  Administered 2018-01-31: 11:00:00 via INTRAVENOUS

## 2018-01-31 MED ORDER — SODIUM CHLORIDE 0.9 % IV SOLN
375.0000 mg/m2 | Freq: Once | INTRAVENOUS | Status: AC
  Administered 2018-01-31: 600 mg via INTRAVENOUS
  Filled 2018-01-31: qty 50

## 2018-01-31 MED ORDER — DIPHENHYDRAMINE HCL 25 MG PO CAPS
50.0000 mg | ORAL_CAPSULE | Freq: Once | ORAL | Status: AC
  Administered 2018-01-31: 50 mg via ORAL

## 2018-01-31 MED ORDER — FAMOTIDINE 20 MG PO TABS
ORAL_TABLET | ORAL | Status: AC
  Filled 2018-01-31: qty 1

## 2018-01-31 NOTE — Patient Instructions (Signed)
[facility] Discharge Instructions for Patients Receiving Chemotherapy  Today you received the following chemotherapy agents:  Rituxan.  To help prevent nausea and vomiting after your treatment, we encourage you to take your nausea medication as directed.   If you develop nausea and vomiting that is not controlled by your nausea medication, call the clinic.   BELOW ARE SYMPTOMS THAT SHOULD BE REPORTED IMMEDIATELY:  *FEVER GREATER THAN 100.5 F  *CHILLS WITH OR WITHOUT FEVER  NAUSEA AND VOMITING THAT IS NOT CONTROLLED WITH YOUR NAUSEA MEDICATION  *UNUSUAL SHORTNESS OF BREATH  *UNUSUAL BRUISING OR BLEEDING  TENDERNESS IN MOUTH AND THROAT WITH OR WITHOUT PRESENCE OF ULCERS  *URINARY PROBLEMS  *BOWEL PROBLEMS  UNUSUAL RASH Items with * indicate a potential emergency and should be followed up as soon as possible.  Feel free to call the clinic should you have any questions or concerns. The clinic phone number is [phone].  Please show the CHEMO ALERT CARD at check-in to the Emergency Department and triage nurse.   

## 2018-02-04 ENCOUNTER — Other Ambulatory Visit: Payer: Self-pay | Admitting: Hematology

## 2018-02-06 NOTE — Progress Notes (Signed)
HEMATOLOGY/ONCOLOGY CLINIC NOTE  Date of Service: 02/07/18  Patient Care Team: Hulan Fess, MD as PCP - General (Family Medicine)  CHIEF COMPLAINTS/PURPOSE OF CONSULTATION:  F/u for mx of SMZL  HISTORY OF PRESENTING ILLNESS:   Emily Foley is a wonderful 62 y.o. 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 She is accompanied today by three members of her family. The pt reports that she is doing well overall.   The pt notes that on March 6 she developed a low-grade fever and continues to have a fever. She has kept her fever at [place] with Tylenol every 6 hours. She notes that today she has felt the least feverish since March 6.  She saw her PCP Dr. Hulan Fess who ruled out influenza and UTI. She has also seen rheumatology over the last couple weeks and had a rheumatological workup that did not show overt evidence of a primary autoimmune condition. She was noted to be neg for ANA, RF and CCP Ab. Interesting was noted to have low C4 levels.  She notes that her resting HR has been elevated and describes her heart rate as tachycardic. She had an ECHO on 12/26/17 which showed a normal EF, and was then referred to see infectious disease: CMV and TB Quantiferon were negative.  She notes that the ID team told her there was no clear evidence of an infectious process. TTE no vegetation. BL Bcx neg.  She notes that last week she developed a cough, but believes it to be allergy related. She notes that she has had an enlarged spleen since at least October 2018 revealed by a 07/31/17 US Abdomen. She notes some abdominal discomfort, and has felt that she gets full quickly after eating.  In the last year she intentionally dropped about 20 lbs of weight with change in her diet and avoiding most carbs. She notes that in the last 3 weeks, since she began feeling ill this month, she has lost about 6-7 lbs.    She also notes that she has been having night sweats that have returned this month after a few months of night sweats last year which abated in August 2018. She notes no other signs of recent infection, and denies any travel outside of the country. She denies any concern of insect bites.   She notes that in [place] she was on iron pills for low Hgb, which increased her Hgb. She stopped taking after her Hgb normalized.  She notes that in March 2018 she had a bad stomach bug and in December 2017 she had influenza.  She notes that she has been under a lot of stress with the deaths of her mother and step-father in a couple weeks of one another.   The pt reports that she has seen rheumatology twice for her hand, knee and hip pain but was confirmed to have osteoarthritis as opposed to rheumatoid arthritis. She notes that the severity of her joint pains mirror the content of her diet. She notes that her joint have never been red or swollen.   She notes that she will be having an iron infusion on 01/03/18.  Of note prior to the patient's visit today, pt has had BM Bx completed on 12/27/17 with results revealing HYPERCELLULAR BONE MARROW FOR AGE WITH NUMEROUS ATYPICAL LYMPHOID AGGREGATES. - TRILINEAGE HEMATOPOIESIS. On 12/27/17 the patient's flow cytometry revealed A MINOR MONOCLONAL B-CELL POPULATION  IDENTIFIED.  Flow cytometric analysis shows a minor population of monoclonal B cells (17% of all lymphocytes) expressing pan B-cell antigens including CD20 with lack of CD5, CD10, CD25 or CD103 expression. Immunohistochemical stains were also performed and show that the lymphoid aggregates show a mixture of T and B cells with slight predominance of T cells. No significant cyclin D1, CD34, or TdT positivity is identified. The overall findings are limited but slightly favor involvement by a B-cell lymphoproliferative process. Consideration was given to marginal zone lymphoma and splenic lymphoma  especially in the presence of splenomegaly. Nonetheless, additional material (ie lymph node or tissue biopsy) is strongly recommended for further evaluation.  I discussed the BM Bx results with Dr Gari Crown -- overall BM Bx + clinical picture certainly concerning for SMZL with possible coombs neg AIHA and possible autoimmune phenomenon related to [name] Kitchen  Most recent lab results (12/30/17) of CBC  is as follows: all values are WNL except for WBC at 3.5k, RBC at 3.18, Hgb at 7.6, HCT at 24.4, MCV at 76.7, MCH at 23.9, MCHC at 31.1, RDW at 17.0. LDH 12/30/17 was at 616. Haptoglobin<10 -- suggestive of hemolysis. C-reactive protein 09/10/17 was elevated at 20.5.   On review of systems, pt reports low-grade fever, night sweats, some fatigue, tachycardia, occasional lightheadedness, and denies dizziness, weakness, noticing any enlarged lymph nodes, changes in bowel habits, abdominal pains, abnormal vaginal discharge, and any other symptoms.   On PMHx the pt reports high blood pressure and degenerative arthritis. On Family Hx the pt reports her father had rheumatoid arthritis, her mother had kidney disease.  Interval History:  Emily Foley returns today regarding her recently diagnosed Splenic Marginal Zone Lymphoma. The patient's last visit with [place] was on 01/10/18. She is accompanied today by two family members. The pt reports that she is doing well overall.   The pt reports that she has had a little more fatigue and back pain in the last two weeks, she denies these being concerning to her. She also notes that she has been more active recently with yard work and has no concerns. Pt notes that she is eating an anti-inflammatory diet and is making sure that she is eating plenty. She notes that she is emotionally doing very well also.  She has had no issues tolerating the Rituxan currently and is here for f/u prior to her 4th dose of Rituxan.  Lab results today (02/07/18) of CBC, CMP is as follows: all values  are WNL except for RDW at 17.6, Lymphs Abs at 0.8k, Chloride at 110, Total Protein at 6.3. Magnesium 02/07/18 is WNL at 2.1. Phosphorous 02/07/18 is elevatd at 5.1. Uric Acid 02/07/18 is WNL at 5.4.  LDH 02/07/18 is . Lab Results  Component Value Date   LDH 249 (H) 02/07/2018    On review of systems, pt reports resolved night sweats, stable knee and hip pain, and denies any sores, nausea, fevers, chills, night sweats, new bone pains, noticing any new lumps or bumps, and any other symptoms.    MEDICAL HISTORY:  Past Medical History:  Diagnosis Date  . Arthritis   . Hypertension     SURGICAL HISTORY: History reviewed. No pertinent surgical history.  SOCIAL HISTORY: Social History   Socioeconomic History  . Marital status: Single    Spouse name: Not on file  . Number of children: Not on file  . Years of education: Not on file  . Highest education level: Not on file  Occupational History  .  Not on file  Social Needs  . Financial resource strain: Not on file  . Food insecurity:    Worry: Not on file    Inability: Not on file  . Transportation needs:    Medical: Not on file    Non-medical: Not on file  Tobacco Use  . Smoking status: Never Smoker  . Smokeless tobacco: Never Used  Substance and Sexual Activity  . Alcohol use: Not Currently    Alcohol/week: 0.0 oz  . Drug use: Never  . Sexual activity: Not on file  Lifestyle  . Physical activity:    Days per week: Not on file    Minutes per session: Not on file  . Stress: Not on file  Relationships  . Social connections:    Talks on phone: Not on file    Gets together: Not on file    Attends religious service: Not on file    Active member of club or organization: Not on file    Attends meetings of clubs or organizations: Not on file    Relationship status: Not on file  . Intimate partner violence:    Fear of current or ex partner: Not on file    Emotionally abused: Not on file    Physically abused: Not on file     Forced sexual activity: Not on file  Other Topics Concern  . Not on file  Social History Narrative  . Not on file    FAMILY HISTORY: Family History  Problem Relation Age of Onset  . Diabetes Mother   . Hyperlipidemia Mother   . Hypertension Mother     ALLERGIES:  is allergic to biaxin [clarithromycin].  MEDICATIONS:  Current Outpatient Medications  Medication Sig Dispense Refill  . folic acid (FOLVITE) 485 MCG tablet Take 400 mcg by mouth daily.    . vitamin B-12 (CYANOCOBALAMIN) 1000 MCG tablet Take 1,000 mcg by mouth daily.     No current facility-administered medications for this visit.     REVIEW OF SYSTEMS:    .10 Point review of Systems was done is negative except as noted above.   PHYSICAL EXAMINATION: ECOG PERFORMANCE STATUS: 1 - Symptomatic but completely ambulatory  . Vitals:   02/07/18 1021  BP: 128/68  Pulse: 69  Resp: 18  Temp: 98.6 F (37 C)  SpO2: 100%   Filed Weights   02/07/18 1021  Weight: 125 lb 8 oz (56.9 kg)   .Body mass index is 22.59 kg/m.  Marland Kitchen GENERAL:alert, in no acute distress and comfortable SKIN: no acute rashes, no significant lesions EYES: conjunctiva are pink and non-injected, sclera anicteric OROPHARYNX: MMM, no exudates, no oropharyngeal erythema or ulceration NECK: supple, no JVD LYMPH:  no palpable lymphadenopathy in the cervical, axillary or inguinal regions LUNGS: clear to auscultation b/l with normal respiratory effort HEART: regular rate & rhythm ABDOMEN:  normoactive bowel sounds , non tender, not distended. Extremity: no pedal edema PSYCH: alert & oriented x 3 with fluent speech NEURO: no focal motor/sensory deficits    LABORATORY DATA:  I have reviewed the data as listed  . CBC Latest Ref Rng & Units 02/07/2018 01/31/2018 01/24/2018  WBC 3.9 - 10.3 K/uL 4.1 4.1 3.1(L)  Hemoglobin 11.6 - 15.9 g/dL 12.3 11.7 11.3(L)  Hematocrit 34.8 - 46.6 % 36.8 35.5 34.7(L)  Platelets 145 - 400 K/uL 158 147 147   . CMP  Latest Ref Rng & Units 02/07/2018 01/31/2018 01/24/2018  Glucose 70 - 140 mg/dL 109 103 97  BUN 7 -  26 mg/dL 20 22 17   Creatinine 0.60 - 1.10 mg/dL 0.74 0.78 0.80  Sodium 136 - 145 mmol/L 140 140 140  Potassium 3.5 - 5.1 mmol/L 4.0 4.4 4.2  Chloride 98 - 109 mmol/L 110(H) 108 108  CO2 22 - 29 mmol/L 24 23 25   Calcium 8.4 - 10.4 mg/dL 9.4 9.6 9.6  Total Protein 6.4 - 8.3 g/dL 6.3(L) 6.3(L) 6.5  Total Bilirubin 0.2 - 1.2 mg/dL 1.2 1.1 0.9  Alkaline Phos 40 - 150 U/L 65 69 77  AST 5 - 34 U/L 18 15 15   ALT 0 - 55 U/L 15 12 14     Component     Latest Ref Rng & Units 12/27/2017 01/01/2018  Color, Urine     YELLOW  YELLOW  Appearance     CLEAR  CLEAR  Specific Gravity, Urine     1.005 - 1.030  1.020  pH     5.0 - 8.0  5.0  Glucose     NEGATIVE mg/dL  NEGATIVE  Hgb urine dipstick     NEGATIVE  NEGATIVE  Bilirubin Urine     NEGATIVE  NEGATIVE  Ketones, ur     NEGATIVE mg/dL  NEGATIVE  Protein     NEGATIVE mg/dL  NEGATIVE  Nitrite     NEGATIVE  NEGATIVE  Leukocytes, UA     NEGATIVE  TRACE (A)  RBC / HPF     0 - 5 RBC/hpf  0-5  WBC, UA     0 - 5 WBC/hpf  0-5  Bacteria, UA     NONE SEEN  NONE SEEN  Squamous Epithelial / LPF     NONE SEEN  0-5 (A)  Mucus       PRESENT  Hyaline Casts, UA       PRESENT  IgG (Immunoglobin G), Serum     700 - 1,600 mg/dL  442 (L)  IgA     87 - 352 mg/dL  69 (L)  IgM (Immunoglobulin M), Srm     26 - 217 mg/dL  20 (L)  Total Protein ELP     6.0 - 8.5 g/dL  6.0  Albumin SerPl Elph-Mcnc     2.9 - 4.4 g/dL  3.7  Alpha 1     0.0 - 0.4 g/dL  0.4  Alpha2 Glob SerPl Elph-Mcnc     0.4 - 1.0 g/dL  0.6  B-Globulin SerPl Elph-Mcnc     0.7 - 1.3 g/dL  0.9  Gamma Glob SerPl Elph-Mcnc     0.4 - 1.8 g/dL  0.4  M Protein SerPl Elph-Mcnc     Not Observed g/dL  Not Observed  Globulin, Total     2.2 - 3.9 g/dL  2.3  Albumin/Glob SerPl     0.7 - 1.7  1.7  IFE 1       Comment  Please Note (HCV):       Comment  QuantiFERON-TB Gold Plus     NEGATIVE  NEGATIVE   NIL     IU/mL 0.12   Mitogen-NIL     IU/mL 3.06   TB1-NIL     IU/mL <0.00   TB2-NIL     IU/mL <0.00   Iron     41 - 142 ug/dL 28 (L) 39 (L)  TIBC     236 - 444 ug/dL 244 266  Saturation Ratios     21 - 57 % 11 (L) 15 (L)  UIBC     ug/dL 216 227  Retic Ct Pct     0.7 - 2.1 %  4.1 (H)  RBC.     3.70 - 5.45 MIL/uL  2.96 (L)  Retic Count, Absolute     33.7 - 90.7 K/uL  121.4 (H)  C3 Complement     82 - 167 mg/dL  135  Complement C4, Body Fluid     14 - 44 mg/dL  <2 (L)  DAT, complement       NEG  DAT, IgG       NEG . . .  CMV IgM     AU/mL <30.00   Ferritin     9 - 269 ng/mL 474 (H)   Hemosiderin, Urine     Negative  Negative  Hep B Core Ab, Tot     Negative  Negative  Hepatitis B Surface Ag     Negative  Negative  HCV Ab     0.0 - 0.9 s/co ratio  <0.1  Haptoglobin     34 - 200 mg/dL  <10 (L)  LDH     125 - 245 U/L  591 (H)  Antibody Screen       NEG . . .     12/27/17 BM Bx:   12/27/17 Flow Cytometry:     RADIOGRAPHIC STUDIES: I have personally reviewed the radiological images as listed and agreed with the findings in the report. No results found.  ASSESSMENT & PLAN:  62 y.o. female with  1. Pancytopenia (resolving) due to newly diagnosed Splenic marginal zone lymphoma. Patient appeared to be have significant constitutional symptoms . No evidence of rheumatological or infectious etiology for her fevers and night sweats.  2. Splenomegaly - likely due to [place]. PET shows some hypersplenism.  3. Anemia - with elevated LDH and low haptoglobin concerning for hemolysis. Coombs neg . Could be IgA driven Coombs neg AIHA. +ve reticulocytosis and Smear with some microspherocytes. No over urine hemosiderinuria. Partly anemia could also be from hypersplenism.  She is not much symptomatic from her anemia at this time and hgb stable/improved at 12.3  4. Low C4 ? Immune complex disease/autoimmunity related to lymphoma. Per rheumatology - no evidence of  primary rheumatological disorder. No overt evidence of endocarditis. 5. Fevers/chills/night sweat -- likely constitutional symptoms related to ?lymphoma 6. Thrombocytopenia ?NSAIDS vs lymphoma vs other etiology. PLAN  -Discussed pt labwork today, 02/07/18; WBC have normalized at 4.1k, anemia has resolved with Hgb at 12.3, blood chemistries are stable, Uric Acid is WNL. Phosphorous elevated at 5.1.  -The pt has no prohibitive toxicities from continuing Rituxan at this time.  -Repeat imaging of spleen in 6-8 weeks.  -Recommend continuing B complex and Folic Acid supplement to support accelerated hematopoiesis -Pt is okay to continue prn Tylenol as an antipyretic, but should avoid NSAIDs at this time. -Discussed the process of how we will continue to monitor her post-treatment and answered the family's questions about what kinds of care they can reasonably expect.  RTC with Dr Irene Limbo in 3 weeks with labs   All of the  patients questions were answered with apparent satisfaction. The patient knows to call the clinic with any problems, questions or concerns.  . The total time spent in the appointment was 25 minutes and more than 50% was on counseling and direct patient cares.     Sullivan Lone MD [place] AAHIVMS [facility] Hematology/Oncology Physician [facility]  (Office):       [phone] (Work cell):  [phone] (Fax):  [phone]  02/07/2018 11:08 AM  This document serves as a record of services personally performed by Sullivan Lone, MD. It was created on his behalf by Baldwin Jamaica, a trained medical scribe. The creation of this record is based on the scribe's personal observations and the provider's statements to them.   .I have reviewed the above documentation for accuracy and completeness, and I agree with the above. Brunetta Genera MD MS

## 2018-02-07 ENCOUNTER — Inpatient Hospital Stay: Payer: 59 | Attending: Hematology and Oncology

## 2018-02-07 ENCOUNTER — Inpatient Hospital Stay: Payer: 59

## 2018-02-07 ENCOUNTER — Inpatient Hospital Stay (HOSPITAL_BASED_OUTPATIENT_CLINIC_OR_DEPARTMENT_OTHER): Payer: 59 | Admitting: Hematology

## 2018-02-07 ENCOUNTER — Encounter: Payer: Self-pay | Admitting: Hematology

## 2018-02-07 ENCOUNTER — Telehealth: Payer: Self-pay | Admitting: Hematology

## 2018-02-07 VITALS — BP 128/68 | HR 69 | Temp 98.6°F | Resp 18 | Ht 62.5 in | Wt 125.5 lb

## 2018-02-07 VITALS — BP 120/76 | HR 88 | Temp 98.3°F | Resp 16

## 2018-02-07 DIAGNOSIS — C8307 Small cell B-cell lymphoma, spleen: Secondary | ICD-10-CM | POA: Insufficient documentation

## 2018-02-07 DIAGNOSIS — Z5112 Encounter for antineoplastic immunotherapy: Secondary | ICD-10-CM | POA: Insufficient documentation

## 2018-02-07 DIAGNOSIS — D61818 Other pancytopenia: Secondary | ICD-10-CM | POA: Insufficient documentation

## 2018-02-07 DIAGNOSIS — R161 Splenomegaly, not elsewhere classified: Secondary | ICD-10-CM

## 2018-02-07 DIAGNOSIS — Z7189 Other specified counseling: Secondary | ICD-10-CM

## 2018-02-07 LAB — CMP (CANCER CENTER ONLY)
ALT: 15 U/L (ref 0–55)
AST: 18 U/L (ref 5–34)
Albumin: 4.4 g/dL (ref 3.5–5.0)
Alkaline Phosphatase: 65 U/L (ref 40–150)
Anion gap: 6 (ref 3–11)
BUN: 20 mg/dL (ref 7–26)
CO2: 24 mmol/L (ref 22–29)
Calcium: 9.4 mg/dL (ref 8.4–10.4)
Chloride: 110 mmol/L — ABNORMAL HIGH (ref 98–109)
Creatinine: 0.74 mg/dL (ref 0.60–1.10)
GFR, Est AFR Am: >60 mL/min (ref >60)
GFR, Estimated: >60 mL/min (ref >60)
Glucose, Bld: 109 mg/dL (ref 70–140)
Potassium: 4 mmol/L (ref 3.5–5.1)
Sodium: 140 mmol/L (ref 136–145)
Total Bilirubin: 1.2 mg/dL (ref 0.2–1.2)
Total Protein: 6.3 g/dL — ABNORMAL LOW (ref 6.4–8.3)

## 2018-02-07 LAB — CBC WITH DIFFERENTIAL (CANCER CENTER ONLY)
Basophils Absolute: 0 10*3/uL (ref 0.0–0.1)
Basophils Relative: 1 %
Eosinophils Absolute: 0.2 10*3/uL (ref 0.0–0.5)
Eosinophils Relative: 6 %
HCT: 36.8 % (ref 34.8–46.6)
Hemoglobin: 12.3 g/dL (ref 11.6–15.9)
Lymphocytes Relative: 20 %
Lymphs Abs: 0.8 10*3/uL — ABNORMAL LOW (ref 0.9–3.3)
MCH: 27.2 pg (ref 25.1–34.0)
MCHC: 33.4 g/dL (ref 31.5–36.0)
MCV: 81.4 fL (ref 79.5–101.0)
Monocytes Absolute: 0.3 10*3/uL (ref 0.1–0.9)
Monocytes Relative: 8 %
Neutro Abs: 2.7 10*3/uL (ref 1.5–6.5)
Neutrophils Relative %: 65 %
Platelet Count: 158 10*3/uL (ref 145–400)
RBC: 4.52 MIL/uL (ref 3.70–5.45)
RDW: 17.6 % — ABNORMAL HIGH (ref 11.2–14.5)
WBC Count: 4.1 10*3/uL (ref 3.9–10.3)

## 2018-02-07 LAB — URIC ACID: Uric Acid, Serum: 5.4 mg/dL (ref 2.6–7.4)

## 2018-02-07 LAB — LACTATE DEHYDROGENASE: LDH: 249 U/L — ABNORMAL HIGH (ref 125–245)

## 2018-02-07 LAB — MAGNESIUM: Magnesium: 2.1 mg/dL (ref 1.7–2.4)

## 2018-02-07 LAB — PHOSPHORUS: Phosphorus: 5.1 mg/dL — ABNORMAL HIGH (ref 2.5–4.6)

## 2018-02-07 MED ORDER — FAMOTIDINE 20 MG PO TABS
20.0000 mg | ORAL_TABLET | Freq: Once | ORAL | Status: AC
  Administered 2018-02-07: 20 mg via ORAL

## 2018-02-07 MED ORDER — ACETAMINOPHEN 325 MG PO TABS
ORAL_TABLET | ORAL | Status: AC
Start: 2018-02-07 — End: 2018-02-07
  Filled 2018-02-07: qty 2

## 2018-02-07 MED ORDER — FAMOTIDINE 20 MG PO TABS
ORAL_TABLET | ORAL | Status: AC
  Filled 2018-02-07: qty 1

## 2018-02-07 MED ORDER — SODIUM CHLORIDE 0.9 % IV SOLN
375.0000 mg/m2 | Freq: Once | INTRAVENOUS | Status: AC
  Administered 2018-02-07: 600 mg via INTRAVENOUS
  Filled 2018-02-07: qty 50

## 2018-02-07 MED ORDER — DIPHENHYDRAMINE HCL 25 MG PO CAPS
ORAL_CAPSULE | ORAL | Status: AC
Start: 2018-02-07 — End: 2018-02-07
  Filled 2018-02-07: qty 2

## 2018-02-07 MED ORDER — ACETAMINOPHEN 325 MG PO TABS
650.0000 mg | ORAL_TABLET | Freq: Once | ORAL | Status: AC
  Administered 2018-02-07: 650 mg via ORAL

## 2018-02-07 MED ORDER — DIPHENHYDRAMINE HCL 25 MG PO CAPS
50.0000 mg | ORAL_CAPSULE | Freq: Once | ORAL | Status: AC
  Administered 2018-02-07: 50 mg via ORAL

## 2018-02-07 MED ORDER — SODIUM CHLORIDE 0.9 % IV SOLN
Freq: Once | INTRAVENOUS | Status: AC
  Administered 2018-02-07: 12:00:00 via INTRAVENOUS

## 2018-02-07 MED ORDER — METHYLPREDNISOLONE SODIUM SUCC 125 MG IJ SOLR
INTRAMUSCULAR | Status: AC
  Filled 2018-02-07: qty 2

## 2018-02-07 MED ORDER — METHYLPREDNISOLONE SODIUM SUCC 125 MG IJ SOLR
125.0000 mg | Freq: Every day | INTRAMUSCULAR | Status: DC
  Administered 2018-02-07: 125 mg via INTRAVENOUS

## 2018-02-07 NOTE — Telephone Encounter (Signed)
Scheduled appt per 5/3 los lab and f/u in 3 wks . Pt aware of appts - no print out wanted - my chart active.

## 2018-02-07 NOTE — Patient Instructions (Signed)
[facility] Discharge Instructions for Patients Receiving Chemotherapy  Today you received the following chemotherapy agents Rituxan.  To help prevent nausea and vomiting after your treatment, we encourage you to take your nausea medication as prescribed.   If you develop nausea and vomiting that is not controlled by your nausea medication, call the clinic.   BELOW ARE SYMPTOMS THAT SHOULD BE REPORTED IMMEDIATELY:  *FEVER GREATER THAN 100.5 F  *CHILLS WITH OR WITHOUT FEVER  NAUSEA AND VOMITING THAT IS NOT CONTROLLED WITH YOUR NAUSEA MEDICATION  *UNUSUAL SHORTNESS OF BREATH  *UNUSUAL BRUISING OR BLEEDING  TENDERNESS IN MOUTH AND THROAT WITH OR WITHOUT PRESENCE OF ULCERS  *URINARY PROBLEMS  *BOWEL PROBLEMS  UNUSUAL RASH Items with * indicate a potential emergency and should be followed up as soon as possible.  Feel free to call the clinic should you have any questions or concerns. The clinic phone number is [phone].  Please show the CHEMO ALERT CARD at check-in to the Emergency Department and triage nurse.  Rituximab injection What is this medicine? RITUXIMAB (ri TUX i mab) is a monoclonal antibody. It is used to treat certain types of cancer like non-Hodgkin lymphoma and chronic lymphocytic leukemia. It is also used to treat rheumatoid arthritis, granulomatosis with polyangiitis (or Wegener's granulomatosis), and microscopic polyangiitis. This medicine may be used for other purposes; ask your health care provider or pharmacist if you have questions. COMMON BRAND NAME(S): Rituxan What should I tell my health care provider before I take this medicine? They need to know if you have any of these conditions: -heart disease -infection (especially a virus infection such as hepatitis B, chickenpox, cold sores, or herpes) -immune system problems -irregular heartbeat -kidney disease -lung or breathing disease, like asthma -recently received or scheduled to  receive a vaccine -an unusual or allergic reaction to rituximab, mouse proteins, other medicines, foods, dyes, or preservatives -pregnant or trying to get pregnant -breast-feeding How should I use this medicine? This medicine is for infusion into a vein. It is administered in a hospital or clinic by a specially trained health care professional. A special MedGuide will be given to you by the pharmacist with each prescription and refill. Be sure to read this information carefully each time. Talk to your pediatrician regarding the use of this medicine in children. This medicine is not approved for use in children. Overdosage: If you think you have taken too much of this medicine contact a poison control center or emergency room at once. NOTE: This medicine is only for you. Do not share this medicine with others. What if I miss a dose? It is important not to miss a dose. Call your doctor or health care professional if you are unable to keep an appointment. What may interact with this medicine? -cisplatin -other medicines for arthritis like disease modifying antirheumatic drugs or tumor necrosis factor inhibitors -live virus vaccines This list may not describe all possible interactions. Give your health care provider a list of all the medicines, herbs, non-prescription drugs, or dietary supplements you use. Also tell them if you smoke, drink alcohol, or use illegal drugs. Some items may interact with your medicine. What should I watch for while using this medicine? Your condition will be monitored carefully while you are receiving this medicine. You may need blood work done while you are taking this medicine. This medicine can cause serious allergic reactions. To reduce your risk you may need to take medicine before treatment with this medicine. Take your medicine   as directed. In some patients, this medicine may cause a serious brain infection that may cause death. If you have any problems seeing,  thinking, speaking, walking, or standing, tell your doctor right away. If you cannot reach your doctor, urgently seek other source of medical care. Call your doctor or health care professional for advice if you get a fever, chills or sore throat, or other symptoms of a cold or flu. Do not treat yourself. This drug decreases your body's ability to fight infections. Try to avoid being around people who are sick. Do not become pregnant while taking this medicine or for 12 months after stopping it. Women should inform their doctor if they wish to become pregnant or think they might be pregnant. There is a potential for serious side effects to an unborn child. Talk to your health care professional or pharmacist for more information. What side effects may I notice from receiving this medicine? Side effects that you should report to your doctor or health care professional as soon as possible: -breathing problems -chest pain -dizziness or feeling faint -fast, irregular heartbeat -low blood counts - this medicine may decrease the number of white blood cells, red blood cells and platelets. You may be at increased risk for infections and bleeding. -mouth sores -redness, blistering, peeling or loosening of the skin, including inside the mouth (this can be added for any serious or exfoliative rash that could lead to hospitalization) -signs of infection - fever or chills, cough, sore throat, pain or difficulty passing urine -signs and symptoms of kidney injury like trouble passing urine or change in the amount of urine -signs and symptoms of liver injury like dark yellow or brown urine; general ill feeling or flu-like symptoms; light-colored stools; loss of appetite; nausea; right upper belly pain; unusually weak or tired; yellowing of the eyes or skin -stomach pain -vomiting Side effects that usually do not require medical attention (report to your doctor or health care professional if they continue or are  bothersome): -headache -joint pain -muscle cramps or muscle pain This list may not describe all possible side effects. Call your doctor for medical advice about side effects. You may report side effects to FDA at 1-800-FDA-1088. Where should I keep my medicine? This drug is given in a hospital or clinic and will not be stored at home. NOTE: This sheet is a summary. It may not cover all possible information. If you have questions about this medicine, talk to your doctor, pharmacist, or health care provider.  2018 Elsevier/Gold Standard (2016-05-02 15:28:09)  

## 2018-02-10 NOTE — Progress Notes (Signed)
FMLA successfully faxed to [place] to [facility] at [phone]. Mailed a copy to patient address on file.

## 2018-02-25 ENCOUNTER — Telehealth: Payer: Self-pay | Admitting: Hematology

## 2018-02-25 NOTE — Telephone Encounter (Signed)
Faxed [employer] to [employer] on 02/25/18, Release ID [id number]

## 2018-02-27 NOTE — Progress Notes (Signed)
HEMATOLOGY/ONCOLOGY CLINIC NOTE  Date of Service: 02/28/18  Patient Care Team: Hulan Fess, MD as PCP - General (Family Medicine)  CHIEF COMPLAINTS/PURPOSE OF CONSULTATION:  F/u for mx of SMZL  HISTORY OF PRESENTING ILLNESS:   Emily Foley is a wonderful 62 y.o. 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 She is accompanied today by three members of her family. The pt reports that she is doing well overall.   The pt notes that on March 6 she developed a low-grade fever and continues to have a fever. She has kept her fever at [place] with Tylenol every 6 hours. She notes that today she has felt the least feverish since March 6.  She saw her PCP Dr. Hulan Fess who ruled out influenza and UTI. She has also seen rheumatology over the last couple weeks and had a rheumatological workup that did not show overt evidence of a primary autoimmune condition. She was noted to be neg for ANA, RF and CCP Ab. Interesting was noted to have low C4 levels.  She notes that her resting HR has been elevated and describes her heart rate as tachycardic. She had an ECHO on 12/26/17 which showed a normal EF, and was then referred to see infectious disease: CMV and TB Quantiferon were negative.  She notes that the ID team told her there was no clear evidence of an infectious process. TTE no vegetation. BL Bcx neg.  She notes that last week she developed a cough, but believes it to be allergy related. She notes that she has had an enlarged spleen since at least October 2018 revealed by a 07/31/17 US Abdomen. She notes some abdominal discomfort, and has felt that she gets full quickly after eating.  In the last year she intentionally dropped about 20 lbs of weight with change in her diet and avoiding most carbs. She notes that in the last 3 weeks, since she began feeling ill this month, she has lost about 6-7 lbs.    She also notes that she has been having night sweats that have returned this month after a few months of night sweats last year which abated in August 2018. She notes no other signs of recent infection, and denies any travel outside of the country. She denies any concern of insect bites.   She notes that in [place] she was on iron pills for low Hgb, which increased her Hgb. She stopped taking after her Hgb normalized.  She notes that in March 2018 she had a bad stomach bug and in December 2017 she had influenza.  She notes that she has been under a lot of stress with the deaths of her mother and step-father in a couple weeks of one another.   The pt reports that she has seen rheumatology twice for her hand, knee and hip pain but was confirmed to have osteoarthritis as opposed to rheumatoid arthritis. She notes that the severity of her joint pains mirror the content of her diet. She notes that her joint have never been red or swollen.   She notes that she will be having an iron infusion on 01/03/18.  Of note prior to the patient's visit today, pt has had BM Bx completed on 12/27/17 with results revealing HYPERCELLULAR BONE MARROW FOR AGE WITH NUMEROUS ATYPICAL LYMPHOID AGGREGATES. - TRILINEAGE HEMATOPOIESIS. On 12/27/17 the patient's flow cytometry revealed A MINOR MONOCLONAL B-CELL POPULATION  IDENTIFIED.  Flow cytometric analysis shows a minor population of monoclonal B cells (17% of all lymphocytes) expressing pan B-cell antigens including CD20 with lack of CD5, CD10, CD25 or CD103 expression. Immunohistochemical stains were also performed and show that the lymphoid aggregates show a mixture of T and B cells with slight predominance of T cells. No significant cyclin D1, CD34, or TdT positivity is identified. The overall findings are limited but slightly favor involvement by a B-cell lymphoproliferative process. Consideration was given to marginal zone lymphoma and splenic lymphoma  especially in the presence of splenomegaly. Nonetheless, additional material (ie lymph node or tissue biopsy) is strongly recommended for further evaluation.  I discussed the BM Bx results with Dr Gari Crown -- overall BM Bx + clinical picture certainly concerning for SMZL with possible coombs neg AIHA and possible autoimmune phenomenon related to [place]?Marland Kitchen  Most recent lab results (12/30/17) of CBC  is as follows: all values are WNL except for WBC at 3.5k, RBC at 3.18, Hgb at 7.6, HCT at 24.4, MCV at 76.7, MCH at 23.9, MCHC at 31.1, RDW at 17.0. LDH 12/30/17 was at 616. Haptoglobin<10 -- suggestive of hemolysis. C-reactive protein 09/10/17 was elevated at 20.5.   On review of systems, pt reports low-grade fever, night sweats, some fatigue, tachycardia, occasional lightheadedness, and denies dizziness, weakness, noticing any enlarged lymph nodes, changes in bowel habits, abdominal pains, abnormal vaginal discharge, and any other symptoms.   On PMHx the pt reports high blood pressure and degenerative arthritis. On Family Hx the pt reports her father had rheumatoid arthritis, her mother had kidney disease.  Interval History:  Emily Foley returns today regarding her recently diagnosed Splenic Marginal Zone Lymphoma. The patient's last visit with [place] was on 02/07/18. The pt reports that she is doing well overall. She completed her Rituxan weekly x 4 doses on 02/07/2018.  The pt reports that her activity has increased and is feeling somewhat tired after this. She notes that she is also eating an anti-inflammatory diet and reports a very strong appetite.   Lab results today (02/28/18) of CBC and Reticulocytes is as follows: all values are WNL except for WBC at 3.1k, RDW at 16.8, Lymphs Abs at 0.7k, Retic ct pct at 3.0%, Retic ct abs at 135.0. CMP 02/28/18 is WNL LDH 02/28/18 is now WNL at 238 C3 and C4 02/28/18 are pending.   On review of systems, pt reports strong appetite, increased activity, and denies fevers,  chills, night sweats, mouth sores, new joint pains, abdominal pains, problems moving her bowels, leg swelling, red/swollen joints, and any other symptoms.    MEDICAL HISTORY:  Past Medical History:  Diagnosis Date  . Arthritis   . Hypertension     SURGICAL HISTORY: No past surgical history on file.  SOCIAL HISTORY: Social History   Socioeconomic History  . Marital status: Single    Spouse name: Not on file  . Number of children: Not on file  . Years of education: Not on file  . Highest education level: Not on file  Occupational History  . Not on file  Social Needs  . Financial resource strain: Not on file  . Food insecurity:    Worry: Not on file    Inability: Not on file  . Transportation needs:    Medical: Not on file    Non-medical: Not on file  Tobacco Use  . Smoking status: Never Smoker  . Smokeless tobacco: Never Used  Substance and Sexual Activity  . Alcohol use:  Not Currently    Alcohol/week: 0.0 oz  . Drug use: Never  . Sexual activity: Not on file  Lifestyle  . Physical activity:    Days per week: Not on file    Minutes per session: Not on file  . Stress: Not on file  Relationships  . Social connections:    Talks on phone: Not on file    Gets together: Not on file    Attends religious service: Not on file    Active member of club or organization: Not on file    Attends meetings of clubs or organizations: Not on file    Relationship status: Not on file  . Intimate partner violence:    Fear of current or ex partner: Not on file    Emotionally abused: Not on file    Physically abused: Not on file    Forced sexual activity: Not on file  Other Topics Concern  . Not on file  Social History Narrative  . Not on file    FAMILY HISTORY: Family History  Problem Relation Age of Onset  . Diabetes Mother   . Hyperlipidemia Mother   . Hypertension Mother     ALLERGIES:  is allergic to biaxin [clarithromycin].  MEDICATIONS:  Current Outpatient  Medications  Medication Sig Dispense Refill  . folic acid (FOLVITE) 951 MCG tablet Take 400 mcg by mouth daily.    . vitamin B-12 (CYANOCOBALAMIN) 1000 MCG tablet Take 1,000 mcg by mouth daily.     No current facility-administered medications for this visit.     REVIEW OF SYSTEMS:    A 10+ POINT REVIEW OF SYSTEMS WAS OBTAINED including neurology, dermatology, psychiatry, cardiac, respiratory, lymph, extremities, GI, GU, Musculoskeletal, constitutional, breasts, reproductive, HEENT.  All pertinent positives are noted in the HPI.  All others are negative.    PHYSICAL EXAMINATION: ECOG PERFORMANCE STATUS: 1 - Symptomatic but completely ambulatory  Vitals:   02/28/18 0904  BP: 136/74  Pulse: 71  Resp: 18  Temp: 98.6 F (37 C)  SpO2: 99%   Filed Weights   02/28/18 0904  Weight: 128 lb 1.6 oz (58.1 kg)   .Body mass index is 23.06 kg/m.  GENERAL:alert, in no acute distress and comfortable SKIN: no acute rashes, no significant lesions EYES: conjunctiva are pink and non-injected, sclera anicteric OROPHARYNX: MMM, no exudates, no oropharyngeal erythema or ulceration NECK: supple, no JVD LYMPH:  no palpable lymphadenopathy in the cervical, axillary or inguinal regions LUNGS: clear to auscultation b/l with normal respiratory effort HEART: regular rate & rhythm ABDOMEN:  normoactive bowel sounds , non tender, not distended. Extremity: no pedal edema PSYCH: alert & oriented x 3 with fluent speech NEURO: no focal motor/sensory deficits   LABORATORY DATA:  I have reviewed the data as listed  . CBC Latest Ref Rng & Units 02/28/2018 02/07/2018 01/31/2018  WBC 3.9 - 10.3 K/uL 3.1(L) 4.1 4.1  Hemoglobin 11.6 - 15.9 g/dL 12.6 12.3 11.7  Hematocrit 34.8 - 46.6 % 37.2 36.8 35.5  Platelets 145 - 400 K/uL 168 158 147   . CBC    Component Value Date/Time   WBC 3.1 (L) 02/28/2018 0816   WBC 2.5 (L) 01/17/2018 0900   RBC 4.50 02/28/2018 0816   RBC 4.50 02/28/2018 0816   HGB 12.6  02/28/2018 0816   HGB 12.8 09/10/2017 0801   HCT 37.2 02/28/2018 0816   HCT 39.4 09/10/2017 0801   PLT 168 02/28/2018 0816   PLT 144 (L) 09/10/2017 0801   MCV  82.7 02/28/2018 0816   MCV 79.0 (L) 09/10/2017 0801   MCH 28.0 02/28/2018 0816   MCHC 33.9 02/28/2018 0816   RDW 16.8 (H) 02/28/2018 0816   RDW 15.6 (H) 09/10/2017 0801   LYMPHSABS 0.7 (L) 02/28/2018 0816   LYMPHSABS 0.9 09/10/2017 0801   MONOABS 0.3 02/28/2018 0816   MONOABS 0.4 09/10/2017 0801   EOSABS 0.1 02/28/2018 0816   EOSABS 0.1 09/10/2017 0801   BASOSABS 0.0 02/28/2018 0816   BASOSABS 0.0 09/10/2017 0801    . CMP Latest Ref Rng & Units 02/28/2018 02/07/2018 01/31/2018  Glucose 70 - 140 mg/dL 102 109 103  BUN 7 - 26 mg/dL 21 20 22   Creatinine 0.60 - 1.10 mg/dL 0.77 0.74 0.78  Sodium 136 - 145 mmol/L 140 140 140  Potassium 3.5 - 5.1 mmol/L 3.9 4.0 4.4  Chloride 98 - 109 mmol/L 109 110(H) 108  CO2 22 - 29 mmol/L 22 24 23   Calcium 8.4 - 10.4 mg/dL 9.1 9.4 9.6  Total Protein 6.4 - 8.3 g/dL 6.2(L) 6.3(L) 6.3(L)  Total Bilirubin 0.2 - 1.2 mg/dL 0.9 1.2 1.1  Alkaline Phos 40 - 150 U/L 67 65 69  AST 5 - 34 U/L 17 18 15   ALT 0 - 55 U/L 17 15 12    . Lab Results  Component Value Date   LDH 238 02/28/2018     Component     Latest Ref Rng & Units 12/27/2017 01/01/2018  Color, Urine     YELLOW  YELLOW  Appearance     CLEAR  CLEAR  Specific Gravity, Urine     1.005 - 1.030  1.020  pH     5.0 - 8.0  5.0  Glucose     NEGATIVE mg/dL  NEGATIVE  Hgb urine dipstick     NEGATIVE  NEGATIVE  Bilirubin Urine     NEGATIVE  NEGATIVE  Ketones, ur     NEGATIVE mg/dL  NEGATIVE  Protein     NEGATIVE mg/dL  NEGATIVE  Nitrite     NEGATIVE  NEGATIVE  Leukocytes, UA     NEGATIVE  TRACE (A)  RBC / HPF     0 - 5 RBC/hpf  0-5  WBC, UA     0 - 5 WBC/hpf  0-5  Bacteria, UA     NONE SEEN  NONE SEEN  Squamous Epithelial / LPF     NONE SEEN  0-5 (A)  Mucus       PRESENT  Hyaline Casts, UA       PRESENT  IgG (Immunoglobin  G), Serum     700 - 1,600 mg/dL  442 (L)  IgA     87 - 352 mg/dL  69 (L)  IgM (Immunoglobulin M), Srm     26 - 217 mg/dL  20 (L)  Total Protein ELP     6.0 - 8.5 g/dL  6.0  Albumin SerPl Elph-Mcnc     2.9 - 4.4 g/dL  3.7  Alpha 1     0.0 - 0.4 g/dL  0.4  Alpha2 Glob SerPl Elph-Mcnc     0.4 - 1.0 g/dL  0.6  B-Globulin SerPl Elph-Mcnc     0.7 - 1.3 g/dL  0.9  Gamma Glob SerPl Elph-Mcnc     0.4 - 1.8 g/dL  0.4  M Protein SerPl Elph-Mcnc     Not Observed g/dL  Not Observed  Globulin, Total     2.2 - 3.9 g/dL  2.3  Albumin/Glob SerPl  0.7 - 1.7  1.7  IFE 1       Comment  Please Note (HCV):       Comment  QuantiFERON-TB Gold Plus     NEGATIVE NEGATIVE   NIL     IU/mL 0.12   Mitogen-NIL     IU/mL 3.06   TB1-NIL     IU/mL <0.00   TB2-NIL     IU/mL <0.00   Iron     41 - 142 ug/dL 28 (L) 39 (L)  TIBC     236 - 444 ug/dL 244 266  Saturation Ratios     21 - 57 % 11 (L) 15 (L)  UIBC     ug/dL 216 227  Retic Ct Pct     0.7 - 2.1 %  4.1 (H)  RBC.     3.70 - 5.45 MIL/uL  2.96 (L)  Retic Count, Absolute     33.7 - 90.7 K/uL  121.4 (H)  C3 Complement     82 - 167 mg/dL  135  Complement C4, Body Fluid     14 - 44 mg/dL  <2 (L)  DAT, complement       NEG  DAT, IgG       NEG . . .  CMV IgM     AU/mL <30.00   Ferritin     9 - 269 ng/mL 474 (H)   Hemosiderin, Urine     Negative  Negative  Hep B Core Ab, Tot     Negative  Negative  Hepatitis B Surface Ag     Negative  Negative  HCV Ab     0.0 - 0.9 s/co ratio  <0.1  Haptoglobin     34 - 200 mg/dL  <10 (L)  LDH     125 - 245 U/L  591 (H)  Antibody Screen       NEG . . .     12/27/17 BM Bx:   12/27/17 Flow Cytometry:     RADIOGRAPHIC STUDIES: I have personally reviewed the radiological images as listed and agreed with the findings in the report. No results found.  ASSESSMENT & PLAN:   62 y.o. female with  1. Pancytopenia (resolving) due to recently diagnosed Splenic marginal zone  lymphoma. Patient appeared to be have significant constitutional symptoms . No evidence of rheumatological or infectious etiology for her fevers and night sweats. Constitutional symptoms now resolved.  2. Splenomegaly - likely due to [place]. PET shows some hypersplenism.  3. Anemia - with elevated LDH and low haptoglobin concerning for hemolysis. Coombs neg . Could be IgA driven Coombs neg AIHA. +ve reticulocytosis and Smear with some microspherocytes. No over urine hemosiderinuria. Partly anemia could also be from hypersplenism.  hgb stable/improved at 12.6  4. Low C4 ? Immune complex disease/autoimmunity related to lymphoma. Per rheumatology - no evidence of primary rheumatological disorder. No overt evidence of endocarditis. C3 - WNL C4 improving from <2 to 7 5. Fevers/chills/night sweat -- likely constitutional symptoms related to ?lymphoma - now resolved. 6. Thrombocytopenia likely from splenomegaly vs lymphoma-- resolved. PLT now WNL at 168k  PLAN  -Discussed pt labwork today, 02/28/18; blood counts are stable.  -Will continue to watch for constitutional symptoms -Recommend eating well and staying hydrated -Spleen is no longer palpably enlarged, will proceed with US Abdomen prior to next visit -Maintenance Rituxan will be decided upon after [place] -Will see pt back in 6-8 weeks with labs and [place], unless any new concerns develop before  then  -Recommend continuing B complex and Folic Acid supplement to support accelerated hematopoiesis -Pt is okay to continue prn Tylenol as an antipyretic, but should avoid NSAIDs at this time.  [place] abd in 7 weeks RTC with Dr Irene Limbo in 8 weeks with labs  All of the  patients questions were answered with apparent satisfaction. The patient knows to call the clinic with any problems, questions or concerns.  . The total time spent in the appointment was 25 minutes and more than 50% was on counseling and direct patient cares.       Sullivan Lone MD [place] AAHIVMS  [facility] Hematology/Oncology Physician [facility]  (Office):       [phone] (Work cell):  [phone] (Fax):           [phone]  02/28/2018 9:36 AM  This document serves as a record of services personally performed by Sullivan Lone, MD. It was created on his behalf by Baldwin Jamaica, a trained medical scribe. The creation of this record is based on the scribe's personal observations and the provider's statements to them.   .I have reviewed the above documentation for accuracy and completeness, and I agree with the above. Brunetta Genera MD MS

## 2018-02-28 ENCOUNTER — Encounter: Payer: Self-pay | Admitting: Hematology

## 2018-02-28 ENCOUNTER — Inpatient Hospital Stay: Payer: 59

## 2018-02-28 ENCOUNTER — Inpatient Hospital Stay (HOSPITAL_BASED_OUTPATIENT_CLINIC_OR_DEPARTMENT_OTHER): Payer: 59 | Admitting: Hematology

## 2018-02-28 ENCOUNTER — Telehealth: Payer: Self-pay | Admitting: Hematology

## 2018-02-28 VITALS — BP 136/74 | HR 71 | Temp 98.6°F | Resp 18 | Ht 62.5 in | Wt 128.1 lb

## 2018-02-28 DIAGNOSIS — C8307 Small cell B-cell lymphoma, spleen: Secondary | ICD-10-CM

## 2018-02-28 DIAGNOSIS — D61818 Other pancytopenia: Secondary | ICD-10-CM

## 2018-02-28 DIAGNOSIS — D509 Iron deficiency anemia, unspecified: Secondary | ICD-10-CM

## 2018-02-28 DIAGNOSIS — Z5112 Encounter for antineoplastic immunotherapy: Secondary | ICD-10-CM | POA: Diagnosis not present

## 2018-02-28 DIAGNOSIS — D696 Thrombocytopenia, unspecified: Secondary | ICD-10-CM

## 2018-02-28 DIAGNOSIS — R161 Splenomegaly, not elsewhere classified: Secondary | ICD-10-CM

## 2018-02-28 LAB — CBC WITH DIFFERENTIAL (CANCER CENTER ONLY)
Basophils Absolute: 0 10*3/uL (ref 0.0–0.1)
Basophils Relative: 1 %
Eosinophils Absolute: 0.1 10*3/uL (ref 0.0–0.5)
Eosinophils Relative: 4 %
HCT: 37.2 % (ref 34.8–46.6)
Hemoglobin: 12.6 g/dL (ref 11.6–15.9)
Lymphocytes Relative: 24 %
Lymphs Abs: 0.7 10*3/uL — ABNORMAL LOW (ref 0.9–3.3)
MCH: 28 pg (ref 25.1–34.0)
MCHC: 33.9 g/dL (ref 31.5–36.0)
MCV: 82.7 fL (ref 79.5–101.0)
Monocytes Absolute: 0.3 10*3/uL (ref 0.1–0.9)
Monocytes Relative: 10 %
Neutro Abs: 2 10*3/uL (ref 1.5–6.5)
Neutrophils Relative %: 61 %
Platelet Count: 168 10*3/uL (ref 145–400)
RBC: 4.5 MIL/uL (ref 3.70–5.45)
RDW: 16.8 % — ABNORMAL HIGH (ref 11.2–14.5)
WBC Count: 3.1 10*3/uL — ABNORMAL LOW (ref 3.9–10.3)

## 2018-02-28 LAB — CMP (CANCER CENTER ONLY)
ALT: 17 U/L (ref 0–55)
AST: 17 U/L (ref 5–34)
Albumin: 4.3 g/dL (ref 3.5–5.0)
Alkaline Phosphatase: 67 U/L (ref 40–150)
Anion gap: 9 (ref 3–11)
BUN: 21 mg/dL (ref 7–26)
CO2: 22 mmol/L (ref 22–29)
Calcium: 9.1 mg/dL (ref 8.4–10.4)
Chloride: 109 mmol/L (ref 98–109)
Creatinine: 0.77 mg/dL (ref 0.60–1.10)
GFR, Est AFR Am: >60 mL/min (ref >60)
GFR, Estimated: >60 mL/min (ref >60)
Glucose, Bld: 102 mg/dL (ref 70–140)
Potassium: 3.9 mmol/L (ref 3.5–5.1)
Sodium: 140 mmol/L (ref 136–145)
Total Bilirubin: 0.9 mg/dL (ref 0.2–1.2)
Total Protein: 6.2 g/dL — ABNORMAL LOW (ref 6.4–8.3)

## 2018-02-28 LAB — RETICULOCYTES
RBC.: 4.5 MIL/uL (ref 3.70–5.45)
Retic Count, Absolute: 135 10*3/uL — ABNORMAL HIGH (ref 33.7–90.7)
Retic Ct Pct: 3 % — ABNORMAL HIGH (ref 0.7–2.1)

## 2018-02-28 LAB — LACTATE DEHYDROGENASE: LDH: 238 U/L (ref 125–245)

## 2018-02-28 NOTE — Patient Instructions (Signed)
Thank you for choosing [facility] to provide your oncology and hematology care.  To afford each patient quality time with our providers, please arrive 30 minutes before your scheduled appointment time.  If you arrive late for your appointment, you may be asked to reschedule.  We strive to give you quality time with our providers, and arriving late affects you and other patients whose appointments are after yours.   If you are a no show for multiple scheduled visits, you may be dismissed from the clinic at the providers discretion.    Again, thank you for choosing [facility], our hope is that these requests will decrease the amount of time that you wait before being seen by our physicians.  ______________________________________________________________________  Should you have questions after your visit to the  Cancer Center, please contact our office at [phone] between the hours of 8:30 and 4:30 p.m.    Voicemails left after 4:30p.m will not be returned until the following business day.    For prescription refill requests, please have your pharmacy contact us directly.  Please also try to allow 48 hours for prescription requests.    Please contact the scheduling department for questions regarding scheduling.  For scheduling of procedures such as PET scans, CT scans, MRI, Ultrasound, etc please contact central scheduling at [phone].    Resources For Cancer Patients and Caregivers:   Oncolink.org:  A wonderful resource for patients and healthcare providers for information regarding your disease, ways to tract your treatment, what to expect, etc.     American Cancer Society:  800-227-2345  Can help patients locate various types of support and financial assistance  Cancer Care: 1-800-813-HOPE (4673) Provides financial assistance, online support groups, medication/co-pay assistance.    [place] DSS:  336-641-3447 Where to apply for food  stamps, Medicaid, and utility assistance  Medicare Rights Center: 800-333-4114 Helps people with Medicare understand their rights and benefits, navigate the Medicare system, and secure the quality healthcare they deserve  SCAT: 336-333-6589 [place] [employer]'s shared-ride transportation service for eligible riders who have a disability that prevents them from riding the fixed route bus.    For additional information on assistance programs please contact our social worker:   Grier Hock/Abigail Elmore:  [phone]            

## 2018-02-28 NOTE — Telephone Encounter (Signed)
Scheduled appt per 5/24 los - pt is aware - my chart active - no print out wanted.

## 2018-03-01 LAB — C3 AND C4
C3 Complement: 105 mg/dL (ref 82–167)
Complement C4, Body Fluid: 7 mg/dL — ABNORMAL LOW (ref 14–44)

## 2018-03-05 ENCOUNTER — Encounter: Payer: Self-pay | Admitting: Hematology

## 2018-03-25 ENCOUNTER — Other Ambulatory Visit: Payer: 59

## 2018-03-25 ENCOUNTER — Ambulatory Visit: Payer: 59 | Admitting: Hematology and Oncology

## 2018-04-18 ENCOUNTER — Ambulatory Visit (HOSPITAL_COMMUNITY)
Admission: RE | Admit: 2018-04-18 | Discharge: 2018-04-18 | Disposition: A | Discharge status: Home / Self Care | Payer: 59 | Source: Ambulatory Visit | Attending: Hematology | Admitting: Hematology

## 2018-04-18 DIAGNOSIS — C8307 Small cell B-cell lymphoma, spleen: Secondary | ICD-10-CM | POA: Diagnosis not present

## 2018-04-18 DIAGNOSIS — R161 Splenomegaly, not elsewhere classified: Secondary | ICD-10-CM | POA: Insufficient documentation

## 2018-04-18 DIAGNOSIS — C8590 Non-Hodgkin lymphoma, unspecified, unspecified site: Secondary | ICD-10-CM | POA: Diagnosis not present

## 2018-04-22 ENCOUNTER — Encounter: Payer: Self-pay | Admitting: Hematology

## 2018-04-24 NOTE — Progress Notes (Signed)
HEMATOLOGY/ONCOLOGY CLINIC NOTE  Date of Service: 04/25/18    Patient Care Team: Hulan Fess, MD as PCP - General (Family Medicine)  CHIEF COMPLAINTS/PURPOSE OF CONSULTATION:  F/u for mx of SMZL  HISTORY OF PRESENTING ILLNESS:   Emily Foley is a wonderful 62 y.o. 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 She is accompanied today by three members of her family. The pt reports that she is doing well overall.   The pt notes that on March 6 she developed a low-grade fever and continues to have a fever. She has kept her fever at [place] with Tylenol every 6 hours. She notes that today she has felt the least feverish since March 6.  She saw her PCP Dr. Hulan Fess who ruled out influenza and UTI. She has also seen rheumatology over the last couple weeks and had a rheumatological workup that did not show overt evidence of a primary autoimmune condition. She was noted to be neg for ANA, RF and CCP Ab. Interesting was noted to have low C4 levels.  She notes that her resting HR has been elevated and describes her heart rate as tachycardic. She had an ECHO on 12/26/17 which showed a normal EF, and was then referred to see infectious disease: CMV and TB Quantiferon were negative.  She notes that the ID team told her there was no clear evidence of an infectious process. TTE no vegetation. BL Bcx neg.  She notes that last week she developed a cough, but believes it to be allergy related. She notes that she has had an enlarged spleen since at least October 2018 revealed by a 07/31/17 US Abdomen. She notes some abdominal discomfort, and has felt that she gets full quickly after eating.  In the last year she intentionally dropped about 20 lbs of weight with change in her diet and avoiding most carbs. She notes that in the last 3 weeks, since she began feeling ill this month, she has lost about 6-7 lbs.     She also notes that she has been having night sweats that have returned this month after a few months of night sweats last year which abated in August 2018. She notes no other signs of recent infection, and denies any travel outside of the country. She denies any concern of insect bites.   She notes that in [place] she was on iron pills for low Hgb, which increased her Hgb. She stopped taking after her Hgb normalized.  She notes that in March 2018 she had a bad stomach bug and in December 2017 she had influenza.  She notes that she has been under a lot of stress with the deaths of her mother and step-father in a couple weeks of one another.   The pt reports that she has seen rheumatology twice for her hand, knee and hip pain but was confirmed to have osteoarthritis as opposed to rheumatoid arthritis. She notes that the severity of her joint pains mirror the content of her diet. She notes that her joint have never been red or swollen.   She notes that she will be having an iron infusion on 01/03/18.  Of note prior to the patient's visit today, pt has had BM Bx completed on 12/27/17 with results revealing HYPERCELLULAR BONE MARROW FOR AGE WITH NUMEROUS ATYPICAL LYMPHOID AGGREGATES. - TRILINEAGE HEMATOPOIESIS. On 12/27/17 the patient's flow cytometry revealed A MINOR  MONOCLONAL B-CELL POPULATION IDENTIFIED.  Flow cytometric analysis shows a minor population of monoclonal B cells (17% of all lymphocytes) expressing pan B-cell antigens including CD20 with lack of CD5, CD10, CD25 or CD103 expression. Immunohistochemical stains were also performed and show that the lymphoid aggregates show a mixture of T and B cells with slight predominance of T cells. No significant cyclin D1, CD34, or TdT positivity is identified. The overall findings are limited but slightly favor involvement by a B-cell lymphoproliferative process. Consideration was given to marginal zone lymphoma and splenic lymphoma  especially in the presence of splenomegaly. Nonetheless, additional material (ie lymph node or tissue biopsy) is strongly recommended for further evaluation.  I discussed the BM Bx results with Dr Gari Crown -- overall BM Bx + clinical picture certainly concerning for SMZL with possible coombs neg AIHA and possible autoimmune phenomenon related to [place]?Marland Kitchen  Most recent lab results (12/30/17) of CBC  is as follows: all values are WNL except for WBC at 3.5k, RBC at 3.18, Hgb at 7.6, HCT at 24.4, MCV at 76.7, MCH at 23.9, MCHC at 31.1, RDW at 17.0. LDH 12/30/17 was at 616. Haptoglobin<10 -- suggestive of hemolysis. C-reactive protein 09/10/17 was elevated at 20.5.   On review of systems, pt reports low-grade fever, night sweats, some fatigue, tachycardia, occasional lightheadedness, and denies dizziness, weakness, noticing any enlarged lymph nodes, changes in bowel habits, abdominal pains, abnormal vaginal discharge, and any other symptoms.   On PMHx the pt reports high blood pressure and degenerative arthritis. On Family Hx the pt reports her father had rheumatoid arthritis, her mother had kidney disease.  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 The patient's last visit with [place] was on 02/28/18. She is accompanied today by her sister and brother. The pt reports that she is doing well overall. Verbal consent has been given by the pt for a clinical observer, Mathis Fare, to be present.   The pt reports that she took Celebrex two weeks ago to treat an acute joint pain that was similar to her previous joint pain in March 2019. She notes strong associations with particular foods, and since cutting them out of her diet has seen improvement in her symptoms. She notes that she has been evaluated for her joint pains in the past by a Rheumatologist. She notes that the past, extensive work up revealed osteoarthritis.    Of note since the patient's last visit, pt has had US Abdomen completed on 04/18/18 with results revealing Splenomegaly with a length of 13.3 cm and a volume of 432 cc. By report, the spleen measured up to 15.1 cm in maximum dimension on the PET-CT from January 08, 2018 suggesting interval improvement.  Lab results today (04/25/18) of CBC w/diff, CMP is as follows: all values are WNL except for Glucose at 112, BUN at 26. LDH 04/25/18 is . Lab Results  Component Value Date   LDH 236 (H) 04/25/2018   On review of systems, pt reports appreciated weight gain, knee and hip pain, good energy levels, and denies current abdominal pains, other joint pains, leg swelling, arm swelling, and any other symptoms.    MEDICAL HISTORY:  Past Medical History:  Diagnosis Date  . Arthritis   . Hypertension     SURGICAL HISTORY: History reviewed. No pertinent surgical history.  SOCIAL HISTORY: Social History   Socioeconomic History  . Marital status: Single    Spouse name:  Not on file  . Number of children: Not on file  . Years of education: Not on file  . Highest education level: Not on file  Occupational History  . Not on file  Social Needs  . Financial resource strain: Not on file  . Food insecurity:    Worry: Not on file    Inability: Not on file  . Transportation needs:    Medical: Not on file    Non-medical: Not on file  Tobacco Use  . Smoking status: Never Smoker  . Smokeless tobacco: Never Used  Substance and Sexual Activity  . Alcohol use: Not Currently    Alcohol/week: 0.0 oz  . Drug use: Never  . Sexual activity: Not on file  Lifestyle  . Physical activity:    Days per week: Not on file    Minutes per session: Not on file  . Stress: Not on file  Relationships  . Social connections:    Talks on phone: Not on file    Gets together: Not on file    Attends religious service: Not on file    Active member of club or organization: Not on file    Attends meetings of clubs or  organizations: Not on file    Relationship status: Not on file  . Intimate partner violence:    Fear of current or ex partner: Not on file    Emotionally abused: Not on file    Physically abused: Not on file    Forced sexual activity: Not on file  Other Topics Concern  . Not on file  Social History Narrative  . Not on file    FAMILY HISTORY: Family History  Problem Relation Age of Onset  . Diabetes Mother   . Hyperlipidemia Mother   . Hypertension Mother     ALLERGIES:  is allergic to biaxin [clarithromycin].  MEDICATIONS:  Current Outpatient Medications  Medication Sig Dispense Refill  . folic acid (FOLVITE) 354 MCG tablet Take 800 mcg by mouth daily.     . vitamin B-12 (CYANOCOBALAMIN) 1000 MCG tablet Take 1,000 mcg by mouth daily.     No current facility-administered medications for this visit.     REVIEW OF SYSTEMS:    A 10+ POINT REVIEW OF SYSTEMS WAS OBTAINED including neurology, dermatology, psychiatry, cardiac, respiratory, lymph, extremities, GI, GU, Musculoskeletal, constitutional, breasts, reproductive, HEENT.  All pertinent positives are noted in the HPI.  All others are negative.    PHYSICAL EXAMINATION: ECOG PERFORMANCE STATUS: 1 - Symptomatic but completely ambulatory  Vitals:   04/25/18 0915  BP: (!) 141/72  Pulse: 67  Resp: 18  Temp: 97.8 F (36.6 C)  SpO2: 99%   Filed Weights   04/25/18 0915  Weight: 130 lb 9.6 oz (59.2 kg)   .Body mass index is 23.51 kg/m.  GENERAL:alert, in no acute distress and comfortable SKIN: no acute rashes, no significant lesions EYES: conjunctiva are pink and non-injected, sclera anicteric OROPHARYNX: MMM, no exudates, no oropharyngeal erythema or ulceration NECK: supple, no JVD LYMPH:  no palpable lymphadenopathy in the cervical, axillary or inguinal regions LUNGS: clear to auscultation b/l with normal respiratory effort HEART: regular rate & rhythm ABDOMEN:  normoactive bowel sounds , non tender, not  distended. No palpable hepatosplenomegaly.  Extremity: no pedal edema PSYCH: alert & oriented x 3 with fluent speech NEURO: no focal motor/sensory deficits    LABORATORY DATA:  I have reviewed the data as listed  . CBC Latest Ref Rng & Units 04/25/2018 02/28/2018  02/07/2018  WBC 3.9 - 10.3 K/uL 4.7 3.1(L) 4.1  Hemoglobin 11.6 - 15.9 g/dL 14.1 12.6 12.3  Hematocrit 34.8 - 46.6 % 40.3 37.2 36.8  Platelets 145 - 400 K/uL 209 168 158   . CBC    Component Value Date/Time   WBC 4.7 04/25/2018 0856   RBC 4.69 04/25/2018 0856   HGB 14.1 04/25/2018 0856   HGB 12.6 02/28/2018 0816   HGB 12.8 09/10/2017 0801   HCT 40.3 04/25/2018 0856   HCT 39.4 09/10/2017 0801   PLT 209 04/25/2018 0856   PLT 168 02/28/2018 0816   PLT 144 (L) 09/10/2017 0801   MCV 85.9 04/25/2018 0856   MCV 79.0 (L) 09/10/2017 0801   MCH 30.1 04/25/2018 0856   MCHC 35.0 04/25/2018 0856   RDW 14.0 04/25/2018 0856   RDW 15.6 (H) 09/10/2017 0801   LYMPHSABS 1.2 04/25/2018 0856   LYMPHSABS 0.9 09/10/2017 0801   MONOABS 0.5 04/25/2018 0856   MONOABS 0.4 09/10/2017 0801   EOSABS 0.1 04/25/2018 0856   EOSABS 0.1 09/10/2017 0801   BASOSABS 0.0 04/25/2018 0856   BASOSABS 0.0 09/10/2017 0801    . CMP Latest Ref Rng & Units 04/25/2018 02/28/2018 02/07/2018  Glucose 70 - 99 mg/dL 112(H) 102 109  BUN 8 - 23 mg/dL 26(H) 21 20  Creatinine 0.44 - 1.00 mg/dL 0.80 0.77 0.74  Sodium 135 - 145 mmol/L 140 140 140  Potassium 3.5 - 5.1 mmol/L 4.4 3.9 4.0  Chloride 98 - 111 mmol/L 105 109 110(H)  CO2 22 - 32 mmol/L 27 22 24   Calcium 8.9 - 10.3 mg/dL 9.6 9.1 9.4  Total Protein 6.5 - 8.1 g/dL 6.7 6.2(L) 6.3(L)  Total Bilirubin 0.3 - 1.2 mg/dL 0.8 0.9 1.2  Alkaline Phos 38 - 126 U/L 86 67 65  AST 15 - 41 U/L 19 17 18   ALT 0 - 44 U/L 23 17 15    . Lab Results  Component Value Date   LDH 236 (H) 04/25/2018     Component     Latest Ref Rng & Units 12/27/2017 01/01/2018  Color, Urine     YELLOW  YELLOW  Appearance     CLEAR   CLEAR  Specific Gravity, Urine     1.005 - 1.030  1.020  pH     5.0 - 8.0  5.0  Glucose     NEGATIVE mg/dL  NEGATIVE  Hgb urine dipstick     NEGATIVE  NEGATIVE  Bilirubin Urine     NEGATIVE  NEGATIVE  Ketones, ur     NEGATIVE mg/dL  NEGATIVE  Protein     NEGATIVE mg/dL  NEGATIVE  Nitrite     NEGATIVE  NEGATIVE  Leukocytes, UA     NEGATIVE  TRACE (A)  RBC / HPF     0 - 5 RBC/hpf  0-5  WBC, UA     0 - 5 WBC/hpf  0-5  Bacteria, UA     NONE SEEN  NONE SEEN  Squamous Epithelial / LPF     NONE SEEN  0-5 (A)  Mucus       PRESENT  Hyaline Casts, UA       PRESENT  IgG (Immunoglobin G), Serum     700 - 1,600 mg/dL  442 (L)  IgA     87 - 352 mg/dL  69 (L)  IgM (Immunoglobulin M), Srm     26 - 217 mg/dL  20 (L)  Total Protein ELP  6.0 - 8.5 g/dL  6.0  Albumin SerPl Elph-Mcnc     2.9 - 4.4 g/dL  3.7  Alpha 1     0.0 - 0.4 g/dL  0.4  Alpha2 Glob SerPl Elph-Mcnc     0.4 - 1.0 g/dL  0.6  B-Globulin SerPl Elph-Mcnc     0.7 - 1.3 g/dL  0.9  Gamma Glob SerPl Elph-Mcnc     0.4 - 1.8 g/dL  0.4  M Protein SerPl Elph-Mcnc     Not Observed g/dL  Not Observed  Globulin, Total     2.2 - 3.9 g/dL  2.3  Albumin/Glob SerPl     0.7 - 1.7  1.7  IFE 1       Comment  Please Note (HCV):       Comment  QuantiFERON-TB Gold Plus     NEGATIVE NEGATIVE   NIL     IU/mL 0.12   Mitogen-NIL     IU/mL 3.06   TB1-NIL     IU/mL <0.00   TB2-NIL     IU/mL <0.00   Iron     41 - 142 ug/dL 28 (L) 39 (L)  TIBC     236 - 444 ug/dL 244 266  Saturation Ratios     21 - 57 % 11 (L) 15 (L)  UIBC     ug/dL 216 227  Retic Ct Pct     0.7 - 2.1 %  4.1 (H)  RBC.     3.70 - 5.45 MIL/uL  2.96 (L)  Retic Count, Absolute     33.7 - 90.7 K/uL  121.4 (H)  C3 Complement     82 - 167 mg/dL  135  Complement C4, Body Fluid     14 - 44 mg/dL  <2 (L)  DAT, complement       NEG  DAT, IgG       NEG . . .  CMV IgM     AU/mL <30.00   Ferritin     9 - 269 ng/mL 474 (H)   Hemosiderin, Urine      Negative  Negative  Hep B Core Ab, Tot     Negative  Negative  Hepatitis B Surface Ag     Negative  Negative  HCV Ab     0.0 - 0.9 s/co ratio  <0.1  Haptoglobin     34 - 200 mg/dL  <10 (L)  LDH     125 - 245 U/L  591 (H)  Antibody Screen       NEG . . .    12/27/17 BM Bx:   12/27/17 Flow Cytometry:     RADIOGRAPHIC STUDIES: I have personally reviewed the radiological images as listed and agreed with the findings in the report. US Abdomen Limited  Result Date: 04/18/2018 CLINICAL DATA:  Follow-up splenic size.  Lymphoma. EXAM: ULTRASOUND ABDOMEN LIMITED COMPARISON:  PET-CT January 08, 2018 FINDINGS: The spleen measures 12.6 x 13.3 x 5.0 cm with a volume of 432 cc. No focal masses are seen within the spleen. IMPRESSION: Splenomegaly with a length of 13.3 cm and a volume of 432 cc. By report, the spleen measured up to 15.1 cm in maximum dimension on the PET-CT from January 08, 2018 suggesting interval improvement. Electronically Signed   By: Dorise Bullion III M.D   On: 04/18/2018 10:59    ASSESSMENT & PLAN:   62 y.o. female with  1. Pancytopenia (resolving) due to recently diagnosed Splenic marginal zone lymphoma.  Patient appeared to be have significant constitutional symptoms . No evidence of rheumatological or infectious etiology for her fevers and night sweats. Constitutional symptoms now resolved.  2. Splenomegaly - likely due to [place]. PET shows some hypersplenism.improved on [place] abd  3. Anemia - with elevated LDH and low haptoglobin concerning for hemolysis. Coombs neg . Could be IgA driven Coombs neg AIHA. +ve reticulocytosis and Smear with some microspherocytes. No over urine hemosiderinuria. Partly anemia could also be from hypersplenism.  hgb normal now at 14.1  4. Low C4 ? Immune complex disease/autoimmunity related to lymphoma. Per rheumatology - no evidence of primary rheumatological disorder. No overt evidence of endocarditis. C3 - WNL C4 improving from <2 to 7 to 11.  c3 wnl at 108 5. Fevers/chills/night sweat -- likely constitutional symptoms related to ?lymphoma - now resolved. 6. Thrombocytopenia likely from splenomegaly vs lymphoma-- resolved. PLT now WNL at 209k  PLAN:  -Will continue to watch for constitutional symptoms -Recommend eating well and staying hydrated -Recommend continuing B complex and Folic Acid supplement to support accelerated hematopoiesis -Discussed pt labwork today, 04/25/18; blood counts have all normalized, BUN slightly elevated at 26, blood chemistries are otherwise stable -Discussed the 04/18/18 US Abdomen which revealed Splenomegaly with a length of 13.3 cm and a volume of 432 cc. By report, the spleen measured up to 15.1 cm in maximum dimension on the PET-CT from January 08, 2018 suggesting interval improvement. -Very good response to treatment with blood count normalization and splenic response. -Discussed the option to complete maintenance Rituxan and discussed the possible benefits and Ad/E of this -Discussed the option to monitor the pt and track blood counts every 2-3 months and regularly track spleen size -Pt has decided she wishes to pursue maintenance Rituxan    Scheduled to start maintenance Rituxan  RTC with Dr Alessa Mazur in 2 months with labs with next dose of maintenance Rituxan   All of the  patients questions were answered with apparent satisfaction. The patient knows to call the clinic with any problems, questions or concerns.  The total time spent in the appt was 35 minutes and more than 50% was on counseling and direct patient cares.    Verbal consent has been given by the pt for a clinical observer, Mathis Fare, to be present.    Sullivan Lone MD MS AAHIVMS [facility] Hematology/Oncology Physician [facility]  (Office):       [phone] (Work cell):  [phone] (Fax):           [phone]  04/25/2018 10:01 AM  I, Baldwin [place], am acting as a Education administrator for Dr Irene Limbo.   .I have reviewed the  above documentation for accuracy and completeness, and I agree with the above. Brunetta Genera MD

## 2018-04-25 ENCOUNTER — Encounter: Payer: Self-pay | Admitting: Hematology

## 2018-04-25 ENCOUNTER — Inpatient Hospital Stay: Payer: 59 | Attending: Hematology and Oncology | Admitting: Hematology

## 2018-04-25 ENCOUNTER — Inpatient Hospital Stay: Payer: 59

## 2018-04-25 VITALS — BP 141/72 | HR 67 | Temp 97.8°F | Resp 18 | Ht 62.5 in | Wt 130.6 lb

## 2018-04-25 DIAGNOSIS — R509 Fever, unspecified: Secondary | ICD-10-CM | POA: Insufficient documentation

## 2018-04-25 DIAGNOSIS — C8307 Small cell B-cell lymphoma, spleen: Secondary | ICD-10-CM | POA: Diagnosis not present

## 2018-04-25 DIAGNOSIS — Z5112 Encounter for antineoplastic immunotherapy: Secondary | ICD-10-CM | POA: Diagnosis not present

## 2018-04-25 DIAGNOSIS — D731 Hypersplenism: Secondary | ICD-10-CM | POA: Insufficient documentation

## 2018-04-25 DIAGNOSIS — D509 Iron deficiency anemia, unspecified: Secondary | ICD-10-CM

## 2018-04-25 LAB — CBC WITH DIFFERENTIAL/PLATELET
Basophils Absolute: 0 10*3/uL (ref 0.0–0.1)
Basophils Relative: 0 %
Eosinophils Absolute: 0.1 10*3/uL (ref 0.0–0.5)
Eosinophils Relative: 2 %
HCT: 40.3 % (ref 34.8–46.6)
Hemoglobin: 14.1 g/dL (ref 11.6–15.9)
Lymphocytes Relative: 25 %
Lymphs Abs: 1.2 10*3/uL (ref 0.9–3.3)
MCH: 30.1 pg (ref 25.1–34.0)
MCHC: 35 g/dL (ref 31.5–36.0)
MCV: 85.9 fL (ref 79.5–101.0)
Monocytes Absolute: 0.5 10*3/uL (ref 0.1–0.9)
Monocytes Relative: 10 %
Neutro Abs: 2.9 10*3/uL (ref 1.5–6.5)
Neutrophils Relative %: 63 %
Platelets: 209 10*3/uL (ref 145–400)
RBC: 4.69 MIL/uL (ref 3.70–5.45)
RDW: 14 % (ref 11.2–14.5)
WBC: 4.7 10*3/uL (ref 3.9–10.3)

## 2018-04-25 LAB — CMP (CANCER CENTER ONLY)
ALT: 23 U/L (ref 0–44)
AST: 19 U/L (ref 15–41)
Albumin: 4.6 g/dL (ref 3.5–5.0)
Alkaline Phosphatase: 86 U/L (ref 38–126)
Anion gap: 8 (ref 5–15)
BUN: 26 mg/dL — ABNORMAL HIGH (ref 8–23)
CO2: 27 mmol/L (ref 22–32)
Calcium: 9.6 mg/dL (ref 8.9–10.3)
Chloride: 105 mmol/L (ref 98–111)
Creatinine: 0.8 mg/dL (ref 0.44–1.00)
GFR, Est AFR Am: >60 mL/min (ref >60)
GFR, Estimated: >60 mL/min (ref >60)
Glucose, Bld: 112 mg/dL — ABNORMAL HIGH (ref 70–99)
Potassium: 4.4 mmol/L (ref 3.5–5.1)
Sodium: 140 mmol/L (ref 135–145)
Total Bilirubin: 0.8 mg/dL (ref 0.3–1.2)
Total Protein: 6.7 g/dL (ref 6.5–8.1)

## 2018-04-25 LAB — LACTATE DEHYDROGENASE: LDH: 236 U/L — ABNORMAL HIGH (ref 98–192)

## 2018-04-26 LAB — C3 AND C4
C3 Complement: 108 mg/dL (ref 82–167)
Complement C4, Body Fluid: 11 mg/dL — ABNORMAL LOW (ref 14–44)

## 2018-04-28 ENCOUNTER — Telehealth: Payer: Self-pay

## 2018-04-28 NOTE — Telephone Encounter (Signed)
Patient called and wanted to make Dr. Irene Limbo aware that she has decided to start maintenance Rituxan. Dr. Irene Limbo made aware and stated he will place orders. High priority scheduling message sent to add patient to infusion schedule as soon as possible for maintenance Rituxan.

## 2018-04-29 ENCOUNTER — Other Ambulatory Visit: Payer: Self-pay | Admitting: Hematology

## 2018-04-29 ENCOUNTER — Telehealth: Payer: Self-pay | Admitting: Hematology

## 2018-04-29 NOTE — Telephone Encounter (Signed)
Called pt re appts that were added - spoke w/ pt re appts.

## 2018-04-30 ENCOUNTER — Inpatient Hospital Stay: Payer: 59

## 2018-04-30 VITALS — BP 129/63 | HR 93 | Temp 98.2°F | Resp 18

## 2018-04-30 DIAGNOSIS — Z5112 Encounter for antineoplastic immunotherapy: Secondary | ICD-10-CM | POA: Diagnosis not present

## 2018-04-30 DIAGNOSIS — D731 Hypersplenism: Secondary | ICD-10-CM | POA: Diagnosis not present

## 2018-04-30 DIAGNOSIS — Z7189 Other specified counseling: Secondary | ICD-10-CM

## 2018-04-30 DIAGNOSIS — C8307 Small cell B-cell lymphoma, spleen: Secondary | ICD-10-CM | POA: Diagnosis not present

## 2018-04-30 DIAGNOSIS — R509 Fever, unspecified: Secondary | ICD-10-CM | POA: Diagnosis not present

## 2018-04-30 MED ORDER — ACETAMINOPHEN 325 MG PO TABS
ORAL_TABLET | ORAL | Status: AC
Start: 2018-04-30 — End: ?
  Filled 2018-04-30: qty 2

## 2018-04-30 MED ORDER — METHYLPREDNISOLONE SODIUM SUCC 125 MG IJ SOLR
125.0000 mg | Freq: Every day | INTRAMUSCULAR | Status: DC
  Administered 2018-04-30: 125 mg via INTRAVENOUS

## 2018-04-30 MED ORDER — DIPHENHYDRAMINE HCL 25 MG PO CAPS
50.0000 mg | ORAL_CAPSULE | Freq: Once | ORAL | Status: AC
  Administered 2018-04-30: 50 mg via ORAL

## 2018-04-30 MED ORDER — SODIUM CHLORIDE 0.9 % IV SOLN
Freq: Once | INTRAVENOUS | Status: AC
  Administered 2018-04-30: 13:00:00 via INTRAVENOUS
  Filled 2018-04-30: qty 250

## 2018-04-30 MED ORDER — DIPHENHYDRAMINE HCL 25 MG PO CAPS
ORAL_CAPSULE | ORAL | Status: AC
  Filled 2018-04-30: qty 2

## 2018-04-30 MED ORDER — METHYLPREDNISOLONE SODIUM SUCC 125 MG IJ SOLR
INTRAMUSCULAR | Status: AC
  Filled 2018-04-30: qty 2

## 2018-04-30 MED ORDER — FAMOTIDINE 20 MG PO TABS
ORAL_TABLET | ORAL | Status: AC
  Filled 2018-04-30: qty 1

## 2018-04-30 MED ORDER — FAMOTIDINE IN NACL 20-0.9 MG/50ML-% IV SOLN
INTRAVENOUS | Status: AC
  Filled 2018-04-30: qty 50

## 2018-04-30 MED ORDER — SODIUM CHLORIDE 0.9 % IV SOLN
375.0000 mg/m2 | Freq: Once | INTRAVENOUS | Status: AC
  Administered 2018-04-30: 600 mg via INTRAVENOUS
  Filled 2018-04-30: qty 10

## 2018-04-30 MED ORDER — ACETAMINOPHEN 325 MG PO TABS
650.0000 mg | ORAL_TABLET | Freq: Once | ORAL | Status: AC
  Administered 2018-04-30: 650 mg via ORAL

## 2018-04-30 MED ORDER — FAMOTIDINE 20 MG PO TABS
20.0000 mg | ORAL_TABLET | Freq: Once | ORAL | Status: AC
  Administered 2018-04-30: 20 mg via ORAL

## 2018-04-30 NOTE — Patient Instructions (Signed)
[facility] Discharge Instructions for Patients Receiving Chemotherapy  Today you received the following chemotherapy agents:  Rituxan   To help prevent nausea and vomiting after your treatment, we encourage you to take your nausea medication as prescribed.   If you develop nausea and vomiting that is not controlled by your nausea medication, call the clinic.   BELOW ARE SYMPTOMS THAT SHOULD BE REPORTED IMMEDIATELY:  *FEVER GREATER THAN 100.5 F  *CHILLS WITH OR WITHOUT FEVER  NAUSEA AND VOMITING THAT IS NOT CONTROLLED WITH YOUR NAUSEA MEDICATION  *UNUSUAL SHORTNESS OF BREATH  *UNUSUAL BRUISING OR BLEEDING  TENDERNESS IN MOUTH AND THROAT WITH OR WITHOUT PRESENCE OF ULCERS  *URINARY PROBLEMS  *BOWEL PROBLEMS  UNUSUAL RASH Items with * indicate a potential emergency and should be followed up as soon as possible.  Feel free to call the clinic should you have any questions or concerns. The clinic phone number is [phone].  Please show the CHEMO ALERT CARD at check-in to the Emergency Department and triage nurse.   

## 2018-05-05 ENCOUNTER — Encounter: Payer: Self-pay | Admitting: Hematology

## 2018-05-07 ENCOUNTER — Telehealth: Payer: Self-pay

## 2018-05-07 NOTE — Telephone Encounter (Signed)
Patient called requesting information about follow-up appointments. Scheduling message sent to schedule patient the week of September 23rd for lab, MD visit, and maintenance Rituxan. Patient aware to expect a call from scheduling.

## 2018-05-08 ENCOUNTER — Telehealth: Payer: Self-pay

## 2018-05-08 NOTE — Telephone Encounter (Signed)
Spoke with patient concerning upcoming appointment. She was aware due to MyChart. per 7/31 sch msg

## 2018-05-16 ENCOUNTER — Telehealth: Payer: Self-pay

## 2018-05-16 NOTE — Telephone Encounter (Signed)
Return to work clearance faxed to [employer] at [phone]. Confirmation of fax received.

## 2018-06-12 ENCOUNTER — Telehealth: Payer: Self-pay

## 2018-06-12 ENCOUNTER — Encounter: Payer: Self-pay | Admitting: Hematology

## 2018-06-12 NOTE — Telephone Encounter (Signed)
Pt sent MyChart message asking if she is able to take the flu vaccine while receiving rituxan. Per Dr. Irene Limbo, ok for the pt to have the flu vaccine but not the flu mist. Response sent to pt.

## 2018-06-17 DIAGNOSIS — Z1231 Encounter for screening mammogram for malignant neoplasm of breast: Secondary | ICD-10-CM | POA: Diagnosis not present

## 2018-06-17 MED ORDER — DEXAMETHASONE SODIUM PHOSPHATE 10 MG/ML IJ SOLN
INTRAMUSCULAR | Status: AC
  Filled 2018-06-17: qty 1

## 2018-06-17 MED ORDER — LIDOCAINE 2% (20 MG/ML) 5 ML SYRINGE
INTRAMUSCULAR | Status: AC
  Filled 2018-06-17: qty 5

## 2018-06-17 MED ORDER — FENTANYL CITRATE (PF) 100 MCG/2ML IJ SOLN
INTRAMUSCULAR | Status: AC
  Filled 2018-06-17: qty 2

## 2018-06-17 MED ORDER — ONDANSETRON HCL 4 MG/2ML IJ SOLN
INTRAMUSCULAR | Status: AC
  Filled 2018-06-17: qty 2

## 2018-06-17 MED ORDER — PROPOFOL 10 MG/ML IV BOLUS
INTRAVENOUS | Status: AC
  Filled 2018-06-17: qty 20

## 2018-06-17 MED ORDER — KETOROLAC TROMETHAMINE 30 MG/ML IJ SOLN
INTRAMUSCULAR | Status: AC
  Filled 2018-06-17: qty 1

## 2018-06-17 MED ORDER — MIDAZOLAM HCL 2 MG/2ML IJ SOLN
INTRAMUSCULAR | Status: AC
  Filled 2018-06-17: qty 2

## 2018-06-19 DIAGNOSIS — Z13820 Encounter for screening for osteoporosis: Secondary | ICD-10-CM | POA: Diagnosis not present

## 2018-06-19 DIAGNOSIS — Z23 Encounter for immunization: Secondary | ICD-10-CM | POA: Diagnosis not present

## 2018-06-19 DIAGNOSIS — R7301 Impaired fasting glucose: Secondary | ICD-10-CM | POA: Diagnosis not present

## 2018-06-19 DIAGNOSIS — E786 Lipoprotein deficiency: Secondary | ICD-10-CM | POA: Diagnosis not present

## 2018-06-19 DIAGNOSIS — I10 Essential (primary) hypertension: Secondary | ICD-10-CM | POA: Diagnosis not present

## 2018-06-19 DIAGNOSIS — Z8579 Personal history of other malignant neoplasms of lymphoid, hematopoietic and related tissues: Secondary | ICD-10-CM | POA: Diagnosis not present

## 2018-06-26 MED FILL — valACYclovir HCL 1 GM TABS: 1 | 15 days supply | Qty: 30 | Fill #0

## 2018-06-26 MED FILL — CELECOXIB 200 MG CAPSULE: 200 | 30 days supply | Qty: 30 | Fill #0

## 2018-07-02 NOTE — Progress Notes (Signed)
HEMATOLOGY/ONCOLOGY CLINIC NOTE  Date of Service: 07/03/18    Patient Care Team: Hulan Fess, MD as PCP - General (Family Medicine)  CHIEF COMPLAINTS/PURPOSE OF CONSULTATION:  F/u for mx of SMZL  HISTORY OF PRESENTING ILLNESS:   Emily Foley is a wonderful 62 y.o. 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 She is accompanied today by three members of her family. The pt reports that she is doing well overall.   The pt notes that on March 6 she developed a low-grade fever and continues to have a fever. She has kept her fever at [place] with Tylenol every 6 hours. She notes that today she has felt the least feverish since March 6.  She saw her PCP Dr. Hulan Fess who ruled out influenza and UTI. She has also seen rheumatology over the last couple weeks and had a rheumatological workup that did not show overt evidence of a primary autoimmune condition. She was noted to be neg for ANA, RF and CCP Ab. Interesting was noted to have low C4 levels.  She notes that her resting HR has been elevated and describes her heart rate as tachycardic. She had an ECHO on 12/26/17 which showed a normal EF, and was then referred to see infectious disease: CMV and TB Quantiferon were negative.  She notes that the ID team told her there was no clear evidence of an infectious process. TTE no vegetation. BL Bcx neg.  She notes that last week she developed a cough, but believes it to be allergy related. She notes that she has had an enlarged spleen since at least October 2018 revealed by a 07/31/17 US Abdomen. She notes some abdominal discomfort, and has felt that she gets full quickly after eating.  In the last year she intentionally dropped about 20 lbs of weight with change in her diet and avoiding most carbs. She notes that in the last 3 weeks, since she began feeling ill this month, she has lost about 6-7 lbs.     She also notes that she has been having night sweats that have returned this month after a few months of night sweats last year which abated in August 2018. She notes no other signs of recent infection, and denies any travel outside of the country. She denies any concern of insect bites.   She notes that in [place] she was on iron pills for low Hgb, which increased her Hgb. She stopped taking after her Hgb normalized.  She notes that in March 2018 she had a bad stomach bug and in December 2017 she had influenza.  She notes that she has been under a lot of stress with the deaths of her mother and step-father in a couple weeks of one another.   The pt reports that she has seen rheumatology twice for her hand, knee and hip pain but was confirmed to have osteoarthritis as opposed to rheumatoid arthritis. She notes that the severity of her joint pains mirror the content of her diet. She notes that her joint have never been red or swollen.   She notes that she will be having an iron infusion on 01/03/18.  Of note prior to the patient's visit today, pt has had BM Bx completed on 12/27/17 with results revealing HYPERCELLULAR BONE MARROW FOR AGE WITH NUMEROUS ATYPICAL LYMPHOID AGGREGATES. - TRILINEAGE HEMATOPOIESIS. On 12/27/17 the patient's flow cytometry revealed A MINOR  MONOCLONAL B-CELL POPULATION IDENTIFIED.  Flow cytometric analysis shows a minor population of monoclonal B cells (17% of all lymphocytes) expressing pan B-cell antigens including CD20 with lack of CD5, CD10, CD25 or CD103 expression. Immunohistochemical stains were also performed and show that the lymphoid aggregates show a mixture of T and B cells with slight predominance of T cells. No significant cyclin D1, CD34, or TdT positivity is identified. The overall findings are limited but slightly favor involvement by a B-cell lymphoproliferative process. Consideration was given to marginal zone lymphoma and splenic lymphoma  especially in the presence of splenomegaly. Nonetheless, additional material (ie lymph node or tissue biopsy) is strongly recommended for further evaluation.  I discussed the BM Bx results with Dr Gari Crown -- overall BM Bx + clinical picture certainly concerning for SMZL with possible coombs neg AIHA and possible autoimmune phenomenon related to Emily Foley?Emily Foley  Most recent lab results (12/30/17) of CBC  is as follows: all values are WNL except for WBC at 3.5k, RBC at 3.18, Hgb at 7.6, HCT at 24.4, MCV at 76.7, MCH at 23.9, MCHC at 31.1, RDW at 17.0. LDH 12/30/17 was at 616. Haptoglobin<10 -- suggestive of hemolysis. C-reactive protein 09/10/17 was elevated at 20.5.   On review of systems, pt reports low-grade fever, night sweats, some fatigue, tachycardia, occasional lightheadedness, and denies dizziness, weakness, noticing any enlarged lymph nodes, changes in bowel habits, abdominal pains, abnormal vaginal discharge, and any other symptoms.   On PMHx the pt reports high blood pressure and degenerative arthritis. On Family Hx the pt reports her father had rheumatoid arthritis, her mother had kidney disease.  Interval History:  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 The patient's last visit with [place] was on 04/28/18. She is accompanied today by her sister. The pt reports that se is doing well overall.   The pt reports that she has had stable energy levels over the interim. She adds that she has no concerns for infections. She denies developing any constitutional symptoms whatsoever. The pt notes that she has not had any abdominal pains, but may endorse some occasional abdominal discomfort. She has continued working and enjoying life in general.   Lab results today (07/03/18) of CBC w/diff, CMP, and Reticulocytes is as follows: all values are WNL except for Glucose at 106,, Retic ct abs at 91.4k. 07/03/18 LDH is at 195  On  review of systems, pt reports stable energy levels, and denies fevers, chills, night sweats, unexpected weight loss, abdominal pains, concerns for infections, swelling or discomfort in hands, and any other symptoms.    MEDICAL HISTORY:  Past Medical History:  Diagnosis Date  . Arthritis   . Hypertension     SURGICAL HISTORY: History reviewed. No pertinent surgical history.  SOCIAL HISTORY: Social History   Socioeconomic History  . Marital status: Single    Spouse name: Not on file  . Number of children: Not on file  . Years of education: Not on file  . Highest education level: Not on file  Occupational History  . Not on file  Social Needs  . Financial resource strain: Not on file  . Food insecurity:    Worry: Not on file    Inability: Not on file  . Transportation needs:    Medical: Not on file    Non-medical: Not on file  Tobacco Use  . Smoking status: Never Smoker  . Smokeless tobacco: Never Used  Substance and  Sexual Activity  . Alcohol use: Not Currently    Alcohol/week: 0.0 standard drinks  . Drug use: Never  . Sexual activity: Not on file  Lifestyle  . Physical activity:    Days per week: Not on file    Minutes per session: Not on file  . Stress: Not on file  Relationships  . Social connections:    Talks on phone: Not on file    Gets together: Not on file    Attends religious service: Not on file    Active member of club or organization: Not on file    Attends meetings of clubs or organizations: Not on file    Relationship status: Not on file  . Intimate partner violence:    Fear of current or ex partner: Not on file    Emotionally abused: Not on file    Physically abused: Not on file    Forced sexual activity: Not on file  Other Topics Concern  . Not on file  Social History Narrative  . Not on file    FAMILY HISTORY: Family History  Problem Relation Age of Onset  . Diabetes Mother   . Hyperlipidemia Mother   . Hypertension Mother      ALLERGIES:  is allergic to biaxin [clarithromycin].  MEDICATIONS:  Current Outpatient Medications  Medication Sig Dispense Refill  . folic acid (FOLVITE) 025 MCG tablet Take 800 mcg by mouth daily.     . vitamin B-12 (CYANOCOBALAMIN) 1000 MCG tablet Take 1,000 mcg by mouth daily.     No current facility-administered medications for this visit.     REVIEW OF SYSTEMS:    A 10+ POINT REVIEW OF SYSTEMS WAS OBTAINED including neurology, dermatology, psychiatry, cardiac, respiratory, lymph, extremities, GI, GU, Musculoskeletal, constitutional, breasts, reproductive, HEENT.  All pertinent positives are noted in the HPI.  All others are negative.    PHYSICAL EXAMINATION: ECOG PERFORMANCE STATUS: 1 - Symptomatic but completely ambulatory  Vitals:   07/03/18 0859  BP: (!) 144/83  Pulse: 74  Resp: 18  Temp: 98.3 F (36.8 C)  SpO2: 99%   Filed Weights   07/03/18 0859  Weight: 129 lb 1.6 oz (58.6 kg)   .Body mass index is 23.24 kg/m.  GENERAL:alert, in no acute distress and comfortable SKIN: no acute rashes, no significant lesions EYES: conjunctiva are pink and non-injected, sclera anicteric OROPHARYNX: MMM, no exudates, no oropharyngeal erythema or ulceration NECK: supple, no JVD LYMPH:  no palpable lymphadenopathy in the cervical, axillary or inguinal regions LUNGS: clear to auscultation b/l with normal respiratory effort HEART: regular rate & rhythm ABDOMEN:  normoactive bowel sounds , non tender, not distended. No palpable hepatosplenomegaly.  Extremity: no pedal edema PSYCH: alert & oriented x 3 with fluent speech NEURO: no focal motor/sensory deficits   LABORATORY DATA:  I have reviewed the data as listed  . CBC Latest Ref Rng & Units 07/03/2018 04/25/2018 02/28/2018  WBC 3.9 - 10.3 K/uL 4.4 4.7 3.1(L)  Hemoglobin 11.6 - 15.9 g/dL 14.5 14.1 12.6  Hematocrit 34.8 - 46.6 % 41.5 40.3 37.2  Platelets 145 - 400 K/uL 183 209 168   . CBC    Component Value Date/Time    WBC 4.4 07/03/2018 0835   WBC 4.7 04/25/2018 0856   RBC 4.81 07/03/2018 0835   RBC 4.81 07/03/2018 0835   HGB 14.5 07/03/2018 0835   HGB 12.8 09/10/2017 0801   HCT 41.5 07/03/2018 0835   HCT 39.4 09/10/2017 0801   PLT 183 07/03/2018  0835   PLT 144 (L) 09/10/2017 0801   MCV 86.3 07/03/2018 0835   MCV 79.0 (L) 09/10/2017 0801   MCH 30.1 07/03/2018 0835   MCHC 34.9 07/03/2018 0835   RDW 13.2 07/03/2018 0835   RDW 15.6 (H) 09/10/2017 0801   LYMPHSABS 1.2 07/03/2018 0835   LYMPHSABS 0.9 09/10/2017 0801   MONOABS 0.4 07/03/2018 0835   MONOABS 0.4 09/10/2017 0801   EOSABS 0.1 07/03/2018 0835   EOSABS 0.1 09/10/2017 0801   BASOSABS 0.0 07/03/2018 0835   BASOSABS 0.0 09/10/2017 0801    . CMP Latest Ref Rng & Units 07/03/2018 04/25/2018 02/28/2018  Glucose 70 - 99 mg/dL 106(H) 112(H) 102  BUN 8 - 23 mg/dL 17 26(H) 21  Creatinine 0.44 - 1.00 mg/dL 0.75 0.80 0.77  Sodium 135 - 145 mmol/L 143 140 140  Potassium 3.5 - 5.1 mmol/L 4.2 4.4 3.9  Chloride 98 - 111 mmol/L 108 105 109  CO2 22 - 32 mmol/L 26 27 22   Calcium 8.9 - 10.3 mg/dL 9.6 9.6 9.1  Total Protein 6.5 - 8.1 g/dL 6.5 6.7 6.2(L)  Total Bilirubin 0.3 - 1.2 mg/dL 1.0 0.8 0.9  Alkaline Phos 38 - 126 U/L 67 86 67  AST 15 - 41 U/L 16 19 17   ALT 0 - 44 U/L 20 23 17    . Lab Results  Component Value Date   LDH 195 (H) 07/03/2018     12/27/17 BM Bx:   12/27/17 Flow Cytometry:     RADIOGRAPHIC STUDIES: I have personally reviewed the radiological images as listed and agreed with the findings in the report. No results found.  ASSESSMENT & PLAN:   62 y.o. female with  1. S/p ancytopenia (resolving) due to recently diagnosed Splenic marginal zone lymphoma. Patient appeared to be have significant constitutional symptoms . No evidence of rheumatological or infectious etiology for her fevers and night sweats. Constitutional symptoms now resolved.  2. Splenomegaly - likely due to Sorrel. Emily Kitchenimproved on [place] abd. Clinical no  palpable splenomegaly today.   04/18/18 US Abdomen revealed Splenomegaly with a length of 13.3 cm and a volume of 432 cc. By report, the spleen measured up to 15.1 cm in maximum dimension on the PET-CT from January 08, 2018 suggesting interval improvement.   3. Anemia - with elevated LDH and low haptoglobin concerning for hemolysis. Coombs neg . Could be IgA driven Coombs neg AIHA. +ve reticulocytosis and Smear with some microspherocytes. No over urine hemosiderinuria. Partly anemia could also be from hypersplenism.  hgb normal now at 14.1  4. Low C4 ? Immune complex disease/autoimmunity related to lymphoma. Per rheumatology - no evidence of primary rheumatological disorder. No overt evidence of endocarditis. C3 - WNL C4 improving from <2 to 7 to 11. c3 wnl at 108 5. Fevers/chills/night sweat -- likely constitutional symptoms related to ?lymphoma - now resolved. 6. Thrombocytopenia likely from splenomegaly vs lymphoma-- resolved. PLT now WNL at 183k  PLAN:  -Will continue to watch for constitutional symptoms -Recommend continuing B complex and Folic Acid supplement to support accelerated hematopoiesis -Very good response to treatment with blood count normalization and splenic response. -Discussed pt labwork today, 07/03/18; blood counts and chemistries are normal. -The pt shows no clinical or lab progression of SMZL at this time.  -The pt has no prohibitive toxicities from continuing maintenance Rituxan at this time.     -Advised that pt receive her annual flu shot -Will see the pt back in 2 months    Cancel appointments in  the system with me and labs on 07/25/2018 Please schedule next dose of maintenance Rituxan in 60 days with labs and MD visit (patient prefers week after thanksgiving).    All of the  patients questions were answered with apparent satisfaction. The patient knows to call the clinic with any problems, questions or concerns.  The total time spent in the appt was 25 minutes  and more than 50% was on counseling and direct patient cares.      Sullivan Lone MD MS AAHIVMS [facility] Hematology/Oncology Physician [facility]  (Office):       [phone] (Work cell):  [phone] (Fax):           [phone]  07/03/2018 10:14 AM  I, Baldwin [place], am acting as a scribe for Dr. Irene Limbo  .I have reviewed the above documentation for accuracy and completeness, and I agree with the above. Brunetta Genera MD

## 2018-07-03 ENCOUNTER — Inpatient Hospital Stay (HOSPITAL_BASED_OUTPATIENT_CLINIC_OR_DEPARTMENT_OTHER): Payer: 59 | Admitting: Hematology

## 2018-07-03 ENCOUNTER — Encounter: Payer: Self-pay | Admitting: Hematology

## 2018-07-03 ENCOUNTER — Inpatient Hospital Stay: Payer: 59

## 2018-07-03 ENCOUNTER — Inpatient Hospital Stay: Payer: 59 | Attending: Hematology and Oncology

## 2018-07-03 VITALS — BP 138/72 | HR 71 | Temp 98.5°F | Resp 16

## 2018-07-03 VITALS — BP 144/83 | HR 74 | Temp 98.3°F | Resp 18 | Ht 62.5 in | Wt 129.1 lb

## 2018-07-03 DIAGNOSIS — D61818 Other pancytopenia: Secondary | ICD-10-CM | POA: Diagnosis not present

## 2018-07-03 DIAGNOSIS — C8307 Small cell B-cell lymphoma, spleen: Secondary | ICD-10-CM | POA: Insufficient documentation

## 2018-07-03 DIAGNOSIS — Z7189 Other specified counseling: Secondary | ICD-10-CM

## 2018-07-03 DIAGNOSIS — C8580 Other specified types of non-Hodgkin lymphoma, unspecified site: Secondary | ICD-10-CM

## 2018-07-03 DIAGNOSIS — Z5112 Encounter for antineoplastic immunotherapy: Secondary | ICD-10-CM

## 2018-07-03 LAB — RETICULOCYTES
RBC.: 4.81 MIL/uL (ref 3.70–5.45)
Retic Count, Absolute: 91.4 10*3/uL — ABNORMAL HIGH (ref 33.7–90.7)
Retic Ct Pct: 1.9 % (ref 0.7–2.1)

## 2018-07-03 LAB — CBC WITH DIFFERENTIAL (CANCER CENTER ONLY)
Basophils Absolute: 0 10*3/uL (ref 0.0–0.1)
Basophils Relative: 1 %
Eosinophils Absolute: 0.1 10*3/uL (ref 0.0–0.5)
Eosinophils Relative: 2 %
HCT: 41.5 % (ref 34.8–46.6)
Hemoglobin: 14.5 g/dL (ref 11.6–15.9)
Lymphocytes Relative: 28 %
Lymphs Abs: 1.2 10*3/uL (ref 0.9–3.3)
MCH: 30.1 pg (ref 25.1–34.0)
MCHC: 34.9 g/dL (ref 31.5–36.0)
MCV: 86.3 fL (ref 79.5–101.0)
Monocytes Absolute: 0.4 10*3/uL (ref 0.1–0.9)
Monocytes Relative: 9 %
Neutro Abs: 2.7 10*3/uL (ref 1.5–6.5)
Neutrophils Relative %: 60 %
Platelet Count: 183 10*3/uL (ref 145–400)
RBC: 4.81 MIL/uL (ref 3.70–5.45)
RDW: 13.2 % (ref 11.2–14.5)
WBC Count: 4.4 10*3/uL (ref 3.9–10.3)

## 2018-07-03 LAB — CMP (CANCER CENTER ONLY)
ALT: 20 U/L (ref 0–44)
AST: 16 U/L (ref 15–41)
Albumin: 4.5 g/dL (ref 3.5–5.0)
Alkaline Phosphatase: 67 U/L (ref 38–126)
Anion gap: 9 (ref 5–15)
BUN: 17 mg/dL (ref 8–23)
CO2: 26 mmol/L (ref 22–32)
Calcium: 9.6 mg/dL (ref 8.9–10.3)
Chloride: 108 mmol/L (ref 98–111)
Creatinine: 0.75 mg/dL (ref 0.44–1.00)
GFR, Est AFR Am: >60 mL/min (ref >60)
GFR, Estimated: >60 mL/min (ref >60)
Glucose, Bld: 106 mg/dL — ABNORMAL HIGH (ref 70–99)
Potassium: 4.2 mmol/L (ref 3.5–5.1)
Sodium: 143 mmol/L (ref 135–145)
Total Bilirubin: 1 mg/dL (ref 0.3–1.2)
Total Protein: 6.5 g/dL (ref 6.5–8.1)

## 2018-07-03 LAB — LACTATE DEHYDROGENASE: LDH: 195 U/L — ABNORMAL HIGH (ref 98–192)

## 2018-07-03 MED ORDER — SODIUM CHLORIDE 0.9 % IV SOLN
375.0000 mg/m2 | Freq: Once | INTRAVENOUS | Status: AC
  Administered 2018-07-03: 600 mg via INTRAVENOUS
  Filled 2018-07-03: qty 50

## 2018-07-03 MED ORDER — DIPHENHYDRAMINE HCL 25 MG PO CAPS
ORAL_CAPSULE | ORAL | Status: AC
  Filled 2018-07-03: qty 2

## 2018-07-03 MED ORDER — ACETAMINOPHEN 325 MG PO TABS
650.0000 mg | ORAL_TABLET | Freq: Once | ORAL | Status: AC
  Administered 2018-07-03: 650 mg via ORAL

## 2018-07-03 MED ORDER — METHYLPREDNISOLONE SODIUM SUCC 125 MG IJ SOLR
125.0000 mg | Freq: Every day | INTRAMUSCULAR | Status: DC
  Administered 2018-07-03: 125 mg via INTRAVENOUS

## 2018-07-03 MED ORDER — FAMOTIDINE 20 MG PO TABS
20.0000 mg | ORAL_TABLET | Freq: Once | ORAL | Status: AC
  Administered 2018-07-03: 20 mg via ORAL

## 2018-07-03 MED ORDER — DIPHENHYDRAMINE HCL 25 MG PO CAPS
50.0000 mg | ORAL_CAPSULE | Freq: Once | ORAL | Status: AC
  Administered 2018-07-03: 50 mg via ORAL

## 2018-07-03 MED ORDER — FAMOTIDINE 20 MG PO TABS
ORAL_TABLET | ORAL | Status: AC
  Filled 2018-07-03: qty 1

## 2018-07-03 MED ORDER — ACETAMINOPHEN 325 MG PO TABS
ORAL_TABLET | ORAL | Status: AC
  Filled 2018-07-03: qty 2

## 2018-07-03 MED ORDER — METHYLPREDNISOLONE SODIUM SUCC 125 MG IJ SOLR
INTRAMUSCULAR | Status: AC
  Filled 2018-07-03: qty 2

## 2018-07-03 MED ORDER — SODIUM CHLORIDE 0.9 % IV SOLN
Freq: Once | INTRAVENOUS | Status: AC
  Administered 2018-07-03: 11:00:00 via INTRAVENOUS
  Filled 2018-07-03: qty 250

## 2018-07-16 DIAGNOSIS — L259 Unspecified contact dermatitis, unspecified cause: Secondary | ICD-10-CM | POA: Diagnosis not present

## 2018-07-24 DIAGNOSIS — E2839 Other primary ovarian failure: Secondary | ICD-10-CM | POA: Diagnosis not present

## 2018-07-25 ENCOUNTER — Ambulatory Visit: Payer: 59 | Admitting: Hematology

## 2018-07-25 ENCOUNTER — Other Ambulatory Visit: Payer: 59

## 2018-09-03 IMAGING — CT CT ABD-PELV W/ CM
2 of 5 series · 16 of 46 positions shown, 18 images · IV contrast (APPLIED)
Comparison: 06/29/2010

CLINICAL DATA: Periumbilical pain for several weeks, initial
encounter

EXAM:
CT ABDOMEN AND PELVIS WITH CONTRAST
TECHNIQUE: Multidetector CT imaging of the abdomen and pelvis was performed
using the standard protocol following bolus administration of
intravenous contrast.
CONTRAST:  100mL KXJJFR-7HH IOPAMIDOL (KXJJFR-7HH) INJECTION 61%

[Series 2: axial st · axial · 0.67mm/px · z∈[-411,-31]mm · 13 of 90 slices shown, 15 images]
[im 7/90  soft-tissue]
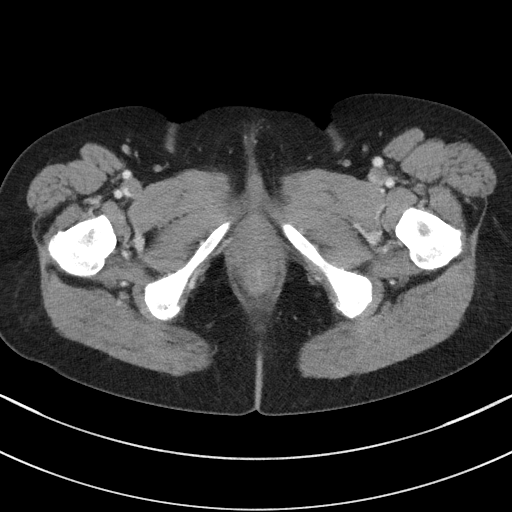
[im 7/90  bone]
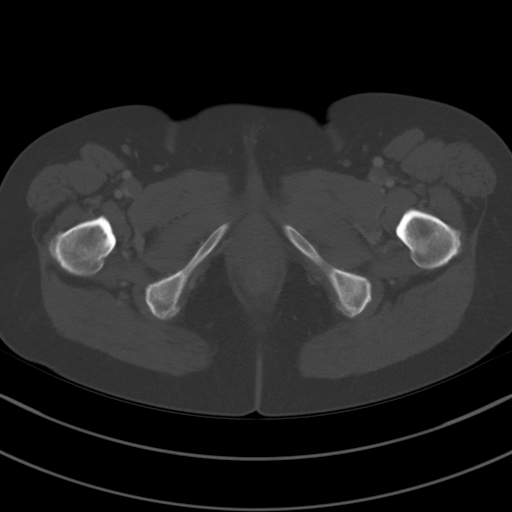
[im 13/90  soft-tissue]
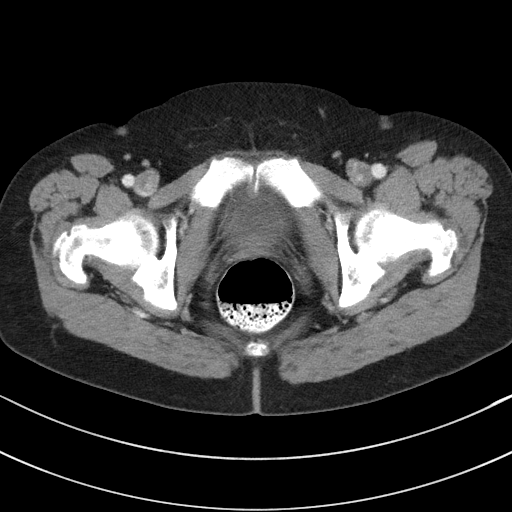
[im 20/90  soft-tissue]
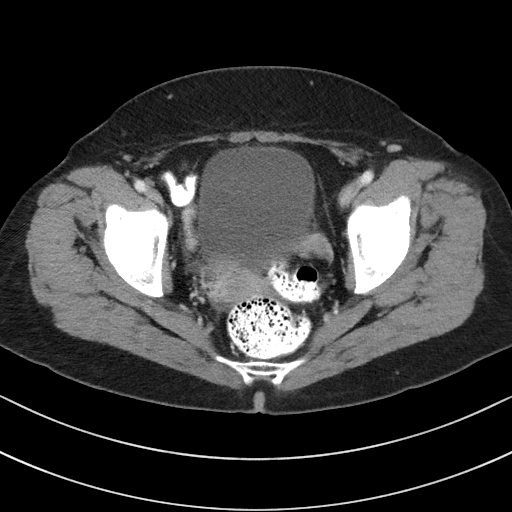
[im 26/90  soft-tissue]
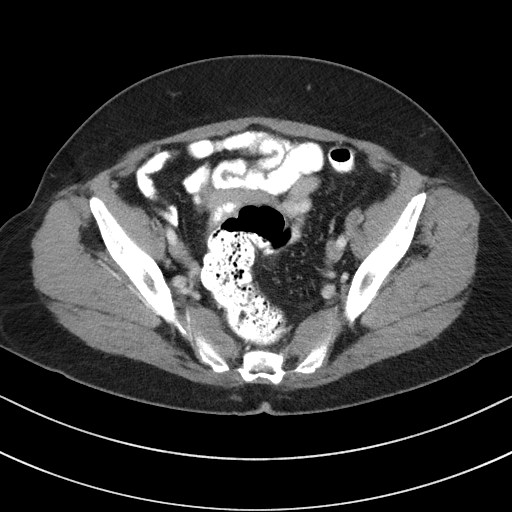
[im 32/90  soft-tissue]
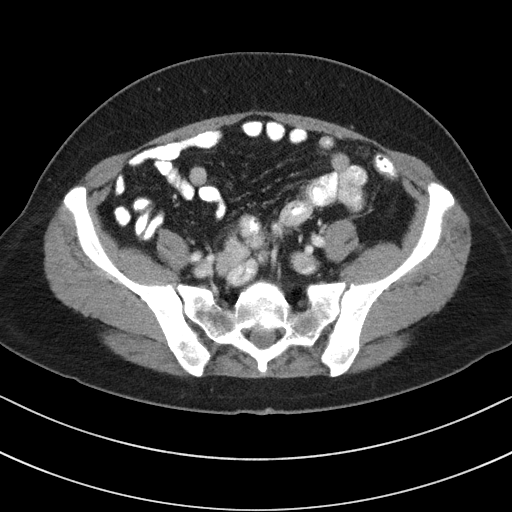
[im 39/90  soft-tissue]
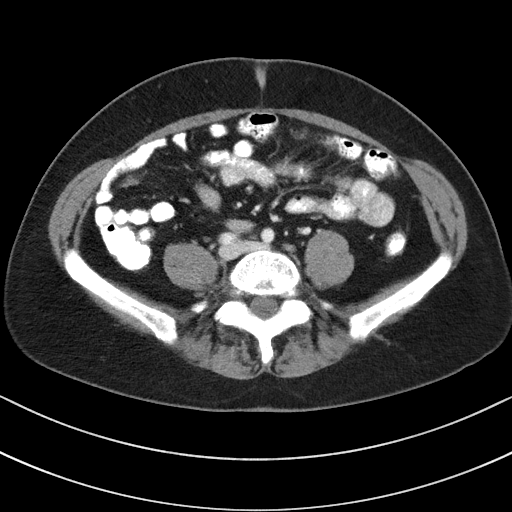
[im 45/90  soft-tissue]
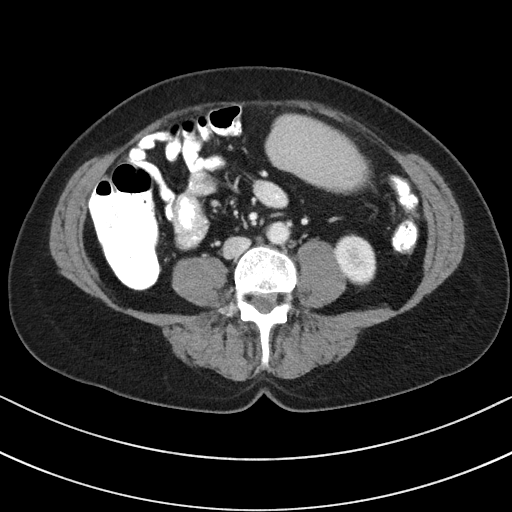
[im 51/90  soft-tissue]
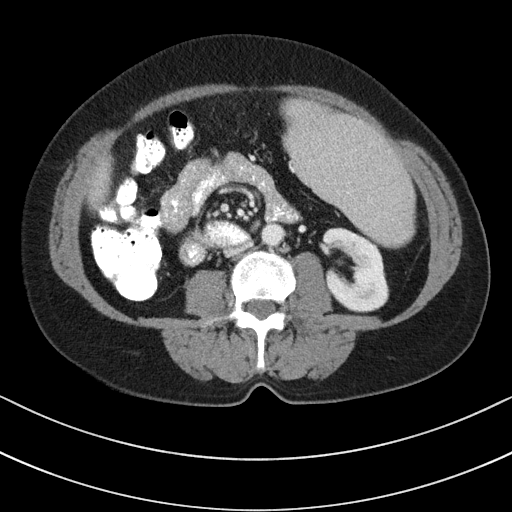
[im 58/90  soft-tissue]
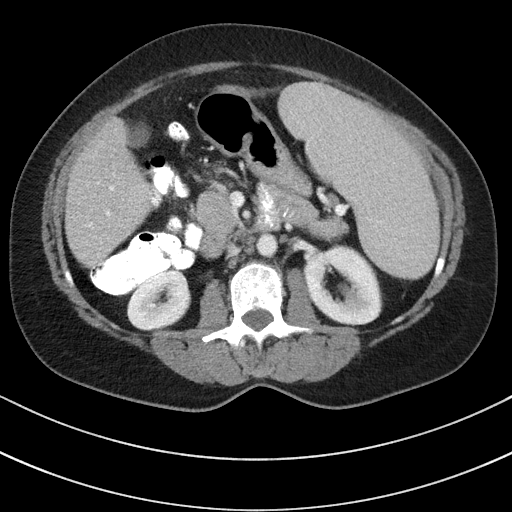
[im 58/90  bone]
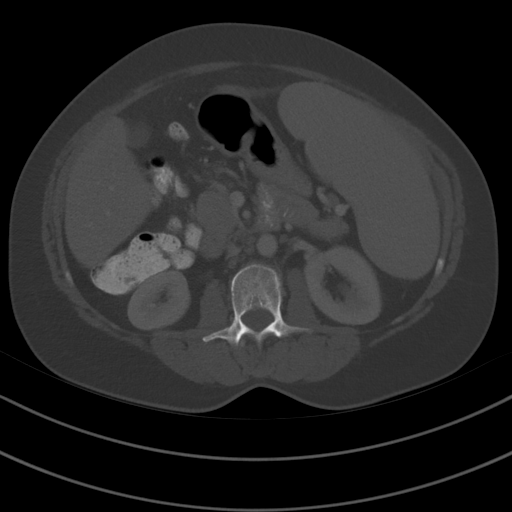
[im 64/90  soft-tissue]
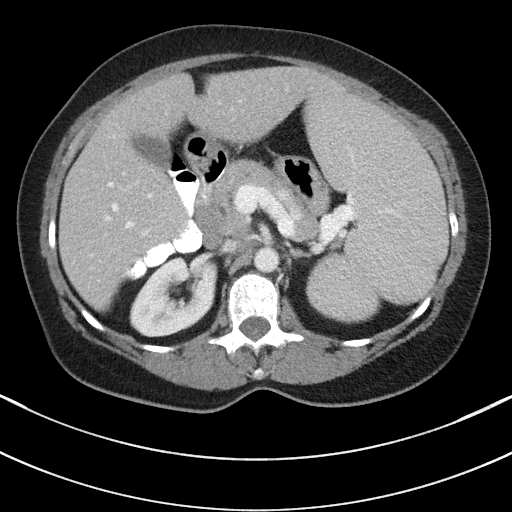
[im 70/90  soft-tissue]
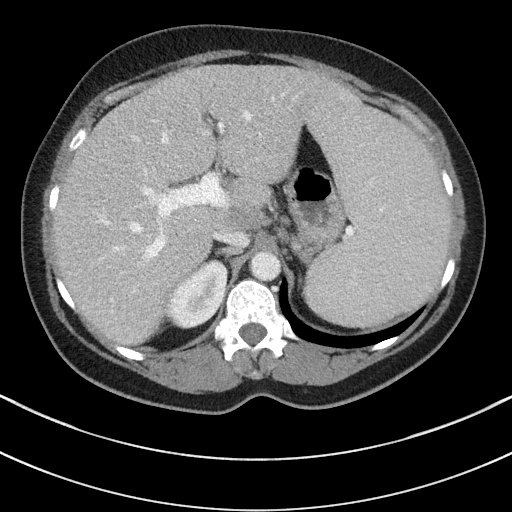
[im 77/90  soft-tissue]
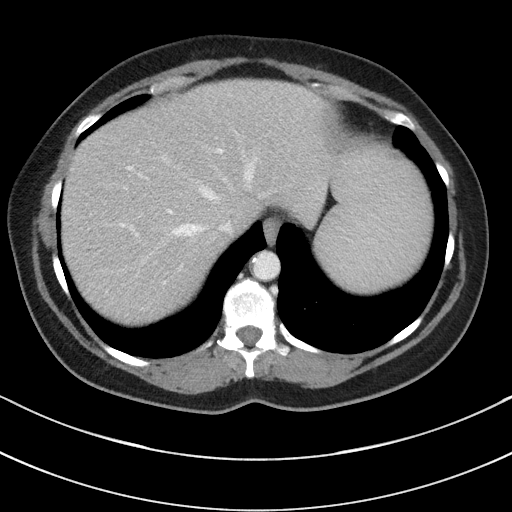
[im 83/90  soft-tissue]
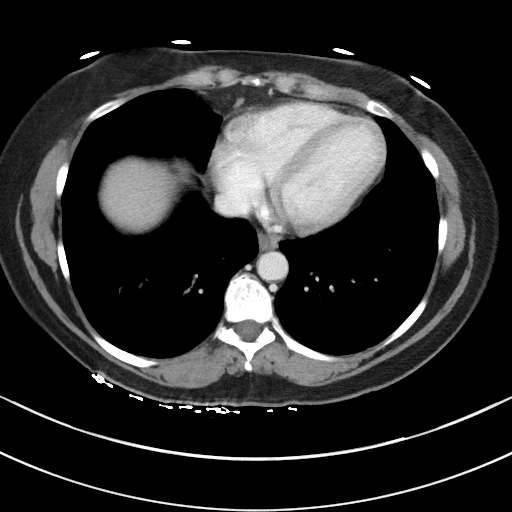

[Series 5: coronal st · coronal · 0.69mm/px · 3 of 87 slices shown]
[im 29/87  soft-tissue]
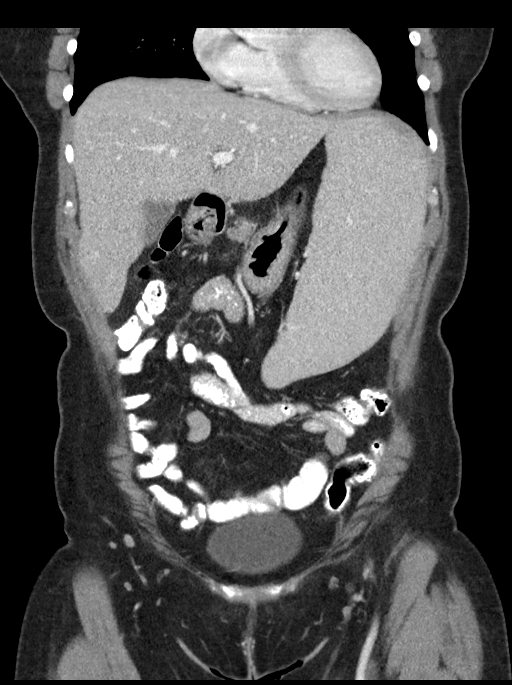
[im 39/87  soft-tissue]
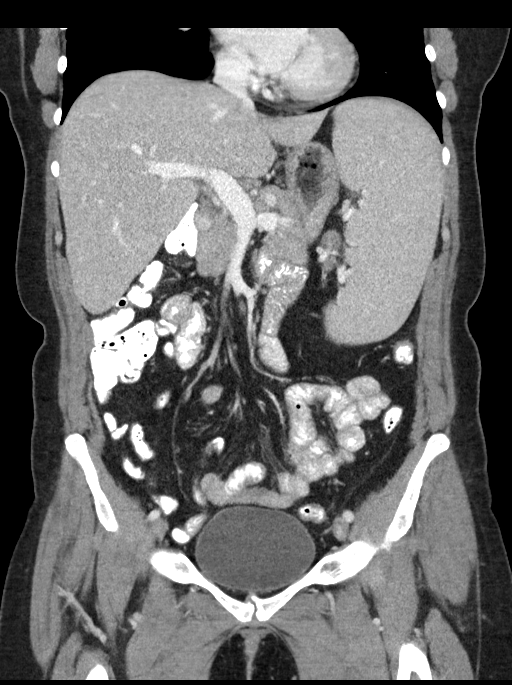
[im 48/87  soft-tissue]
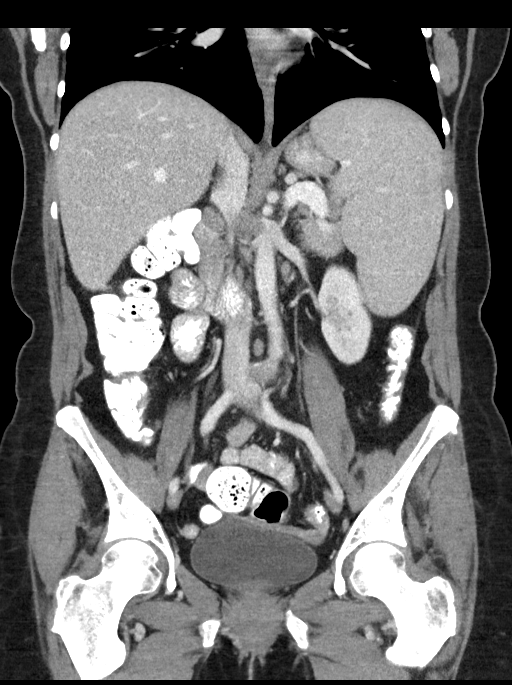

[16 of 46 positions shown; findings below may reference images not displayed]

FINDINGS: Lower chest: No acute abnormality.

Hepatobiliary: No focal liver abnormality is seen. No gallstones,
gallbladder wall thickening, or biliary dilatation.

Pancreas: Unremarkable. No pancreatic ductal dilatation or
surrounding inflammatory changes.

Spleen: Spleen is enlarged in size measuring approximately 16 cm in
greatest AP dimension.

Adrenals/Urinary Tract: Adrenal glands are unremarkable. Kidneys are
normal, without renal calculi, focal lesion, or hydronephrosis.
Bladder is well distended with a tiny focus of air within. This may
be related to recent instrumentation. Clinical correlation is
recommended.

Stomach/Bowel: The appendix is not well visualized. No inflammatory
changes to suggest appendicitis are seen.

Vascular/Lymphatic: No significant vascular findings are present. No
enlarged abdominal or pelvic lymph nodes.

Reproductive: Uterus and bilateral adnexa are unremarkable.

Other: No abdominal wall hernia or abnormality. No abdominopelvic
ascites.

Musculoskeletal: No acute or significant osseous findings.
IMPRESSION: Splenomegaly without definitive mass. This is new from the prior
exam and may be related to the underlying history of
thrombocytopenia. Further workup may be helpful.

No other focal abnormality is noted.

## 2018-09-10 NOTE — Progress Notes (Signed)
HEMATOLOGY/ONCOLOGY CLINIC NOTE  Date of Service: 09/11/18    Patient Care Team: Hulan Fess, MD as PCP - General (Family Medicine)  CHIEF COMPLAINTS/PURPOSE OF CONSULTATION:  F/u for mx of SMZL  HISTORY OF PRESENTING ILLNESS:   Emily Foley is a wonderful 62 y.o. 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 She is accompanied today by three members of her family. The pt reports that she is doing well overall.   The pt notes that on March 6 she developed a low-grade fever and continues to have a fever. She has kept her fever at [place] with Tylenol every 6 hours. She notes that today she has felt the least feverish since March 6.  She saw her PCP Dr. Hulan Fess who ruled out influenza and UTI. She has also seen rheumatology over the last couple weeks and had a rheumatological workup that did not show overt evidence of a primary autoimmune condition. She was noted to be neg for ANA, RF and CCP Ab. Interesting was noted to have low C4 levels.  She notes that her resting HR has been elevated and describes her heart rate as tachycardic. She had an ECHO on 12/26/17 which showed a normal EF, and was then referred to see infectious disease: CMV and TB Quantiferon were negative.  She notes that the ID team told her there was no clear evidence of an infectious process. TTE no vegetation. BL Bcx neg.  She notes that last week she developed a cough, but believes it to be allergy related. She notes that she has had an enlarged spleen since at least October 2018 revealed by a 07/31/17 US Abdomen. She notes some abdominal discomfort, and has felt that she gets full quickly after eating.  In the last year she intentionally dropped about 20 lbs of weight with change in her diet and avoiding most carbs. She notes that in the last 3 weeks, since she began feeling ill this month, she has lost about 6-7 lbs.     She also notes that she has been having night sweats that have returned this month after a few months of night sweats last year which abated in August 2018. She notes no other signs of recent infection, and denies any travel outside of the country. She denies any concern of insect bites.   She notes that in [place] she was on iron pills for low Hgb, which increased her Hgb. She stopped taking after her Hgb normalized.  She notes that in March 2018 she had a bad stomach bug and in December 2017 she had influenza.  She notes that she has been under a lot of stress with the deaths of her mother and step-father in a couple weeks of one another.   The pt reports that she has seen rheumatology twice for her hand, knee and hip pain but was confirmed to have osteoarthritis as opposed to rheumatoid arthritis. She notes that the severity of her joint pains mirror the content of her diet. She notes that her joint have never been red or swollen.   She notes that she will be having an iron infusion on 01/03/18.  Of note prior to the patient's visit today, pt has had BM Bx completed on 12/27/17 with results revealing HYPERCELLULAR BONE MARROW FOR AGE WITH NUMEROUS ATYPICAL LYMPHOID AGGREGATES. - TRILINEAGE HEMATOPOIESIS. On 12/27/17 the patient's flow cytometry revealed A MINOR  MONOCLONAL B-CELL POPULATION IDENTIFIED.  Flow cytometric analysis shows a minor population of monoclonal B cells (17% of all lymphocytes) expressing pan B-cell antigens including CD20 with lack of CD5, CD10, CD25 or CD103 expression. Immunohistochemical stains were also performed and show that the lymphoid aggregates show a mixture of T and B cells with slight predominance of T cells. No significant cyclin D1, CD34, or TdT positivity is identified. The overall findings are limited but slightly favor involvement by a B-cell lymphoproliferative process. Consideration was given to marginal zone lymphoma and splenic lymphoma  especially in the presence of splenomegaly. Nonetheless, additional material (ie lymph node or tissue biopsy) is strongly recommended for further evaluation.  I discussed the BM Bx results with Dr Gari Crown -- overall BM Bx + clinical picture certainly concerning for SMZL with possible coombs neg AIHA and possible autoimmune phenomenon related to [place]?Marland Kitchen  Most recent lab results (12/30/17) of CBC  is as follows: all values are WNL except for WBC at 3.5k, RBC at 3.18, Hgb at 7.6, HCT at 24.4, MCV at 76.7, MCH at 23.9, MCHC at 31.1, RDW at 17.0. LDH 12/30/17 was at 616. Haptoglobin<10 -- suggestive of hemolysis. C-reactive protein 09/10/17 was elevated at 20.5.   On review of systems, pt reports low-grade fever, night sweats, some fatigue, tachycardia, occasional lightheadedness, and denies dizziness, weakness, noticing any enlarged lymph nodes, changes in bowel habits, abdominal pains, abnormal vaginal discharge, and any other symptoms.   On PMHx the pt reports high blood pressure and degenerative arthritis. On Family Hx the pt reports her father had rheumatoid arthritis, her mother had kidney disease.  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 The patient's last visit with [place] was on 07/03/18. She is accompanied today by her sister. The pt reports that she is doing well overall.   The pt notes that she did have a rash on the front base of her neck a few weeks after her last infusion in September. She denies itching or welts or other symptoms. She notes that steroid cream resolved the issue.   The pt denies red/swollen joints but notes that her hip joints have been hurting when walking up or down stairs. She notes that the hip pain is new for her, and she has had knee pain which is not new.   The pt notes that she has been eating "too well" and has gained 8 pounds in the interim. She is returning to her  pre-diagnosis weight. Overall, the patient notes that she is able to do all she wants to do at this point.   The pt denies any fevers, chills, night sweats, unexpected weight loss or abdominal pains.   Lab results today (09/11/18) of CBC w/diff and CMP is as follows: all values are WNL except for Glucose at 105. 09/11/18 LDH is pending 09/11/18 Ferritin is pending   On review of systems, pt reports eating well, weight gain, new hip pain when walking stairs, staying active, and denies fevers, chills, night sweats, unexpected weight loss, abdominal pains, red/swollen joints, mouth sores, back pains, leg swelling, and any other symptoms.   MEDICAL HISTORY:  Past Medical History:  Diagnosis Date  . Arthritis   . Hypertension     SURGICAL HISTORY: History reviewed. No pertinent surgical history.  SOCIAL HISTORY: Social History   Socioeconomic History  . Marital status: Single    Spouse name: Not on file  . Number of  children: Not on file  . Years of education: Not on file  . Highest education level: Not on file  Occupational History  . Not on file  Social Needs  . Financial resource strain: Not on file  . Food insecurity:    Worry: Not on file    Inability: Not on file  . Transportation needs:    Medical: Not on file    Non-medical: Not on file  Tobacco Use  . Smoking status: Never Smoker  . Smokeless tobacco: Never Used  Substance and Sexual Activity  . Alcohol use: Not Currently    Alcohol/week: 0.0 standard drinks  . Drug use: Never  . Sexual activity: Not on file  Lifestyle  . Physical activity:    Days per week: Not on file    Minutes per session: Not on file  . Stress: Not on file  Relationships  . Social connections:    Talks on phone: Not on file    Gets together: Not on file    Attends religious service: Not on file    Active member of club or organization: Not on file    Attends meetings of clubs or organizations: Not on file    Relationship status: Not on  file  . Intimate partner violence:    Fear of current or ex partner: Not on file    Emotionally abused: Not on file    Physically abused: Not on file    Forced sexual activity: Not on file  Other Topics Concern  . Not on file  Social History Narrative  . Not on file    FAMILY HISTORY: Family History  Problem Relation Age of Onset  . Diabetes Mother   . Hyperlipidemia Mother   . Hypertension Mother     ALLERGIES:  is allergic to biaxin [clarithromycin].  MEDICATIONS:  Current Outpatient Medications  Medication Sig Dispense Refill  . folic acid (FOLVITE) 235 MCG tablet Take 800 mcg by mouth daily.     . vitamin B-12 (CYANOCOBALAMIN) 1000 MCG tablet Take 1,000 mcg by mouth daily.     No current facility-administered medications for this visit.     REVIEW OF SYSTEMS:    A 10+ POINT REVIEW OF SYSTEMS WAS OBTAINED including neurology, dermatology, psychiatry, cardiac, respiratory, lymph, extremities, GI, GU, Musculoskeletal, constitutional, breasts, reproductive, HEENT.  All pertinent positives are noted in the HPI.  All others are negative.    PHYSICAL EXAMINATION: ECOG PERFORMANCE STATUS: 1 - Symptomatic but completely ambulatory  Vitals:   09/11/18 0900  BP: (!) 143/82  Pulse: 71  Resp: 16  Temp: 98.7 F (37.1 C)  SpO2: 98%   Filed Weights   09/11/18 0900  Weight: 137 lb 4.8 oz (62.3 kg)   .Body mass index is 24.71 kg/m.  GENERAL:alert, in no acute distress and comfortable SKIN: no acute rashes, no significant lesions EYES: conjunctiva are pink and non-injected, sclera anicteric OROPHARYNX: MMM, no exudates, no oropharyngeal erythema or ulceration NECK: supple, no JVD LYMPH:  no palpable lymphadenopathy in the cervical, axillary or inguinal regions LUNGS: clear to auscultation b/l with normal respiratory effort HEART: regular rate & rhythm ABDOMEN:  normoactive bowel sounds , non tender, not distended. No palpable hepatosplenomegaly.  Extremity: no pedal  edema PSYCH: alert & oriented x 3 with fluent speech NEURO: no focal motor/sensory deficits   LABORATORY DATA:  I have reviewed the data as listed  . CBC Latest Ref Rng & Units 09/11/2018 07/03/2018 04/25/2018  WBC 4.0 - 10.5 K/uL  4.1 4.4 4.7  Hemoglobin 12.0 - 15.0 g/dL 14.7 14.5 14.1  Hematocrit 36.0 - 46.0 % 42.1 41.5 40.3  Platelets 150 - 400 K/uL 187 183 209   . CBC    Component Value Date/Time   WBC 4.1 09/11/2018 0842   RBC 4.90 09/11/2018 0842   HGB 14.7 09/11/2018 0842   HGB 14.5 07/03/2018 0835   HGB 12.8 09/10/2017 0801   HCT 42.1 09/11/2018 0842   HCT 39.4 09/10/2017 0801   PLT 187 09/11/2018 0842   PLT 183 07/03/2018 0835   PLT 144 (L) 09/10/2017 0801   MCV 85.9 09/11/2018 0842   MCV 79.0 (L) 09/10/2017 0801   MCH 30.0 09/11/2018 0842   MCHC 34.9 09/11/2018 0842   RDW 12.6 09/11/2018 0842   RDW 15.6 (H) 09/10/2017 0801   LYMPHSABS 1.0 09/11/2018 0842   LYMPHSABS 0.9 09/10/2017 0801   MONOABS 0.4 09/11/2018 0842   MONOABS 0.4 09/10/2017 0801   EOSABS 0.1 09/11/2018 0842   EOSABS 0.1 09/10/2017 0801   BASOSABS 0.0 09/11/2018 0842   BASOSABS 0.0 09/10/2017 0801    . CMP Latest Ref Rng & Units 09/11/2018 07/03/2018 04/25/2018  Glucose 70 - 99 mg/dL 105(H) 106(H) 112(H)  BUN 8 - 23 mg/dL 22 17 26(H)  Creatinine 0.44 - 1.00 mg/dL 0.80 0.75 0.80  Sodium 135 - 145 mmol/L 140 143 140  Potassium 3.5 - 5.1 mmol/L 4.2 4.2 4.4  Chloride 98 - 111 mmol/L 109 108 105  CO2 22 - 32 mmol/L 22 26 27   Calcium 8.9 - 10.3 mg/dL 9.4 9.6 9.6  Total Protein 6.5 - 8.1 g/dL 6.5 6.5 6.7  Total Bilirubin 0.3 - 1.2 mg/dL 0.9 1.0 0.8  Alkaline Phos 38 - 126 U/L 68 67 86  AST 15 - 41 U/L 20 16 19   ALT 0 - 44 U/L 26 20 23    . Lab Results  Component Value Date   LDH 230 (H) 09/11/2018     12/27/17 BM Bx:   12/27/17 Flow Cytometry:     RADIOGRAPHIC STUDIES: I have personally reviewed the radiological images as listed and agreed with the findings in the report. No results  found.  ASSESSMENT & PLAN:   62 y.o. female with  1. S/p ancytopenia (resolving) due to recently diagnosed Splenic marginal zone lymphoma. Patient appeared to be have significant constitutional symptoms . No evidence of rheumatological or infectious etiology for her fevers and night sweats. Constitutional symptoms now resolved.  2. S/p Splenomegaly - likely due to [place]. Marland Kitchenimproved on [place] abd. Clinical no palpable splenomegaly today.   04/18/18 US Abdomen revealed Splenomegaly with a length of 13.3 cm and a volume of 432 cc. By report, the spleen measured up to 15.1 cm in maximum dimension on the PET-CT from January 08, 2018 suggesting interval improvement.   3. Anemia - with elevated LDH and low haptoglobin concerning for hemolysis. Coombs neg . Could be IgA driven Coombs neg AIHA. +ve reticulocytosis and Smear with some microspherocytes. No over urine hemosiderinuria. Partly anemia could also be from hypersplenism.  hgb normal now at 14.1  4. Low C4 ? Immune complex disease/autoimmunity related to lymphoma. Per rheumatology - no evidence of primary rheumatological disorder. No overt evidence of endocarditis. C3 - WNL C4 improving from <2 to 7 to 11. c3 wnl at 108 5. Fh/o evers/chills/night sweat -- likely constitutional symptoms related to ?lymphoma - now resolved. 6. Thrombocytopenia likely from splenomegaly vs lymphoma-- resolved. PLT resolved to 187k on 09/11/18 labs  PLAN:  -Discussed pt labwork today, 09/11/18; blood counts and chemistries are normal -09/11/18 Ferritin is adequate  -The pt has no prohibitive toxicities from continuing her third maintenance Rituxan infusion at this time.   -Will continue to watch for constitutional symptoms  -Recommend continuing B complex and Folic Acid supplement to support accelerated hematopoiesis -Will see the pt back in 2 months with fourth maintenance Rituxan      Please schedule next 2 doses of maintenance Rituxan q60days with labs and MD  visit   All of the  patients questions were answered with apparent satisfaction. The patient knows to call the clinic with any problems, questions or concerns.  The total time spent in the appt was 25 minutes and more than 50% was on counseling and direct patient cares.     Sullivan Lone MD MS AAHIVMS [facility] Hematology/Oncology Physician [facility]  (Office):       [phone] (Work cell):  [phone] (Fax):           [phone]  09/11/2018 9:51 AM  I, Baldwin [place], am acting as a scribe for Dr. Sullivan Lone.   .I have reviewed the above documentation for accuracy and completeness, and I agree with the above. Brunetta Genera MD

## 2018-09-11 ENCOUNTER — Telehealth: Payer: Self-pay

## 2018-09-11 ENCOUNTER — Inpatient Hospital Stay: Payer: 59 | Attending: Hematology and Oncology

## 2018-09-11 ENCOUNTER — Inpatient Hospital Stay: Payer: 59

## 2018-09-11 ENCOUNTER — Inpatient Hospital Stay (HOSPITAL_BASED_OUTPATIENT_CLINIC_OR_DEPARTMENT_OTHER): Payer: 59 | Admitting: Hematology

## 2018-09-11 ENCOUNTER — Encounter: Payer: Self-pay | Admitting: Hematology

## 2018-09-11 VITALS — BP 143/82 | HR 71 | Temp 98.7°F | Resp 16 | Ht 62.5 in | Wt 137.3 lb

## 2018-09-11 VITALS — BP 137/78 | HR 75 | Temp 98.1°F | Resp 17

## 2018-09-11 DIAGNOSIS — Z7189 Other specified counseling: Secondary | ICD-10-CM

## 2018-09-11 DIAGNOSIS — D61818 Other pancytopenia: Secondary | ICD-10-CM | POA: Insufficient documentation

## 2018-09-11 DIAGNOSIS — C8307 Small cell B-cell lymphoma, spleen: Secondary | ICD-10-CM

## 2018-09-11 DIAGNOSIS — Z5112 Encounter for antineoplastic immunotherapy: Secondary | ICD-10-CM | POA: Diagnosis not present

## 2018-09-11 DIAGNOSIS — D509 Iron deficiency anemia, unspecified: Secondary | ICD-10-CM

## 2018-09-11 LAB — CBC WITH DIFFERENTIAL/PLATELET
Abs Immature Granulocytes: 0 10*3/uL (ref 0.00–0.07)
Basophils Absolute: 0 10*3/uL (ref 0.0–0.1)
Basophils Relative: 1 %
Eosinophils Absolute: 0.1 10*3/uL (ref 0.0–0.5)
Eosinophils Relative: 3 %
HCT: 42.1 % (ref 36.0–46.0)
Hemoglobin: 14.7 g/dL (ref 12.0–15.0)
Immature Granulocytes: 0 %
Lymphocytes Relative: 25 %
Lymphs Abs: 1 10*3/uL (ref 0.7–4.0)
MCH: 30 pg (ref 26.0–34.0)
MCHC: 34.9 g/dL (ref 30.0–36.0)
MCV: 85.9 fL (ref 80.0–100.0)
Monocytes Absolute: 0.4 10*3/uL (ref 0.1–1.0)
Monocytes Relative: 11 %
Neutro Abs: 2.5 10*3/uL (ref 1.7–7.7)
Neutrophils Relative %: 60 %
Platelets: 187 10*3/uL (ref 150–400)
RBC: 4.9 MIL/uL (ref 3.87–5.11)
RDW: 12.6 % (ref 11.5–15.5)
WBC: 4.1 10*3/uL (ref 4.0–10.5)
nRBC: 0 % (ref 0.0–0.2)

## 2018-09-11 LAB — CMP (CANCER CENTER ONLY)
ALT: 26 U/L (ref 0–44)
AST: 20 U/L (ref 15–41)
Albumin: 4.4 g/dL (ref 3.5–5.0)
Alkaline Phosphatase: 68 U/L (ref 38–126)
Anion gap: 9 (ref 5–15)
BUN: 22 mg/dL (ref 8–23)
CO2: 22 mmol/L (ref 22–32)
Calcium: 9.4 mg/dL (ref 8.9–10.3)
Chloride: 109 mmol/L (ref 98–111)
Creatinine: 0.8 mg/dL (ref 0.44–1.00)
GFR, Est AFR Am: >60 mL/min (ref >60)
GFR, Estimated: >60 mL/min (ref >60)
Glucose, Bld: 105 mg/dL — ABNORMAL HIGH (ref 70–99)
Potassium: 4.2 mmol/L (ref 3.5–5.1)
Sodium: 140 mmol/L (ref 135–145)
Total Bilirubin: 0.9 mg/dL (ref 0.3–1.2)
Total Protein: 6.5 g/dL (ref 6.5–8.1)

## 2018-09-11 LAB — FERRITIN: Ferritin: 340 ng/mL — ABNORMAL HIGH (ref 11–307)

## 2018-09-11 LAB — LACTATE DEHYDROGENASE: LDH: 230 U/L — ABNORMAL HIGH (ref 98–192)

## 2018-09-11 MED ORDER — METHYLPREDNISOLONE SODIUM SUCC 125 MG IJ SOLR
INTRAMUSCULAR | Status: AC
  Filled 2018-09-11: qty 2

## 2018-09-11 MED ORDER — DIPHENHYDRAMINE HCL 25 MG PO CAPS
ORAL_CAPSULE | ORAL | Status: AC
  Filled 2018-09-11: qty 2

## 2018-09-11 MED ORDER — METHYLPREDNISOLONE SODIUM SUCC 125 MG IJ SOLR
125.0000 mg | Freq: Once | INTRAMUSCULAR | Status: AC
  Administered 2018-09-11: 125 mg via INTRAVENOUS

## 2018-09-11 MED ORDER — FAMOTIDINE 20 MG PO TABS
ORAL_TABLET | ORAL | Status: AC
  Filled 2018-09-11: qty 1

## 2018-09-11 MED ORDER — SODIUM CHLORIDE 0.9 % IV SOLN
Freq: Once | INTRAVENOUS | Status: AC
  Administered 2018-09-11: 10:00:00 via INTRAVENOUS
  Filled 2018-09-11: qty 250

## 2018-09-11 MED ORDER — ACETAMINOPHEN 325 MG PO TABS
ORAL_TABLET | ORAL | Status: AC
  Filled 2018-09-11: qty 2

## 2018-09-11 MED ORDER — SODIUM CHLORIDE 0.9 % IV SOLN
375.0000 mg/m2 | Freq: Once | INTRAVENOUS | Status: AC
  Administered 2018-09-11: 600 mg via INTRAVENOUS
  Filled 2018-09-11: qty 50

## 2018-09-11 MED ORDER — FAMOTIDINE 20 MG PO TABS
20.0000 mg | ORAL_TABLET | Freq: Once | ORAL | Status: AC
  Administered 2018-09-11: 20 mg via ORAL

## 2018-09-11 MED ORDER — ACETAMINOPHEN 325 MG PO TABS
650.0000 mg | ORAL_TABLET | Freq: Once | ORAL | Status: AC
  Administered 2018-09-11: 650 mg via ORAL

## 2018-09-11 MED ORDER — DIPHENHYDRAMINE HCL 25 MG PO CAPS
50.0000 mg | ORAL_CAPSULE | Freq: Once | ORAL | Status: AC
  Administered 2018-09-11: 50 mg via ORAL

## 2018-09-11 NOTE — Telephone Encounter (Signed)
Per 12/5 los patient declined avs and calender due to [employer].

## 2018-09-11 NOTE — Patient Instructions (Signed)
Thank you for choosing [facility] to provide your oncology and hematology care.  To afford each patient quality time with our providers, please arrive 30 minutes before your scheduled appointment time.  If you arrive late for your appointment, you may be asked to reschedule.  We strive to give you quality time with our providers, and arriving late affects you and other patients whose appointments are after yours.    If you are a no show for multiple scheduled visits, you may be dismissed from the clinic at the providers discretion.     Again, thank you for choosing [facility], our hope is that these requests will decrease the amount of time that you wait before being seen by our physicians.  ______________________________________________________________________   Should you have questions after your visit to the [facility], please contact our office at [phone] between the hours of 8:30 and 4:30 p.m.    Voicemails left after 4:30p.m will not be returned until the following business day.     For prescription refill requests, please have your pharmacy contact us directly.  Please also try to allow 48 hours for prescription requests.     Please contact the scheduling department for questions regarding scheduling.  For scheduling of procedures such as PET scans, CT scans, MRI, Ultrasound, etc please contact central scheduling at [phone].     Resources For Cancer Patients and Caregivers:    Oncolink.org:  A wonderful resource for patients and healthcare providers for information regarding your disease, ways to tract your treatment, what to expect, etc.      American Cancer Society:  800-227-2345  Can help patients locate various types of support and financial assistance   Cancer Care: 1-800-813-HOPE (4673) Provides financial assistance, online support groups, medication/co-pay assistance.     [place] DSS:  336-641-3447 Where to apply  for food stamps, Medicaid, and utility assistance   Medicare Rights Center: 800-333-4114 Helps people with Medicare understand their rights and benefits, navigate the Medicare system, and secure the quality healthcare they deserve   SCAT: 336-333-6589 [employer] [employer]'s shared-ride transportation service for eligible riders who have a disability that prevents them from riding the fixed route bus.     For additional information on assistance programs please contact our social worker:   Abigail Elmore:  [phone]  

## 2018-09-11 NOTE — Progress Notes (Signed)
Per Dr. Irene Limbo: Hep B panel from April 2019 acceptable for treatment criteria today for Rituxan, cycle 3

## 2018-11-07 NOTE — Progress Notes (Signed)
HEMATOLOGY/ONCOLOGY CLINIC NOTE  Date of Service: 11/10/18    Patient Care Team: Hulan Fess, MD as PCP - General (Family Medicine)  CHIEF COMPLAINTS/PURPOSE OF CONSULTATION:  F/u for mx of SMZL  HISTORY OF PRESENTING ILLNESS:   Emily Foley is a wonderful 63 y.o. 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 She is accompanied today by three members of her family. The pt reports that she is doing well overall.   The pt notes that on March 6 she developed a low-grade fever and continues to have a fever. She has kept her fever at [place] with Tylenol every 6 hours. She notes that today she has felt the least feverish since March 6.  She saw her PCP Dr. Hulan Fess who ruled out influenza and UTI. She has also seen rheumatology over the last couple weeks and had a rheumatological workup that did not show overt evidence of a primary autoimmune condition. She was noted to be neg for ANA, RF and CCP Ab. Interesting was noted to have low C4 levels.  She notes that her resting HR has been elevated and describes her heart rate as tachycardic. She had an ECHO on 12/26/17 which showed a normal EF, and was then referred to see infectious disease: CMV and TB Quantiferon were negative.  She notes that the ID team told her there was no clear evidence of an infectious process. TTE no vegetation. BL Bcx neg.  She notes that last week she developed a cough, but believes it to be allergy related. She notes that she has had an enlarged spleen since at least October 2018 revealed by a 07/31/17 US Abdomen. She notes some abdominal discomfort, and has felt that she gets full quickly after eating.  In the last year she intentionally dropped about 20 lbs of weight with change in her diet and avoiding most carbs. She notes that in the last 3 weeks, since she began feeling ill this month, she has lost about 6-7 lbs.     She also notes that she has been having night sweats that have returned this month after a few months of night sweats last year which abated in August 2018. She notes no other signs of recent infection, and denies any travel outside of the country. She denies any concern of insect bites.   She notes that in [place] she was on iron pills for low Hgb, which increased her Hgb. She stopped taking after her Hgb normalized.  She notes that in March 2018 she had a bad stomach bug and in December 2017 she had influenza.  She notes that she has been under a lot of stress with the deaths of her mother and step-father in a couple weeks of one another.   The pt reports that she has seen rheumatology twice for her hand, knee and hip pain but was confirmed to have osteoarthritis as opposed to rheumatoid arthritis. She notes that the severity of her joint pains mirror the content of her diet. She notes that her joint have never been red or swollen.   She notes that she will be having an iron infusion on 01/03/18.  Of note prior to the patient's visit today, pt has had BM Bx completed on 12/27/17 with results revealing HYPERCELLULAR BONE MARROW FOR AGE WITH NUMEROUS ATYPICAL LYMPHOID AGGREGATES. - TRILINEAGE HEMATOPOIESIS. On 12/27/17 the patient's flow cytometry revealed A MINOR  MONOCLONAL B-CELL POPULATION IDENTIFIED.  Flow cytometric analysis shows a minor population of monoclonal B cells (17% of all lymphocytes) expressing pan B-cell antigens including CD20 with lack of CD5, CD10, CD25 or CD103 expression. Immunohistochemical stains were also performed and show that the lymphoid aggregates show a mixture of T and B cells with slight predominance of T cells. No significant cyclin D1, CD34, or TdT positivity is identified. The overall findings are limited but slightly favor involvement by a B-cell lymphoproliferative process. Consideration was given to marginal zone lymphoma and splenic lymphoma  especially in the presence of splenomegaly. Nonetheless, additional material (ie lymph node or tissue biopsy) is strongly recommended for further evaluation.  I discussed the BM Bx results with Dr Gari Crown -- overall BM Bx + clinical picture certainly concerning for SMZL with possible coombs neg AIHA and possible autoimmune phenomenon related to [place]?Marland Kitchen  Most recent lab results (12/30/17) of CBC  is as follows: all values are WNL except for WBC at 3.5k, RBC at 3.18, Hgb at 7.6, HCT at 24.4, MCV at 76.7, MCH at 23.9, MCHC at 31.1, RDW at 17.0. LDH 12/30/17 was at 616. Haptoglobin<10 -- suggestive of hemolysis. C-reactive protein 09/10/17 was elevated at 20.5.   On review of systems, pt reports low-grade fever, night sweats, some fatigue, tachycardia, occasional lightheadedness, and denies dizziness, weakness, noticing any enlarged lymph nodes, changes in bowel habits, abdominal pains, abnormal vaginal discharge, and any other symptoms.   On PMHx the pt reports high blood pressure and degenerative arthritis. On Family Hx the pt reports her father had rheumatoid arthritis, her mother had kidney disease.  Interval History:  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 The patient's last visit with [place] was on 09/11/18. The pt reports that she is doing well overall.   The pt reports that she has not developed any new concerns in the interim. She recently enjoyed her holidays very much. She denies any joint pains, fevers, chills, night sweats, or abdominal pains. She notes that she has continued to eat very well, and endorses some minimal weight gain since our last visit. The pt notes that she continues to tolerate maintenance Rituxan very well.   Lab results today (11/10/18) of CBC w/diff is as follows: all values are WNL. 11/10/18 CMP is stable. 11/10/18 LDH is . Lab Results  Component Value Date   LDH 229 (H) 11/10/2018   On review  of systems, pt reports eating well, weight gain, good energy levels, and denies fevers, chills, night sweats, abdominal pains, noticing any new lumps or bumps, joint pains, new back pains, abdominal pains, leg swelling, and any other symptoms.   MEDICAL HISTORY:  Past Medical History:  Diagnosis Date  . Arthritis   . Hypertension     SURGICAL HISTORY: No past surgical history on file.  SOCIAL HISTORY: Social History   Socioeconomic History  . Marital status: Single    Spouse name: Not on file  . Number of children: Not on file  . Years of education: Not on file  . Highest education level: Not on file  Occupational History  . Not on file  Social Needs  . Financial resource strain: Not on file  . Food insecurity:    Worry: Not on file    Inability: Not on file  . Transportation needs:    Medical: Not on file    Non-medical: Not on file  Tobacco Use  . Smoking status:  Never Smoker  . Smokeless tobacco: Never Used  Substance and Sexual Activity  . Alcohol use: Not Currently    Alcohol/week: 0.0 standard drinks  . Drug use: Never  . Sexual activity: Not on file  Lifestyle  . Physical activity:    Days per week: Not on file    Minutes per session: Not on file  . Stress: Not on file  Relationships  . Social connections:    Talks on phone: Not on file    Gets together: Not on file    Attends religious service: Not on file    Active member of club or organization: Not on file    Attends meetings of clubs or organizations: Not on file    Relationship status: Not on file  . Intimate partner violence:    Fear of current or ex partner: Not on file    Emotionally abused: Not on file    Physically abused: Not on file    Forced sexual activity: Not on file  Other Topics Concern  . Not on file  Social History Narrative  . Not on file    FAMILY HISTORY: Family History  Problem Relation Age of Onset  . Diabetes Mother   . Hyperlipidemia Mother   . Hypertension Mother      ALLERGIES:  is allergic to biaxin [clarithromycin].  MEDICATIONS:  Current Outpatient Medications  Medication Sig Dispense Refill  . folic acid (FOLVITE) 914 MCG tablet Take 800 mcg by mouth daily.     . vitamin B-12 (CYANOCOBALAMIN) 1000 MCG tablet Take 1,000 mcg by mouth daily.     No current facility-administered medications for this visit.     REVIEW OF SYSTEMS:    A 10+ POINT REVIEW OF SYSTEMS WAS OBTAINED including neurology, dermatology, psychiatry, cardiac, respiratory, lymph, extremities, GI, GU, Musculoskeletal, constitutional, breasts, reproductive, HEENT.  All pertinent positives are noted in the HPI.  All others are negative.     PHYSICAL EXAMINATION: ECOG PERFORMANCE STATUS: 1 - Symptomatic but completely ambulatory  Vitals:   11/10/18 0838  BP: (!) 142/72  Pulse: 74  Resp: 18  Temp: 98.2 F (36.8 C)  SpO2: 98%   Filed Weights   11/10/18 0838  Weight: 136 lb 6.4 oz (61.9 kg)   .Body mass index is 24.55 kg/m.  GENERAL:alert, in no acute distress and comfortable SKIN: no acute rashes, no significant lesions EYES: conjunctiva are pink and non-injected, sclera anicteric OROPHARYNX: MMM, no exudates, no oropharyngeal erythema or ulceration NECK: supple, no JVD LYMPH:  no palpable lymphadenopathy in the cervical, axillary or inguinal regions LUNGS: clear to auscultation b/l with normal respiratory effort HEART: regular rate & rhythm ABDOMEN:  normoactive bowel sounds , non tender, not distended. No palpable hepatosplenomegaly.  Extremity: no pedal edema PSYCH: alert & oriented x 3 with fluent speech NEURO: no focal motor/sensory deficits   LABORATORY DATA:  I have reviewed the data as listed  . CBC Latest Ref Rng & Units 11/10/2018 09/11/2018 07/03/2018  WBC 4.0 - 10.5 K/uL 4.5 4.1 4.4  Hemoglobin 12.0 - 15.0 g/dL 14.8 14.7 14.5  Hematocrit 36.0 - 46.0 % 43.4 42.1 41.5  Platelets 150 - 400 K/uL 186 187 183   . CBC    Component Value Date/Time    WBC 4.5 11/10/2018 0807   RBC 5.09 11/10/2018 0807   HGB 14.8 11/10/2018 0807   HGB 14.5 07/03/2018 0835   HGB 12.8 09/10/2017 0801   HCT 43.4 11/10/2018 0807   HCT 39.4 09/10/2017  0801   PLT 186 11/10/2018 0807   PLT 183 07/03/2018 0835   PLT 144 (L) 09/10/2017 0801   MCV 85.3 11/10/2018 0807   MCV 79.0 (L) 09/10/2017 0801   MCH 29.1 11/10/2018 0807   MCHC 34.1 11/10/2018 0807   RDW 12.7 11/10/2018 0807   RDW 15.6 (H) 09/10/2017 0801   LYMPHSABS 1.1 11/10/2018 0807   LYMPHSABS 0.9 09/10/2017 0801   MONOABS 0.4 11/10/2018 0807   MONOABS 0.4 09/10/2017 0801   EOSABS 0.1 11/10/2018 0807   EOSABS 0.1 09/10/2017 0801   BASOSABS 0.0 11/10/2018 0807   BASOSABS 0.0 09/10/2017 0801    . CMP Latest Ref Rng & Units 09/11/2018 07/03/2018 04/25/2018  Glucose 70 - 99 mg/dL 105(H) 106(H) 112(H)  BUN 8 - 23 mg/dL 22 17 26(H)  Creatinine 0.44 - 1.00 mg/dL 0.80 0.75 0.80  Sodium 135 - 145 mmol/L 140 143 140  Potassium 3.5 - 5.1 mmol/L 4.2 4.2 4.4  Chloride 98 - 111 mmol/L 109 108 105  CO2 22 - 32 mmol/L 22 26 27   Calcium 8.9 - 10.3 mg/dL 9.4 9.6 9.6  Total Protein 6.5 - 8.1 g/dL 6.5 6.5 6.7  Total Bilirubin 0.3 - 1.2 mg/dL 0.9 1.0 0.8  Alkaline Phos 38 - 126 U/L 68 67 86  AST 15 - 41 U/L 20 16 19   ALT 0 - 44 U/L 26 20 23    . Lab Results  Component Value Date   LDH 230 (H) 09/11/2018     12/27/17 BM Bx:   12/27/17 Flow Cytometry:     RADIOGRAPHIC STUDIES: I have personally reviewed the radiological images as listed and agreed with the findings in the report. No results found.  ASSESSMENT & PLAN:   63 y.o. female with  1. S/p ancytopenia (resolving) due to recently diagnosed Splenic marginal zone lymphoma. Patient appeared to be have significant constitutional symptoms . No evidence of rheumatological or infectious etiology for her fevers and night sweats. Constitutional symptoms now resolved.  2. S/p Splenomegaly - likely due to [place]. Marland Kitchenimproved on [place] abd. Clinical no  palpable splenomegaly today.   04/18/18 US Abdomen revealed Splenomegaly with a length of 13.3 cm and a volume of 432 cc. By report, the spleen measured up to 15.1 cm in maximum dimension on the PET-CT from January 08, 2018 suggesting interval improvement.   3. Anemia - with elevated LDH and low haptoglobin concerning for hemolysis. Coombs neg . Could be IgA driven Coombs neg AIHA. +ve reticulocytosis and Smear with some microspherocytes. No over urine hemosiderinuria. Partly anemia could also be from hypersplenism.  hgb normal now at 14.1  4. Low C4 ? Immune complex disease/autoimmunity related to lymphoma. Per rheumatology - no evidence of primary rheumatological disorder. No overt evidence of endocarditis. C3 - WNL C4 improving from <2 to 7 to 11. c3 wnl at 108 5. Fh/o evers/chills/night sweat -- likely constitutional symptoms related to ?lymphoma - now resolved. 6. Thrombocytopenia likely from splenomegaly vs lymphoma-- resolved. PLT resolved to 187k on 09/11/18 labs  PLAN:  -Discussed pt labwork today, 11/10/18; blood counts remain normal, other labs pending.  -The pt shows no clinical or lab progression/return of her Splenic Marginal Zone Lymphoma at this time. -The pt has no prohibitive toxicities from continuing her fourth cycle of maintenance Rituxan at this time. Orders placed and signed. -Discussed that the pt could complete up to 2 years of maintenance Rituxan, given her presentation  -The pt and I will continue to watch for constitutional symptoms  -  Continue Vitamin B complex and Folic Acid supplement to support accelerated hematopoiesis -Will see the pt back in 2 months     Please schedule next 2 doses of maintenance Rituxan q60days with labs and MD visit   All of the  patients questions were answered with apparent satisfaction. The patient knows to call the clinic with any problems, questions or concerns.  The total time spent in the appt was 25 minutes and more than 50% was on  counseling and direct patient cares.     Sullivan Lone MD MS AAHIVMS [facility] Hematology/Oncology Physician [facility]  (Office):       [phone] (Work cell):  [phone] (Fax):           [phone]  11/10/2018 8:58 AM  I, Baldwin [place], am acting as a scribe for Dr. Sullivan Lone.   .I have reviewed the above documentation for accuracy and completeness, and I agree with the above. Brunetta Genera MD

## 2018-11-10 ENCOUNTER — Inpatient Hospital Stay: Payer: 59

## 2018-11-10 ENCOUNTER — Ambulatory Visit: Payer: 59

## 2018-11-10 ENCOUNTER — Inpatient Hospital Stay: Payer: 59 | Attending: Hematology and Oncology | Admitting: Hematology

## 2018-11-10 VITALS — BP 136/64 | HR 87 | Temp 98.7°F | Resp 16

## 2018-11-10 VITALS — BP 142/72 | HR 74 | Temp 98.2°F | Resp 18 | Ht 62.5 in | Wt 136.4 lb

## 2018-11-10 DIAGNOSIS — C8307 Small cell B-cell lymphoma, spleen: Secondary | ICD-10-CM

## 2018-11-10 DIAGNOSIS — Z7189 Other specified counseling: Secondary | ICD-10-CM

## 2018-11-10 DIAGNOSIS — R161 Splenomegaly, not elsewhere classified: Secondary | ICD-10-CM | POA: Diagnosis not present

## 2018-11-10 DIAGNOSIS — Z5112 Encounter for antineoplastic immunotherapy: Secondary | ICD-10-CM | POA: Diagnosis not present

## 2018-11-10 DIAGNOSIS — D649 Anemia, unspecified: Secondary | ICD-10-CM | POA: Insufficient documentation

## 2018-11-10 DIAGNOSIS — D696 Thrombocytopenia, unspecified: Secondary | ICD-10-CM | POA: Insufficient documentation

## 2018-11-10 LAB — CBC WITH DIFFERENTIAL/PLATELET
Abs Immature Granulocytes: 0.01 10*3/uL (ref 0.00–0.07)
Basophils Absolute: 0 10*3/uL (ref 0.0–0.1)
Basophils Relative: 0 %
Eosinophils Absolute: 0.1 10*3/uL (ref 0.0–0.5)
Eosinophils Relative: 2 %
HCT: 43.4 % (ref 36.0–46.0)
Hemoglobin: 14.8 g/dL (ref 12.0–15.0)
Immature Granulocytes: 0 %
Lymphocytes Relative: 24 %
Lymphs Abs: 1.1 10*3/uL (ref 0.7–4.0)
MCH: 29.1 pg (ref 26.0–34.0)
MCHC: 34.1 g/dL (ref 30.0–36.0)
MCV: 85.3 fL (ref 80.0–100.0)
Monocytes Absolute: 0.4 10*3/uL (ref 0.1–1.0)
Monocytes Relative: 10 %
Neutro Abs: 2.9 10*3/uL (ref 1.7–7.7)
Neutrophils Relative %: 64 %
Platelets: 186 10*3/uL (ref 150–400)
RBC: 5.09 MIL/uL (ref 3.87–5.11)
RDW: 12.7 % (ref 11.5–15.5)
WBC: 4.5 10*3/uL (ref 4.0–10.5)
nRBC: 0 % (ref 0.0–0.2)

## 2018-11-10 LAB — CMP (CANCER CENTER ONLY)
ALT: 24 U/L (ref 0–44)
AST: 20 U/L (ref 15–41)
Albumin: 4.5 g/dL (ref 3.5–5.0)
Alkaline Phosphatase: 71 U/L (ref 38–126)
Anion gap: 9 (ref 5–15)
BUN: 23 mg/dL (ref 8–23)
CO2: 26 mmol/L (ref 22–32)
Calcium: 9.4 mg/dL (ref 8.9–10.3)
Chloride: 108 mmol/L (ref 98–111)
Creatinine: 0.81 mg/dL (ref 0.44–1.00)
GFR, Est AFR Am: >60 mL/min (ref >60)
GFR, Estimated: >60 mL/min (ref >60)
Glucose, Bld: 115 mg/dL — ABNORMAL HIGH (ref 70–99)
Potassium: 4.1 mmol/L (ref 3.5–5.1)
Sodium: 143 mmol/L (ref 135–145)
Total Bilirubin: 0.9 mg/dL (ref 0.3–1.2)
Total Protein: 6.7 g/dL (ref 6.5–8.1)

## 2018-11-10 LAB — LACTATE DEHYDROGENASE: LDH: 229 U/L — ABNORMAL HIGH (ref 98–192)

## 2018-11-10 MED ORDER — SODIUM CHLORIDE 0.9 % IV SOLN
Freq: Once | INTRAVENOUS | Status: AC
  Administered 2018-11-10: 10:00:00 via INTRAVENOUS
  Filled 2018-11-10: qty 250

## 2018-11-10 MED ORDER — METHYLPREDNISOLONE SODIUM SUCC 125 MG IJ SOLR
125.0000 mg | Freq: Every day | INTRAMUSCULAR | Status: DC
  Administered 2018-11-10: 125 mg via INTRAVENOUS

## 2018-11-10 MED ORDER — FAMOTIDINE 20 MG PO TABS
ORAL_TABLET | ORAL | Status: AC
  Filled 2018-11-10: qty 1

## 2018-11-10 MED ORDER — ACETAMINOPHEN 325 MG PO TABS
650.0000 mg | ORAL_TABLET | Freq: Once | ORAL | Status: AC
  Administered 2018-11-10: 650 mg via ORAL

## 2018-11-10 MED ORDER — SODIUM CHLORIDE 0.9 % IV SOLN
375.0000 mg/m2 | Freq: Once | INTRAVENOUS | Status: AC
  Administered 2018-11-10: 600 mg via INTRAVENOUS
  Filled 2018-11-10: qty 50

## 2018-11-10 MED ORDER — FAMOTIDINE 20 MG PO TABS
20.0000 mg | ORAL_TABLET | Freq: Once | ORAL | Status: AC
  Administered 2018-11-10: 20 mg via ORAL

## 2018-11-10 MED ORDER — DIPHENHYDRAMINE HCL 25 MG PO CAPS
ORAL_CAPSULE | ORAL | Status: AC
  Filled 2018-11-10: qty 2

## 2018-11-10 MED ORDER — METHYLPREDNISOLONE SODIUM SUCC 125 MG IJ SOLR
INTRAMUSCULAR | Status: AC
  Filled 2018-11-10: qty 2

## 2018-11-10 MED ORDER — DIPHENHYDRAMINE HCL 25 MG PO CAPS
50.0000 mg | ORAL_CAPSULE | Freq: Once | ORAL | Status: AC
  Administered 2018-11-10: 50 mg via ORAL

## 2018-11-10 MED ORDER — ACETAMINOPHEN 325 MG PO TABS
ORAL_TABLET | ORAL | Status: AC
  Filled 2018-11-10: qty 2

## 2018-11-10 NOTE — Patient Instructions (Signed)
Thank you for choosing [facility] to provide your oncology and hematology care.  To afford each patient quality time with our providers, please arrive 30 minutes before your scheduled appointment time.  If you arrive late for your appointment, you may be asked to reschedule.  We strive to give you quality time with our providers, and arriving late affects you and other patients whose appointments are after yours.    If you are a no show for multiple scheduled visits, you may be dismissed from the clinic at the providers discretion.     Again, thank you for choosing [facility], our hope is that these requests will decrease the amount of time that you wait before being seen by our physicians.  ______________________________________________________________________   Should you have questions after your visit to the [facility], please contact our office at [phone] between the hours of 8:30 and 4:30 p.m.    Voicemails left after 4:30p.m will not be returned until the following business day.     For prescription refill requests, please have your pharmacy contact us directly.  Please also try to allow 48 hours for prescription requests.     Please contact the scheduling department for questions regarding scheduling.  For scheduling of procedures such as PET scans, CT scans, MRI, Ultrasound, etc please contact central scheduling at [phone].     Resources For Cancer Patients and Caregivers:    Oncolink.org:  A wonderful resource for patients and healthcare providers for information regarding your disease, ways to tract your treatment, what to expect, etc.      American Cancer Society:  800-227-2345  Can help patients locate various types of support and financial assistance   Cancer Care: 1-800-813-HOPE (4673) Provides financial assistance, online support groups, medication/co-pay assistance.     [place] DSS:  336-641-3447 Where to apply  for food stamps, Medicaid, and utility assistance   Medicare Rights Center: 800-333-4114 Helps people with Medicare understand their rights and benefits, navigate the Medicare system, and secure the quality healthcare they deserve   SCAT: 336-333-6589 [place] [employer]'s shared-ride transportation service for eligible riders who have a disability that prevents them from riding the fixed route bus.     For additional information on assistance programs please contact our social worker:   Abigail Elmore:  [phone]  

## 2018-11-10 NOTE — Patient Instructions (Signed)
[facility] Discharge Instructions for Patients Receiving Chemotherapy  Today you received the following chemotherapy agents Rituximab (RITUXAN).  To help prevent nausea and vomiting after your treatment, we encourage you to take your nausea medication as prescribed.   If you develop nausea and vomiting that is not controlled by your nausea medication, call the clinic.   BELOW ARE SYMPTOMS THAT SHOULD BE REPORTED IMMEDIATELY:  *FEVER GREATER THAN 100.5 F  *CHILLS WITH OR WITHOUT FEVER  NAUSEA AND VOMITING THAT IS NOT CONTROLLED WITH YOUR NAUSEA MEDICATION  *UNUSUAL SHORTNESS OF BREATH  *UNUSUAL BRUISING OR BLEEDING  TENDERNESS IN MOUTH AND THROAT WITH OR WITHOUT PRESENCE OF ULCERS  *URINARY PROBLEMS  *BOWEL PROBLEMS  UNUSUAL RASH Items with * indicate a potential emergency and should be followed up as soon as possible.  Feel free to call the clinic should you have any questions or concerns. The clinic phone number is [phone].  Please show the CHEMO ALERT CARD at check-in to the Emergency Department and triage nurse.   

## 2018-12-18 ENCOUNTER — Encounter: Payer: Self-pay | Admitting: Hematology

## 2018-12-19 ENCOUNTER — Telehealth: Payer: Self-pay | Admitting: *Deleted

## 2018-12-19 NOTE — Telephone Encounter (Signed)
Contacted patient at Dr. Grier Mitts request in response to Mychart question. Per Dr.Kale, for personal health she should minimize direct care exposure. 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 She verbalized understanding and will contact office for further concerns or questions.

## 2019-01-05 ENCOUNTER — Encounter: Payer: Self-pay | Admitting: Hematology

## 2019-01-08 ENCOUNTER — Encounter: Payer: Self-pay | Admitting: *Deleted

## 2019-01-08 NOTE — Progress Notes (Signed)
HEMATOLOGY/ONCOLOGY CLINIC NOTE  Date of Service: 01/09/19    Patient Care Team: Hulan Fess, MD as PCP - General (Family Medicine)  CHIEF COMPLAINTS/PURPOSE OF CONSULTATION:  F/u for mx of SMZL  HISTORY OF PRESENTING ILLNESS:   Emily Foley is a wonderful 63 y.o. 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 She is accompanied today by three members of her family. The pt reports that she is doing well overall.   The pt notes that on March 6 she developed a low-grade fever and continues to have a fever. She has kept her fever at [place] with Tylenol every 6 hours. She notes that today she has felt the least feverish since March 6.  She saw her PCP Dr. Hulan Fess who ruled out influenza and UTI. She has also seen rheumatology over the last couple weeks and had a rheumatological workup that did not show overt evidence of a primary autoimmune condition. She was noted to be neg for ANA, RF and CCP Ab. Interesting was noted to have low C4 levels.  She notes that her resting HR has been elevated and describes her heart rate as tachycardic. She had an ECHO on 12/26/17 which showed a normal EF, and was then referred to see infectious disease: CMV and TB Quantiferon were negative.  She notes that the ID team told her there was no clear evidence of an infectious process. TTE no vegetation. BL Bcx neg.  She notes that last week she developed a cough, but believes it to be allergy related. She notes that she has had an enlarged spleen since at least October 2018 revealed by a 07/31/17 US Abdomen. She notes some abdominal discomfort, and has felt that she gets full quickly after eating.  In the last year she intentionally dropped about 20 lbs of weight with change in her diet and avoiding most carbs. She notes that in the last 3 weeks, since she began feeling ill this month, she has lost about 6-7 lbs.    She also notes that she has been having night sweats that have returned this month after a few months of night sweats last year which abated in August 2018. She notes no other signs of recent infection, and denies any travel outside of the country. She denies any concern of insect bites.   She notes that in [place] she was on iron pills for low Hgb, which increased her Hgb. She stopped taking after her Hgb normalized.  She notes that in March 2018 she had a bad stomach bug and in December 2017 she had influenza.  She notes that she has been under a lot of stress with the deaths of her mother and step-father in a couple weeks of one another.   The pt reports that she has seen rheumatology twice for her hand, knee and hip pain but was confirmed to have osteoarthritis as opposed to rheumatoid arthritis. She notes that the severity of her joint pains mirror the content of her diet. She notes that her joint have never been red or swollen.   She notes that she will be having an iron infusion on 01/03/18.  Of note prior to the patient's visit today, pt has had BM Bx completed on 12/27/17 with results revealing HYPERCELLULAR BONE MARROW FOR AGE WITH NUMEROUS ATYPICAL LYMPHOID AGGREGATES. - TRILINEAGE HEMATOPOIESIS. On 12/27/17 the patient's flow cytometry revealed A MINOR MONOCLONAL  B-CELL POPULATION IDENTIFIED.  Flow cytometric analysis shows a minor population of monoclonal B cells (17% of all lymphocytes) expressing pan B-cell antigens including CD20 with lack of CD5, CD10, CD25 or CD103 expression. Immunohistochemical stains were also performed and show that the lymphoid aggregates show a mixture of T and B cells with slight predominance of T cells. No significant cyclin D1, CD34, or TdT positivity is identified. The overall findings are limited but slightly favor involvement by a B-cell lymphoproliferative process. Consideration was given to marginal zone lymphoma and splenic lymphoma  especially in the presence of splenomegaly. Nonetheless, additional material (ie lymph node or tissue biopsy) is strongly recommended for further evaluation.  I discussed the BM Bx results with Dr Gari Crown -- overall BM Bx + clinical picture certainly concerning for SMZL with possible coombs neg AIHA and possible autoimmune phenomenon related to [place]?Marland Kitchen  Most recent lab results (12/30/17) of CBC  is as follows: all values are WNL except for WBC at 3.5k, RBC at 3.18, Hgb at 7.6, HCT at 24.4, MCV at 76.7, MCH at 23.9, MCHC at 31.1, RDW at 17.0. LDH 12/30/17 was at 616. Haptoglobin<10 -- suggestive of hemolysis. C-reactive protein 09/10/17 was elevated at 20.5.   On review of systems, pt reports low-grade fever, night sweats, some fatigue, tachycardia, occasional lightheadedness, and denies dizziness, weakness, noticing any enlarged lymph nodes, changes in bowel habits, abdominal pains, abnormal vaginal discharge, and any other symptoms.   On PMHx the pt reports high blood pressure and degenerative arthritis. On Family Hx the pt reports her father had rheumatoid arthritis, her mother had kidney disease.  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 The patient's last visit with [place] was on 11/10/18. The pt reports that she is doing well overall.   The pt reports that she is working from home now and has had stable energy levels. She notes that she has been eating well and has gained some weight.  The pt notes that she has not developed any new concerns in the interim and denies fevers, chills, night sweats, and concerns for infection.   The pt presented today for her fifth maintenance Rituxan infusion, however, given the concerns of community exposure to [place], the pt would prefer to hold her infusion due today in an effort to not suppress her immune system in any way. We discussed the known risks vs benefits of  holding vs continuing maintenance Rituxan.  Lab results today (01/09/19) of CBC w/diff and CMP is as follows: all values are WNL except for CO2 at 21, Glucose at 112, BUN at 26. 01/09/19 LDH at 231  On review of systems, pt reports good energy levels, eating well, weight gain, and denies fevers, chills, night sweats, unexpected weight loss, new joint pains, abdominal pains, concerns for infections, and any other symptoms.    MEDICAL HISTORY:  Past Medical History:  Diagnosis Date  . Arthritis   . Hypertension     SURGICAL HISTORY: No past surgical history on file.  SOCIAL HISTORY: Social History   Socioeconomic History  . Marital status: Single    Spouse name: Not on file  . Number of children: Not on file  . Years of education: Not on file  . Highest education level: Not on file  Occupational History  . Not on file  Social Needs  . Financial resource strain: Not on file  . Food insecurity:    Worry: Not  on file    Inability: Not on file  . Transportation needs:    Medical: Not on file    Non-medical: Not on file  Tobacco Use  . Smoking status: Never Smoker  . Smokeless tobacco: Never Used  Substance and Sexual Activity  . Alcohol use: Not Currently    Alcohol/week: 0.0 standard drinks  . Drug use: Never  . Sexual activity: Not on file  Lifestyle  . Physical activity:    Days per week: Not on file    Minutes per session: Not on file  . Stress: Not on file  Relationships  . Social connections:    Talks on phone: Not on file    Gets together: Not on file    Attends religious service: Not on file    Active member of club or organization: Not on file    Attends meetings of clubs or organizations: Not on file    Relationship status: Not on file  . Intimate partner violence:    Fear of current or ex partner: Not on file    Emotionally abused: Not on file    Physically abused: Not on file    Forced sexual activity: Not on file  Other Topics Concern  . Not on file   Social History Narrative  . Not on file    FAMILY HISTORY: Family History  Problem Relation Age of Onset  . Diabetes Mother   . Hyperlipidemia Mother   . Hypertension Mother     ALLERGIES:  is allergic to biaxin [clarithromycin].  MEDICATIONS:  Current Outpatient Medications  Medication Sig Dispense Refill  . folic acid (FOLVITE) 166 MCG tablet Take 800 mcg by mouth daily.     . vitamin B-12 (CYANOCOBALAMIN) 1000 MCG tablet Take 1,000 mcg by mouth daily.     No current facility-administered medications for this visit.     REVIEW OF SYSTEMS:    A 10+ POINT REVIEW OF SYSTEMS WAS OBTAINED including neurology, dermatology, psychiatry, cardiac, respiratory, lymph, extremities, GI, GU, Musculoskeletal, constitutional, breasts, reproductive, HEENT.  All pertinent positives are noted in the HPI.  All others are negative.    PHYSICAL EXAMINATION: ECOG PERFORMANCE STATUS: 1 - Symptomatic but completely ambulatory  Vitals:   01/09/19 0829  BP: 140/68  Pulse: 92  Resp: 18  Temp: 98.3 F (36.8 C)  SpO2: 98%   Filed Weights   01/09/19 0829  Weight: 144 lb (65.3 kg)   .Body mass index is 25.92 kg/m.  GENERAL:alert, in no acute distress and comfortable SKIN: no acute rashes, no significant lesions EYES: conjunctiva are pink and non-injected, sclera anicteric OROPHARYNX: MMM, no exudates, no oropharyngeal erythema or ulceration NECK: supple, no JVD LYMPH:  no palpable lymphadenopathy in the cervical, axillary or inguinal regions LUNGS: clear to auscultation b/l with normal respiratory effort HEART: regular rate & rhythm ABDOMEN:  normoactive bowel sounds , non tender, not distended. No palpable hepatosplenomegaly.  Extremity: no pedal edema PSYCH: alert & oriented x 3 with fluent speech NEURO: no focal motor/sensory deficits   LABORATORY DATA:  I have reviewed the data as listed  . CBC Latest Ref Rng & Units 01/09/2019 11/10/2018 09/11/2018  WBC 4.0 - 10.5 K/uL 5.5 4.5 4.1   Hemoglobin 12.0 - 15.0 g/dL 14.7 14.8 14.7  Hematocrit 36.0 - 46.0 % 42.4 43.4 42.1  Platelets 150 - 400 K/uL 192 186 187   . CBC    Component Value Date/Time   WBC 5.5 01/09/2019 0809   RBC 4.90 01/09/2019  0809   HGB 14.7 01/09/2019 0809   HGB 14.5 07/03/2018 0835   HGB 12.8 09/10/2017 0801   HCT 42.4 01/09/2019 0809   HCT 39.4 09/10/2017 0801   PLT 192 01/09/2019 0809   PLT 183 07/03/2018 0835   PLT 144 (L) 09/10/2017 0801   MCV 86.5 01/09/2019 0809   MCV 79.0 (L) 09/10/2017 0801   MCH 30.0 01/09/2019 0809   MCHC 34.7 01/09/2019 0809   RDW 12.9 01/09/2019 0809   RDW 15.6 (H) 09/10/2017 0801   LYMPHSABS 1.3 01/09/2019 0809   LYMPHSABS 0.9 09/10/2017 0801   MONOABS 0.6 01/09/2019 0809   MONOABS 0.4 09/10/2017 0801   EOSABS 0.1 01/09/2019 0809   EOSABS 0.1 09/10/2017 0801   BASOSABS 0.0 01/09/2019 0809   BASOSABS 0.0 09/10/2017 0801    . CMP Latest Ref Rng & Units 01/09/2019 11/10/2018 09/11/2018  Glucose 70 - 99 mg/dL 112(H) 115(H) 105(H)  BUN 8 - 23 mg/dL 26(H) 23 22  Creatinine 0.44 - 1.00 mg/dL 0.83 0.81 0.80  Sodium 135 - 145 mmol/L 140 143 140  Potassium 3.5 - 5.1 mmol/L 4.1 4.1 4.2  Chloride 98 - 111 mmol/L 108 108 109  CO2 22 - 32 mmol/L 21(L) 26 22  Calcium 8.9 - 10.3 mg/dL 9.2 9.4 9.4  Total Protein 6.5 - 8.1 g/dL 6.7 6.7 6.5  Total Bilirubin 0.3 - 1.2 mg/dL 0.7 0.9 0.9  Alkaline Phos 38 - 126 U/L 83 71 68  AST 15 - 41 U/L 20 20 20   ALT 0 - 44 U/L 31 24 26    . Lab Results  Component Value Date   LDH 231 (H) 01/09/2019     12/27/17 BM Bx:   12/27/17 Flow Cytometry:     RADIOGRAPHIC STUDIES: I have personally reviewed the radiological images as listed and agreed with the findings in the report. No results found.  ASSESSMENT & PLAN:   63 y.o. female with  1. S/p ancytopenia (resolving) due to recently diagnosed Splenic marginal zone lymphoma. Patient appeared to be have significant constitutional symptoms . No evidence of rheumatological or  infectious etiology for her fevers and night sweats. Constitutional symptoms now resolved.  2. S/p Splenomegaly - likely due to [place]. Marland Kitchenimproved on [place] abd. Clinical no palpable splenomegaly today.   04/18/18 US Abdomen revealed Splenomegaly with a length of 13.3 cm and a volume of 432 cc. By report, the spleen measured up to 15.1 cm in maximum dimension on the PET-CT from January 08, 2018 suggesting interval improvement.   3. Anemia - with elevated LDH and low haptoglobin concerning for hemolysis. Coombs neg . Could be IgA driven Coombs neg AIHA. +ve reticulocytosis and Smear with some microspherocytes. No over urine hemosiderinuria. Partly anemia could also be from hypersplenism.  hgb normal now at 14.1  4. Low C4 ? Immune complex disease/autoimmunity related to lymphoma. Per rheumatology - no evidence of primary rheumatological disorder. No overt evidence of endocarditis. C3 - WNL C4 improving from <2 to 7 to 11. c3 wnl at 108 5. Fh/o evers/chills/night sweat -- likely constitutional symptoms related to ?lymphoma - now resolved. 6. Thrombocytopenia likely from splenomegaly vs lymphoma-- resolved. PLT resolved to 187k on 09/11/18 labs  PLAN:  -Discussed pt labwork today, 01/09/19; blood counts continue to be normal, chemistries are stable. LDH normal at 231. -The pt shows no clinical or lab progression/return of her Splenic Marginal Zone Lymphoma at this time. -Discussed the risks vs benefits of continuing maintenance Rituxan, regarding an element of immune  suppression related to Rituxan's use. -The pt notes that she understands the possible benefits and risks of holding maintenance Rituxan today, and would like to hold Rituxan today in view of the [place] pandemic, which is not unreasonable. -The pt and I will continue to watch for constitutional symptoms -Continue Vitamin B complex and Folic Acid supplement to support accelerated hematopoiesis -Will see the pt back in 2 months   Follow up per  scheduled appointments on 03/10/19 with labs, MD visit, and maintenance Rituxan   All of the  patients questions were answered with apparent satisfaction. The patient knows to call the clinic with any problems, questions or concerns.  The total time spent in the appt was 25 minutes and more than 50% was on counseling and direct patient cares.     Sullivan Lone MD MS AAHIVMS [facility] Hematology/Oncology Physician [facility]  (Office):       [phone] (Work cell):  [phone] (Fax):           [phone]  01/09/2019 9:47 AM  I, Baldwin [place], am acting as a scribe for Dr. Sullivan Lone.   .I have reviewed the above documentation for accuracy and completeness, and I agree with the above. Brunetta Genera MD

## 2019-01-09 ENCOUNTER — Inpatient Hospital Stay: Payer: 59

## 2019-01-09 ENCOUNTER — Other Ambulatory Visit: Payer: Self-pay

## 2019-01-09 ENCOUNTER — Telehealth: Payer: Self-pay | Admitting: Hematology

## 2019-01-09 ENCOUNTER — Inpatient Hospital Stay: Payer: 59 | Attending: Hematology and Oncology | Admitting: Hematology

## 2019-01-09 VITALS — BP 140/68 | HR 92 | Temp 98.3°F | Resp 18 | Ht 62.5 in | Wt 144.0 lb

## 2019-01-09 DIAGNOSIS — D6959 Other secondary thrombocytopenia: Secondary | ICD-10-CM | POA: Diagnosis not present

## 2019-01-09 DIAGNOSIS — R161 Splenomegaly, not elsewhere classified: Secondary | ICD-10-CM | POA: Diagnosis not present

## 2019-01-09 DIAGNOSIS — D649 Anemia, unspecified: Secondary | ICD-10-CM | POA: Diagnosis not present

## 2019-01-09 DIAGNOSIS — C8307 Small cell B-cell lymphoma, spleen: Secondary | ICD-10-CM | POA: Diagnosis not present

## 2019-01-09 DIAGNOSIS — Z5112 Encounter for antineoplastic immunotherapy: Secondary | ICD-10-CM

## 2019-01-09 LAB — CBC WITH DIFFERENTIAL/PLATELET
Abs Immature Granulocytes: 0.02 10*3/uL (ref 0.00–0.07)
Basophils Absolute: 0 10*3/uL (ref 0.0–0.1)
Basophils Relative: 1 %
Eosinophils Absolute: 0.1 10*3/uL (ref 0.0–0.5)
Eosinophils Relative: 2 %
HCT: 42.4 % (ref 36.0–46.0)
Hemoglobin: 14.7 g/dL (ref 12.0–15.0)
Immature Granulocytes: 0 %
Lymphocytes Relative: 24 %
Lymphs Abs: 1.3 10*3/uL (ref 0.7–4.0)
MCH: 30 pg (ref 26.0–34.0)
MCHC: 34.7 g/dL (ref 30.0–36.0)
MCV: 86.5 fL (ref 80.0–100.0)
Monocytes Absolute: 0.6 10*3/uL (ref 0.1–1.0)
Monocytes Relative: 10 %
Neutro Abs: 3.5 10*3/uL (ref 1.7–7.7)
Neutrophils Relative %: 63 %
Platelets: 192 10*3/uL (ref 150–400)
RBC: 4.9 MIL/uL (ref 3.87–5.11)
RDW: 12.9 % (ref 11.5–15.5)
WBC: 5.5 10*3/uL (ref 4.0–10.5)
nRBC: 0 % (ref 0.0–0.2)

## 2019-01-09 LAB — CMP (CANCER CENTER ONLY)
ALT: 31 U/L (ref 0–44)
AST: 20 U/L (ref 15–41)
Albumin: 4.5 g/dL (ref 3.5–5.0)
Alkaline Phosphatase: 83 U/L (ref 38–126)
Anion gap: 11 (ref 5–15)
BUN: 26 mg/dL — ABNORMAL HIGH (ref 8–23)
CO2: 21 mmol/L — ABNORMAL LOW (ref 22–32)
Calcium: 9.2 mg/dL (ref 8.9–10.3)
Chloride: 108 mmol/L (ref 98–111)
Creatinine: 0.83 mg/dL (ref 0.44–1.00)
GFR, Est AFR Am: >60 mL/min (ref >60)
GFR, Estimated: >60 mL/min (ref >60)
Glucose, Bld: 112 mg/dL — ABNORMAL HIGH (ref 70–99)
Potassium: 4.1 mmol/L (ref 3.5–5.1)
Sodium: 140 mmol/L (ref 135–145)
Total Bilirubin: 0.7 mg/dL (ref 0.3–1.2)
Total Protein: 6.7 g/dL (ref 6.5–8.1)

## 2019-01-09 LAB — LACTATE DEHYDROGENASE: LDH: 231 U/L — ABNORMAL HIGH (ref 98–192)

## 2019-01-09 NOTE — Telephone Encounter (Signed)
Per 4/3 los, appts already scheduled.

## 2019-03-09 NOTE — Progress Notes (Signed)
HEMATOLOGY/ONCOLOGY CLINIC NOTE  Date of Service: 03/10/19    Patient Care Team: Hulan Fess, MD as PCP - General (Family Medicine)  CHIEF COMPLAINTS/PURPOSE OF CONSULTATION:  F/u for mx of SMZL  HISTORY OF PRESENTING ILLNESS:   Emily Foley is a wonderful 63 y.o. 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 She is accompanied today by three members of her family. The pt reports that she is doing well overall.   The pt notes that on March 6 she developed a low-grade fever and continues to have a fever. She has kept her fever at [place] with Tylenol every 6 hours. She notes that today she has felt the least feverish since March 6.  She saw her PCP Dr. Hulan Fess who ruled out influenza and UTI. She has also seen rheumatology over the last couple weeks and had a rheumatological workup that did not show overt evidence of a primary autoimmune condition. She was noted to be neg for ANA, RF and CCP Ab. Interesting was noted to have low C4 levels.  She notes that her resting HR has been elevated and describes her heart rate as tachycardic. She had an ECHO on 12/26/17 which showed a normal EF, and was then referred to see infectious disease: CMV and TB Quantiferon were negative.  She notes that the ID team told her there was no clear evidence of an infectious process. TTE no vegetation. BL Bcx neg.  She notes that last week she developed a cough, but believes it to be allergy related. She notes that she has had an enlarged spleen since at least October 2018 revealed by a 07/31/17 US Abdomen. She notes some abdominal discomfort, and has felt that she gets full quickly after eating.  In the last year she intentionally dropped about 20 lbs of weight with change in her diet and avoiding most carbs. She notes that in the last 3 weeks, since she began feeling ill this month, she has lost about 6-7 lbs.    She also notes that she has been having night sweats that have returned this month after a few months of night sweats last year which abated in August 2018. She notes no other signs of recent infection, and denies any travel outside of the country. She denies any concern of insect bites.   She notes that in [place] she was on iron pills for low Hgb, which increased her Hgb. She stopped taking after her Hgb normalized.  She notes that in March 2018 she had a bad stomach bug and in December 2017 she had influenza.  She notes that she has been under a lot of stress with the deaths of her mother and step-father in a couple weeks of one another.   The pt reports that she has seen rheumatology twice for her hand, knee and hip pain but was confirmed to have osteoarthritis as opposed to rheumatoid arthritis. She notes that the severity of her joint pains mirror the content of her diet. She notes that her joint have never been red or swollen.   She notes that she will be having an iron infusion on 01/03/18.  Of note prior to the patient's visit today, pt has had BM Bx completed on 12/27/17 with results revealing HYPERCELLULAR BONE MARROW FOR AGE WITH NUMEROUS ATYPICAL LYMPHOID AGGREGATES. - TRILINEAGE HEMATOPOIESIS. On 12/27/17 the patient's flow cytometry revealed A MINOR MONOCLONAL  B-CELL POPULATION IDENTIFIED.  Flow cytometric analysis shows a minor population of monoclonal B cells (17% of all lymphocytes) expressing pan B-cell antigens including CD20 with lack of CD5, CD10, CD25 or CD103 expression. Immunohistochemical stains were also performed and show that the lymphoid aggregates show a mixture of T and B cells with slight predominance of T cells. No significant cyclin D1, CD34, or TdT positivity is identified. The overall findings are limited but slightly favor involvement by a B-cell lymphoproliferative process. Consideration was given to marginal zone lymphoma and splenic lymphoma  especially in the presence of splenomegaly. Nonetheless, additional material (ie lymph node or tissue biopsy) is strongly recommended for further evaluation.  I discussed the BM Bx results with Dr Gari Crown -- overall BM Bx + clinical picture certainly concerning for SMZL with possible coombs neg AIHA and possible autoimmune phenomenon related to [place]?Marland Kitchen  Most recent lab results (12/30/17) of CBC  is as follows: all values are WNL except for WBC at 3.5k, RBC at 3.18, Hgb at 7.6, HCT at 24.4, MCV at 76.7, MCH at 23.9, MCHC at 31.1, RDW at 17.0. LDH 12/30/17 was at 616. Haptoglobin<10 -- suggestive of hemolysis. C-reactive protein 09/10/17 was elevated at 20.5.   On review of systems, pt reports low-grade fever, night sweats, some fatigue, tachycardia, occasional lightheadedness, and denies dizziness, weakness, noticing any enlarged lymph nodes, changes in bowel habits, abdominal pains, abnormal vaginal discharge, and any other symptoms.   On PMHx the pt reports high blood pressure and degenerative arthritis. On Family Hx the pt reports her father had rheumatoid arthritis, her mother had kidney disease.  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 The patient's last visit with [place] was on 01/09/19. The pt reports that she is doing well overall.  The pt reports that she has not developed any new concerns in the interim. She notes that she has been eating well, is enjoying good energy levels and has gained a little weight. She denies fevers, chills, night sweats or unexpected weight loss. The pt notes that she has been sheltering in place and denies any concern for infections at this time.  Lab results today (03/10/19) of CBC w/diff is as follows: all values are WNL except for RBC at 5.22, HGB at 15.3. 03/10/19 LDH is 215 and CMP is WNL.  On review of systems, pt reports good energy levels, eating well, mild weight  gain, and denies fevers, chills, night sweats, unexpected weight loss, concerns for infections, noticing any new lumps or bumps, new back pains, new joint pains, and any other symptoms.    MEDICAL HISTORY:  Past Medical History:  Diagnosis Date  . Arthritis   . Hypertension     SURGICAL HISTORY: No past surgical history on file.  SOCIAL HISTORY: Social History   Socioeconomic History  . Marital status: Single    Spouse name: Not on file  . Number of children: Not on file  . Years of education: Not on file  . Highest education level: Not on file  Occupational History  . Not on file  Social Needs  . Financial resource strain: Not on file  . Food insecurity:    Worry: Not on file    Inability: Not on file  . Transportation needs:    Medical: Not on file    Non-medical: Not on file  Tobacco Use  . Smoking status: Never Smoker  . Smokeless tobacco: Never Used  Substance and Sexual Activity  . Alcohol use: Not Currently    Alcohol/week: 0.0 standard drinks  . Drug use: Never  . Sexual activity: Not on file  Lifestyle  . Physical activity:    Days per week: Not on file    Minutes per session: Not on file  . Stress: Not on file  Relationships  . Social connections:    Talks on phone: Not on file    Gets together: Not on file    Attends religious service: Not on file    Active member of club or organization: Not on file    Attends meetings of clubs or organizations: Not on file    Relationship status: Not on file  . Intimate partner violence:    Fear of current or ex partner: Not on file    Emotionally abused: Not on file    Physically abused: Not on file    Forced sexual activity: Not on file  Other Topics Concern  . Not on file  Social History Narrative  . Not on file    FAMILY HISTORY: Family History  Problem Relation Age of Onset  . Diabetes Mother   . Hyperlipidemia Mother   . Hypertension Mother     ALLERGIES:  is allergic to biaxin  [clarithromycin].  MEDICATIONS:  Current Outpatient Medications  Medication Sig Dispense Refill  . folic acid (FOLVITE) 676 MCG tablet Take 800 mcg by mouth daily.     . vitamin B-12 (CYANOCOBALAMIN) 1000 MCG tablet Take 1,000 mcg by mouth daily.     No current facility-administered medications for this visit.     REVIEW OF SYSTEMS:    A 10+ POINT REVIEW OF SYSTEMS WAS OBTAINED including neurology, dermatology, psychiatry, cardiac, respiratory, lymph, extremities, GI, GU, Musculoskeletal, constitutional, breasts, reproductive, HEENT.  All pertinent positives are noted in the HPI.  All others are negative.    PHYSICAL EXAMINATION: ECOG PERFORMANCE STATUS: 1 - Symptomatic but completely ambulatory  Vitals:   03/10/19 0917  BP: (!) 161/76  Pulse: 80  Resp: 18  Temp: 98.5 F (36.9 C)  SpO2: 98%   Filed Weights   03/10/19 0917  Weight: 143 lb 11.2 oz (65.2 kg)   .Body mass index is 25.86 kg/m.  GENERAL:alert, in no acute distress and comfortable SKIN: no acute rashes, no significant lesions EYES: conjunctiva are pink and non-injected, sclera anicteric OROPHARYNX: MMM, no exudates, no oropharyngeal erythema or ulceration NECK: supple, no JVD LYMPH:  no palpable lymphadenopathy in the cervical, axillary or inguinal regions LUNGS: clear to auscultation b/l with normal respiratory effort HEART: regular rate & rhythm ABDOMEN:  normoactive bowel sounds , non tender, not distended. No palpable hepatosplenomegaly.  Extremity: no pedal edema PSYCH: alert & oriented x 3 with fluent speech NEURO: no focal motor/sensory deficits   LABORATORY DATA:  I have reviewed the data as listed  . CBC Latest Ref Rng & Units 03/10/2019 01/09/2019 11/10/2018  WBC 4.0 - 10.5 K/uL 5.6 5.5 4.5  Hemoglobin 12.0 - 15.0 g/dL 15.3(H) 14.7 14.8  Hematocrit 36.0 - 46.0 % 45.5 42.4 43.4  Platelets 150 - 400 K/uL 212 192 186   . CBC    Component Value Date/Time   WBC 5.6 03/10/2019 0855   RBC 5.22 (H)  03/10/2019 0855   HGB 15.3 (H) 03/10/2019 0855   HGB 14.5 07/03/2018 0835   HGB 12.8 09/10/2017 0801   HCT 45.5 03/10/2019 0855   HCT 39.4 09/10/2017 0801   PLT 212 03/10/2019 0855  PLT 183 07/03/2018 0835   PLT 144 (L) 09/10/2017 0801   MCV 87.2 03/10/2019 0855   MCV 79.0 (L) 09/10/2017 0801   MCH 29.3 03/10/2019 0855   MCHC 33.6 03/10/2019 0855   RDW 12.8 03/10/2019 0855   RDW 15.6 (H) 09/10/2017 0801   LYMPHSABS 1.1 03/10/2019 0855   LYMPHSABS 0.9 09/10/2017 0801   MONOABS 0.5 03/10/2019 0855   MONOABS 0.4 09/10/2017 0801   EOSABS 0.2 03/10/2019 0855   EOSABS 0.1 09/10/2017 0801   BASOSABS 0.0 03/10/2019 0855   BASOSABS 0.0 09/10/2017 0801    . CMP Latest Ref Rng & Units 03/10/2019 01/09/2019 11/10/2018  Glucose 70 - 99 mg/dL 121(H) 112(H) 115(H)  BUN 8 - 23 mg/dL 17 26(H) 23  Creatinine 0.44 - 1.00 mg/dL 0.82 0.83 0.81  Sodium 135 - 145 mmol/L 142 140 143  Potassium 3.5 - 5.1 mmol/L 4.1 4.1 4.1  Chloride 98 - 111 mmol/L 107 108 108  CO2 22 - 32 mmol/L 25 21(L) 26  Calcium 8.9 - 10.3 mg/dL 9.1 9.2 9.4  Total Protein 6.5 - 8.1 g/dL 6.8 6.7 6.7  Total Bilirubin 0.3 - 1.2 mg/dL 0.8 0.7 0.9  Alkaline Phos 38 - 126 U/L 71 83 71  AST 15 - 41 U/L 21 20 20   ALT 0 - 44 U/L 41 31 24   . Lab Results  Component Value Date   LDH 215 (H) 03/10/2019     12/27/17 BM Bx:   12/27/17 Flow Cytometry:     RADIOGRAPHIC STUDIES: I have personally reviewed the radiological images as listed and agreed with the findings in the report. No results found.  ASSESSMENT & PLAN:   63 y.o. female with  1. S/p ancytopenia (resolving) due to recently diagnosed Splenic marginal zone lymphoma. Patient appeared to be have significant constitutional symptoms . No evidence of rheumatological or infectious etiology for her fevers and night sweats. Constitutional symptoms now resolved.  2. S/p Splenomegaly - likely due to [place]. Marland Kitchenimproved on [place] abd. Clinical no palpable splenomegaly today.    04/18/18 US Abdomen revealed Splenomegaly with a length of 13.3 cm and a volume of 432 cc. By report, the spleen measured up to 15.1 cm in maximum dimension on the PET-CT from January 08, 2018 suggesting interval improvement.   3. Anemia - with elevated LDH and low haptoglobin concerning for hemolysis. Coombs neg . Could be IgA driven Coombs neg AIHA. +ve reticulocytosis and Smear with some microspherocytes. No over urine hemosiderinuria. Partly anemia could also be from hypersplenism.  hgb normal now at 14.1  4. Low C4 ? Immune complex disease/autoimmunity related to lymphoma. Per rheumatology - no evidence of primary rheumatological disorder. No overt evidence of endocarditis. C3 - WNL C4 improving from <2 to 7 to 11. c3 wnl at 108 5. h/o Fevers/chills/night sweat -- likely constitutional symptoms related to ?lymphoma - now resolved. 6. Thrombocytopenia likely from splenomegaly vs lymphoma-- resolved. PLT resolved to 187k on 09/11/18 labs  PLAN:  -Discussed pt labwork today, 03/10/19; blood counts are stable -The pt shows no clinical or lab progression of her Splenic marginal zone lymphoma at this time.  -Pt held fifth cycle at last visit in April due to Covid-19 pandemic, which was not unreasonable.  -Discussed the option to either pursue maintenance Rituxan today vs holding in light of pandemic given some element of immune suppression. Discussed risks vs benefits of both choices. Pt would like to proceed with treatment today. -The pt has no prohibitive toxicities from  continuing her fifth cycle of maintenance Rituxan at this time. -Rituxan orders placed and reviewed. -The pt and I will continue to watch for constitutional symptoms -Continue Vitamin B complex and Folic Acid supplement to support accelerated hematopoiesis -Will see the pt back in 2 months   Please schedule next 3 doses of maintenance Rituxan RTC with Dr Irene Limbo with labs with each dose of maintenance Rituxan   All of the   patients questions were answered with apparent satisfaction. The patient knows to call the clinic with any problems, questions or concerns.  The total time spent in the appt was 25 minutes and more than 50% was on counseling and direct patient cares.    Sullivan Lone MD MS AAHIVMS [facility] Hematology/Oncology Physician [facility]  (Office):       [phone] (Work cell):  [phone] (Fax):           [phone]  03/10/2019 9:47 AM  I, Baldwin [place], am acting as a scribe for Dr. Sullivan Lone.   .I have reviewed the above documentation for accuracy and completeness, and I agree with the above. Brunetta Genera MD

## 2019-03-10 ENCOUNTER — Other Ambulatory Visit: Payer: Self-pay

## 2019-03-10 ENCOUNTER — Inpatient Hospital Stay: Payer: 59

## 2019-03-10 ENCOUNTER — Inpatient Hospital Stay: Payer: 59 | Attending: Hematology and Oncology | Admitting: Hematology

## 2019-03-10 VITALS — BP 147/80 | HR 83 | Temp 98.8°F | Resp 16

## 2019-03-10 VITALS — BP 161/76 | HR 80 | Temp 98.5°F | Resp 18 | Ht 62.5 in | Wt 143.7 lb

## 2019-03-10 DIAGNOSIS — Z5112 Encounter for antineoplastic immunotherapy: Secondary | ICD-10-CM | POA: Insufficient documentation

## 2019-03-10 DIAGNOSIS — D649 Anemia, unspecified: Secondary | ICD-10-CM | POA: Insufficient documentation

## 2019-03-10 DIAGNOSIS — C8307 Small cell B-cell lymphoma, spleen: Secondary | ICD-10-CM | POA: Insufficient documentation

## 2019-03-10 DIAGNOSIS — D696 Thrombocytopenia, unspecified: Secondary | ICD-10-CM | POA: Insufficient documentation

## 2019-03-10 DIAGNOSIS — Z7189 Other specified counseling: Secondary | ICD-10-CM

## 2019-03-10 LAB — CBC WITH DIFFERENTIAL/PLATELET
Abs Immature Granulocytes: 0.01 10*3/uL (ref 0.00–0.07)
Basophils Absolute: 0 10*3/uL (ref 0.0–0.1)
Basophils Relative: 1 %
Eosinophils Absolute: 0.2 10*3/uL (ref 0.0–0.5)
Eosinophils Relative: 3 %
HCT: 45.5 % (ref 36.0–46.0)
Hemoglobin: 15.3 g/dL — ABNORMAL HIGH (ref 12.0–15.0)
Immature Granulocytes: 0 %
Lymphocytes Relative: 20 %
Lymphs Abs: 1.1 10*3/uL (ref 0.7–4.0)
MCH: 29.3 pg (ref 26.0–34.0)
MCHC: 33.6 g/dL (ref 30.0–36.0)
MCV: 87.2 fL (ref 80.0–100.0)
Monocytes Absolute: 0.5 10*3/uL (ref 0.1–1.0)
Monocytes Relative: 9 %
Neutro Abs: 3.8 10*3/uL (ref 1.7–7.7)
Neutrophils Relative %: 67 %
Platelets: 212 10*3/uL (ref 150–400)
RBC: 5.22 MIL/uL — ABNORMAL HIGH (ref 3.87–5.11)
RDW: 12.8 % (ref 11.5–15.5)
WBC: 5.6 10*3/uL (ref 4.0–10.5)
nRBC: 0 % (ref 0.0–0.2)

## 2019-03-10 LAB — CMP (CANCER CENTER ONLY)
ALT: 41 U/L (ref 0–44)
AST: 21 U/L (ref 15–41)
Albumin: 4.5 g/dL (ref 3.5–5.0)
Alkaline Phosphatase: 71 U/L (ref 38–126)
Anion gap: 10 (ref 5–15)
BUN: 17 mg/dL (ref 8–23)
CO2: 25 mmol/L (ref 22–32)
Calcium: 9.1 mg/dL (ref 8.9–10.3)
Chloride: 107 mmol/L (ref 98–111)
Creatinine: 0.82 mg/dL (ref 0.44–1.00)
GFR, Est AFR Am: >60 mL/min (ref >60)
GFR, Estimated: >60 mL/min (ref >60)
Glucose, Bld: 121 mg/dL — ABNORMAL HIGH (ref 70–99)
Potassium: 4.1 mmol/L (ref 3.5–5.1)
Sodium: 142 mmol/L (ref 135–145)
Total Bilirubin: 0.8 mg/dL (ref 0.3–1.2)
Total Protein: 6.8 g/dL (ref 6.5–8.1)

## 2019-03-10 LAB — LACTATE DEHYDROGENASE: LDH: 215 U/L — ABNORMAL HIGH (ref 98–192)

## 2019-03-10 MED ORDER — FAMOTIDINE 20 MG PO TABS
ORAL_TABLET | ORAL | Status: AC
  Filled 2019-03-10: qty 1

## 2019-03-10 MED ORDER — FAMOTIDINE 20 MG PO TABS
20.0000 mg | ORAL_TABLET | Freq: Once | ORAL | Status: AC
  Administered 2019-03-10: 20 mg via ORAL

## 2019-03-10 MED ORDER — DIPHENHYDRAMINE HCL 25 MG PO CAPS
50.0000 mg | ORAL_CAPSULE | Freq: Once | ORAL | Status: AC
  Administered 2019-03-10: 50 mg via ORAL

## 2019-03-10 MED ORDER — SODIUM CHLORIDE 0.9 % IV SOLN
Freq: Once | INTRAVENOUS | Status: AC
  Administered 2019-03-10: 10:00:00 via INTRAVENOUS
  Filled 2019-03-10: qty 250

## 2019-03-10 MED ORDER — DIPHENHYDRAMINE HCL 25 MG PO CAPS
ORAL_CAPSULE | ORAL | Status: AC
  Filled 2019-03-10: qty 2

## 2019-03-10 MED ORDER — SODIUM CHLORIDE 0.9 % IV SOLN
375.0000 mg/m2 | Freq: Once | INTRAVENOUS | Status: AC
  Administered 2019-03-10: 600 mg via INTRAVENOUS
  Filled 2019-03-10: qty 50

## 2019-03-10 MED ORDER — METHYLPREDNISOLONE SODIUM SUCC 125 MG IJ SOLR
INTRAMUSCULAR | Status: AC
  Filled 2019-03-10: qty 2

## 2019-03-10 MED ORDER — METHYLPREDNISOLONE SODIUM SUCC 125 MG IJ SOLR
125.0000 mg | Freq: Every day | INTRAMUSCULAR | Status: DC
  Administered 2019-03-10: 125 mg via INTRAVENOUS

## 2019-03-10 MED ORDER — ACETAMINOPHEN 325 MG PO TABS
ORAL_TABLET | ORAL | Status: AC
  Filled 2019-03-10: qty 2

## 2019-03-10 MED ORDER — ACETAMINOPHEN 325 MG PO TABS
650.0000 mg | ORAL_TABLET | Freq: Once | ORAL | Status: AC
  Administered 2019-03-10: 650 mg via ORAL

## 2019-03-10 NOTE — Patient Instructions (Signed)
[facility] Discharge Instructions for Patients Receiving Chemotherapy  Today you received the following chemotherapy agents Rituximab (RITUXAN).  To help prevent nausea and vomiting after your treatment, we encourage you to take your nausea medication as prescribed.   If you develop nausea and vomiting that is not controlled by your nausea medication, call the clinic.   BELOW ARE SYMPTOMS THAT SHOULD BE REPORTED IMMEDIATELY:  *FEVER GREATER THAN 100.5 F  *CHILLS WITH OR WITHOUT FEVER  NAUSEA AND VOMITING THAT IS NOT CONTROLLED WITH YOUR NAUSEA MEDICATION  *UNUSUAL SHORTNESS OF BREATH  *UNUSUAL BRUISING OR BLEEDING  TENDERNESS IN MOUTH AND THROAT WITH OR WITHOUT PRESENCE OF ULCERS  *URINARY PROBLEMS  *BOWEL PROBLEMS  UNUSUAL RASH Items with * indicate a potential emergency and should be followed up as soon as possible.  Feel free to call the clinic should you have any questions or concerns. The clinic phone number is [phone].  Please show the CHEMO ALERT CARD at check-in to the Emergency Department and triage nurse.   

## 2019-03-11 ENCOUNTER — Telehealth: Payer: Self-pay | Admitting: Hematology

## 2019-03-11 NOTE — Telephone Encounter (Signed)
Scheduled appt per 6/2 los.  A calendar will be mailed out 

## 2019-03-12 ENCOUNTER — Encounter: Payer: Self-pay | Admitting: Hematology

## 2019-04-17 MED FILL — AMOXICILLIN 500 MG CAPSULE: 500 | 3 days supply | Qty: 12 | Fill #0

## 2019-04-28 DIAGNOSIS — Z760 Encounter for issue of repeat prescription: Secondary | ICD-10-CM | POA: Diagnosis not present

## 2019-05-04 ENCOUNTER — Telehealth: Payer: Self-pay | Admitting: Hematology

## 2019-05-04 ENCOUNTER — Other Ambulatory Visit: Payer: Self-pay

## 2019-05-04 DIAGNOSIS — Z20822 Contact with and (suspected) exposure to covid-19: Secondary | ICD-10-CM

## 2019-05-04 NOTE — Telephone Encounter (Signed)
R/s apt per 7/27 sch message - per pt 8/7 or 8/10 - scheduled 8/10 per patient preference. Pt aware of d/t

## 2019-05-06 LAB — NOVEL CORONAVIRUS, NAA: SARS-CoV-2, NAA: NOT DETECTED

## 2019-05-11 ENCOUNTER — Ambulatory Visit: Payer: 59

## 2019-05-11 ENCOUNTER — Ambulatory Visit: Payer: 59 | Admitting: Hematology

## 2019-05-11 ENCOUNTER — Other Ambulatory Visit: Payer: 59

## 2019-05-17 NOTE — Progress Notes (Signed)
HEMATOLOGY/ONCOLOGY CLINIC NOTE  Date of Service: 05/18/19    Patient Care Team: Hulan Fess, MD as PCP - General (Family Medicine)  CHIEF COMPLAINTS/PURPOSE OF CONSULTATION:  F/u for mx of SMZL  HISTORY OF PRESENTING ILLNESS:   Emily Foley is a wonderful 63 y.o. 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 She is accompanied today by three members of her family. The pt reports that she is doing well overall.   The pt notes that on March 6 she developed a low-grade fever and continues to have a fever. She has kept her fever at [place] with Tylenol every 6 hours. She notes that today she has felt the least feverish since March 6.  She saw her PCP Dr. Hulan Fess who ruled out influenza and UTI. She has also seen rheumatology over the last couple weeks and had a rheumatological workup that did not show overt evidence of a primary autoimmune condition. She was noted to be neg for ANA, RF and CCP Ab. Interesting was noted to have low C4 levels.  She notes that her resting HR has been elevated and describes her heart rate as tachycardic. She had an ECHO on 12/26/17 which showed a normal EF, and was then referred to see infectious disease: CMV and TB Quantiferon were negative.  She notes that the ID team told her there was no clear evidence of an infectious process. TTE no vegetation. BL Bcx neg.  She notes that last week she developed a cough, but believes it to be allergy related. She notes that she has had an enlarged spleen since at least October 2018 revealed by a 07/31/17 US Abdomen. She notes some abdominal discomfort, and has felt that she gets full quickly after eating.  In the last year she intentionally dropped about 20 lbs of weight with change in her diet and avoiding most carbs. She notes that in the last 3 weeks, since she began feeling ill this month, she has lost about 6-7 lbs.    She also notes that she has been having night sweats that have returned this month after a few months of night sweats last year which abated in August 2018. She notes no other signs of recent infection, and denies any travel outside of the country. She denies any concern of insect bites.   She notes that in [place] she was on iron pills for low Hgb, which increased her Hgb. She stopped taking after her Hgb normalized.  She notes that in March 2018 she had a bad stomach bug and in December 2017 she had influenza.  She notes that she has been under a lot of stress with the deaths of her mother and step-father in a couple weeks of one another.   The pt reports that she has seen rheumatology twice for her hand, knee and hip pain but was confirmed to have osteoarthritis as opposed to rheumatoid arthritis. She notes that the severity of her joint pains mirror the content of her diet. She notes that her joint have never been red or swollen.   She notes that she will be having an iron infusion on 01/03/18.  Of note prior to the patient's visit today, pt has had BM Bx completed on 12/27/17 with results revealing HYPERCELLULAR BONE MARROW FOR AGE WITH NUMEROUS ATYPICAL LYMPHOID AGGREGATES. - TRILINEAGE HEMATOPOIESIS. On 12/27/17 the patient's flow cytometry revealed A MINOR MONOCLONAL  B-CELL POPULATION IDENTIFIED.  Flow cytometric analysis shows a minor population of monoclonal B cells (17% of all lymphocytes) expressing pan B-cell antigens including CD20 with lack of CD5, CD10, CD25 or CD103 expression. Immunohistochemical stains were also performed and show that the lymphoid aggregates show a mixture of T and B cells with slight predominance of T cells. No significant cyclin D1, CD34, or TdT positivity is identified. The overall findings are limited but slightly favor involvement by a B-cell lymphoproliferative process. Consideration was given to marginal zone lymphoma and splenic lymphoma  especially in the presence of splenomegaly. Nonetheless, additional material (ie lymph node or tissue biopsy) is strongly recommended for further evaluation.  I discussed the BM Bx results with Dr Gari Crown -- overall BM Bx + clinical picture certainly concerning for SMZL with possible coombs neg AIHA and possible autoimmune phenomenon related to [place]?Marland Kitchen  Most recent lab results (12/30/17) of CBC  is as follows: all values are WNL except for WBC at 3.5k, RBC at 3.18, Hgb at 7.6, HCT at 24.4, MCV at 76.7, MCH at 23.9, MCHC at 31.1, RDW at 17.0. LDH 12/30/17 was at 616. Haptoglobin<10 -- suggestive of hemolysis. C-reactive protein 09/10/17 was elevated at 20.5.   On review of systems, pt reports low-grade fever, night sweats, some fatigue, tachycardia, occasional lightheadedness, and denies dizziness, weakness, noticing any enlarged lymph nodes, changes in bowel habits, abdominal pains, abnormal vaginal discharge, and any other symptoms.   On PMHx the pt reports high blood pressure and degenerative arthritis. On Family Hx the pt reports her father had rheumatoid arthritis, her mother had kidney disease.  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 The patient's last visit with [place] was on 03/10/2019. The pt reports that she is doing well overall.  The pt reports no new concerns related to Rituxan. She reports joint pain and mild hot flashes, but feels that they are related to her sugar intake. She stays physically active by walking. Denies fevers, chills, and night sweats. She has some lower back pain in the mornings due to arthritis.   Lab results today (05/18/2019) of CBC w/diff and CMP is as follows: all values are WNL except for RBC at 5.14, HGB at 15.2, ALT at 63. 05/18/2019 LDH at 238  On review of systems, pt reports some hot flashes, joint pain, lower back pain due to arthritis, and denies fevers, chills, night  sweats, any other symptoms.   MEDICAL HISTORY:  Past Medical History:  Diagnosis Date  . Arthritis   . Hypertension     SURGICAL HISTORY: No past surgical history on file.  SOCIAL HISTORY: Social History   Socioeconomic History  . Marital status: Single    Spouse name: Not on file  . Number of children: Not on file  . Years of education: Not on file  . Highest education level: Not on file  Occupational History  . Not on file  Social Needs  . Financial resource strain: Not on file  . Food insecurity    Worry: Not on file    Inability: Not on file  . Transportation needs    Medical: Not on file    Non-medical: Not on file  Tobacco Use  . Smoking status: Never Smoker  . Smokeless tobacco: Never Used  Substance and Sexual Activity  . Alcohol use: Not Currently    Alcohol/week: 0.0 standard drinks  . Drug use: Never  . Sexual activity: Not  on file  Lifestyle  . Physical activity    Days per week: Not on file    Minutes per session: Not on file  . Stress: Not on file  Relationships  . Social Herbalist on phone: Not on file    Gets together: Not on file    Attends religious service: Not on file    Active member of club or organization: Not on file    Attends meetings of clubs or organizations: Not on file    Relationship status: Not on file  . Intimate partner violence    Fear of current or ex partner: Not on file    Emotionally abused: Not on file    Physically abused: Not on file    Forced sexual activity: Not on file  Other Topics Concern  . Not on file  Social History Narrative  . Not on file    FAMILY HISTORY: Family History  Problem Relation Age of Onset  . Diabetes Mother   . Hyperlipidemia Mother   . Hypertension Mother     ALLERGIES:  is allergic to biaxin [clarithromycin].  MEDICATIONS:  Current Outpatient Medications  Medication Sig Dispense Refill  . folic acid (FOLVITE) 628 MCG tablet Take 800 mcg by mouth daily.     .  vitamin B-12 (CYANOCOBALAMIN) 1000 MCG tablet Take 1,000 mcg by mouth daily.     No current facility-administered medications for this visit.     REVIEW OF SYSTEMS:    A 10+ POINT REVIEW OF SYSTEMS WAS OBTAINED including neurology, dermatology, psychiatry, cardiac, respiratory, lymph, extremities, GI, GU, Musculoskeletal, constitutional, breasts, reproductive, HEENT.  All pertinent positives are noted in the HPI.  All others are negative.    PHYSICAL EXAMINATION: ECOG PERFORMANCE STATUS: 1 - Symptomatic but completely ambulatory  Vitals:   05/18/19 1231  BP: (!) 160/81  Pulse: 76  Resp: 18  Temp: 98.7 F (37.1 C)  SpO2: 98%   Filed Weights   05/18/19 1231  Weight: 146 lb 6.4 oz (66.4 kg)   .Body mass index is 26.35 kg/m.   GENERAL:alert, in no acute distress and comfortable SKIN: no acute rashes, no significant lesions EYES: conjunctiva are pink and non-injected, sclera anicteric OROPHARYNX: MMM, no exudates, no oropharyngeal erythema or ulceration NECK: supple, no JVD LYMPH:  no palpable lymphadenopathy in the cervical, axillary or inguinal regions LUNGS: clear to auscultation b/l with normal respiratory effort HEART: regular rate & rhythm ABDOMEN:  normoactive bowel sounds , non tender, not distended. No palpable hepatosplenomegaly.  Extremity: no pedal edema PSYCH: alert & oriented x 3 with fluent speech NEURO: no focal motor/sensory deficits   LABORATORY DATA:  I have reviewed the data as listed  . CBC Latest Ref Rng & Units 05/18/2019 03/10/2019 01/09/2019  WBC 4.0 - 10.5 K/uL 5.6 5.6 5.5  Hemoglobin 12.0 - 15.0 g/dL 15.2(H) 15.3(H) 14.7  Hematocrit 36.0 - 46.0 % 44.5 45.5 42.4  Platelets 150 - 400 K/uL 215 212 192   . CBC    Component Value Date/Time   WBC 5.6 05/18/2019 1134   RBC 5.14 (H) 05/18/2019 1134   HGB 15.2 (H) 05/18/2019 1134   HGB 14.5 07/03/2018 0835   HGB 12.8 09/10/2017 0801   HCT 44.5 05/18/2019 1134   HCT 39.4 09/10/2017 0801   PLT 215  05/18/2019 1134   PLT 183 07/03/2018 0835   PLT 144 (L) 09/10/2017 0801   MCV 86.6 05/18/2019 1134   MCV 79.0 (L) 09/10/2017 0801  MCH 29.6 05/18/2019 1134   MCHC 34.2 05/18/2019 1134   RDW 12.9 05/18/2019 1134   RDW 15.6 (H) 09/10/2017 0801   LYMPHSABS 1.4 05/18/2019 1134   LYMPHSABS 0.9 09/10/2017 0801   MONOABS 0.6 05/18/2019 1134   MONOABS 0.4 09/10/2017 0801   EOSABS 0.2 05/18/2019 1134   EOSABS 0.1 09/10/2017 0801   BASOSABS 0.1 05/18/2019 1134   BASOSABS 0.0 09/10/2017 0801    . CMP Latest Ref Rng & Units 05/18/2019 03/10/2019 01/09/2019  Glucose 70 - 99 mg/dL 99 121(H) 112(H)  BUN 8 - 23 mg/dL 16 17 26(H)  Creatinine 0.44 - 1.00 mg/dL 0.82 0.82 0.83  Sodium 135 - 145 mmol/L 140 142 140  Potassium 3.5 - 5.1 mmol/L 4.1 4.1 4.1  Chloride 98 - 111 mmol/L 106 107 108  CO2 22 - 32 mmol/L 23 25 21(L)  Calcium 8.9 - 10.3 mg/dL 9.4 9.1 9.2  Total Protein 6.5 - 8.1 g/dL 6.9 6.8 6.7  Total Bilirubin 0.3 - 1.2 mg/dL 0.9 0.8 0.7  Alkaline Phos 38 - 126 U/L 71 71 83  AST 15 - 41 U/L 31 21 20   ALT 0 - 44 U/L 63(H) 41 31   . Lab Results  Component Value Date   LDH 238 (H) 05/18/2019     12/27/17 BM Bx:   12/27/17 Flow Cytometry:     RADIOGRAPHIC STUDIES: I have personally reviewed the radiological images as listed and agreed with the findings in the report. No results found.  ASSESSMENT & PLAN:   63 y.o. female with  1. S/p ancytopenia (resolving) due to recently diagnosed Splenic marginal zone lymphoma. Patient appeared to be have significant constitutional symptoms . No evidence of rheumatological or infectious etiology for her fevers and night sweats. Constitutional symptoms now resolved.  2. S/p Splenomegaly - likely due to [place]. Marland Kitchenimproved on [place] abd. Clinical no palpable splenomegaly today.   04/18/18 US Abdomen revealed Splenomegaly with a length of 13.3 cm and a volume of 432 cc. By report, the spleen measured up to 15.1 cm in maximum dimension on the PET-CT from  January 08, 2018 suggesting interval improvement.   3. Anemia - with elevated LDH and low haptoglobin concerning for hemolysis. Coombs neg . Could be IgA driven Coombs neg AIHA. +ve reticulocytosis and Smear with some microspherocytes. No over urine hemosiderinuria. Partly anemia could also be from hypersplenism.  hgb normal now at 14.1  4. Low C4 ? Immune complex disease/autoimmunity related to lymphoma. Per rheumatology - no evidence of primary rheumatological disorder. No overt evidence of endocarditis. C3 - WNL C4 improving from <2 to 7 to 11. c3 wnl at 108 5. h/o Fevers/chills/night sweat -- likely constitutional symptoms related to ?lymphoma - now resolved. 6. Thrombocytopenia likely from splenomegaly vs lymphoma-- resolved. PLT resolved to 187k on 09/11/18 labs  PLAN:  -Discussed pt labwork today, 05/18/2019; blood counts are stable, blood chemistries are normal -The pt shows no clinical or lab progression of her Splenic marginal zone lymphoma at this time.  -The pt has no prohibitive toxicities from continuing C6 of maintenance Rituxan at this time. -The pt and I will continue to watch for constitutional symptoms -Continue Vitamin B complex and Folic Acid supplement to support accelerated hematopoiesis -Per pt's request, will schedule next cycle for October 18th   Plz schedule next 2 maintenance Rituxan treatments as per orders on 10/18 and 12/18 with labs and MD visit   All of the  patients questions were answered with apparent satisfaction. The  patient knows to call the clinic with any problems, questions or concerns.  The total time spent in the appt was 25 minutes and more than 50% was on counseling and direct patient cares.   Sullivan Lone MD MS AAHIVMS [facility] Hematology/Oncology Physician [facility]  (Office):       [phone] (Work cell):  [phone] (Fax):           [phone]  05/18/2019 1:18 PM  I, De Burrs, am acting as a scribe for Dr.  Irene Limbo  .I have reviewed the above documentation for accuracy and completeness, and I agree with the above. Brunetta Genera MD

## 2019-05-18 ENCOUNTER — Inpatient Hospital Stay (HOSPITAL_BASED_OUTPATIENT_CLINIC_OR_DEPARTMENT_OTHER): Payer: 59 | Admitting: Hematology

## 2019-05-18 ENCOUNTER — Other Ambulatory Visit: Payer: Self-pay

## 2019-05-18 ENCOUNTER — Inpatient Hospital Stay: Payer: 59

## 2019-05-18 ENCOUNTER — Inpatient Hospital Stay: Payer: 59 | Attending: Hematology and Oncology

## 2019-05-18 VITALS — BP 122/89 | HR 86 | Temp 98.6°F | Resp 18

## 2019-05-18 VITALS — BP 160/81 | HR 76 | Temp 98.7°F | Resp 18 | Ht 62.5 in | Wt 146.4 lb

## 2019-05-18 DIAGNOSIS — C8307 Small cell B-cell lymphoma, spleen: Secondary | ICD-10-CM | POA: Diagnosis not present

## 2019-05-18 DIAGNOSIS — D649 Anemia, unspecified: Secondary | ICD-10-CM | POA: Diagnosis not present

## 2019-05-18 DIAGNOSIS — Z5112 Encounter for antineoplastic immunotherapy: Secondary | ICD-10-CM | POA: Diagnosis not present

## 2019-05-18 DIAGNOSIS — Z7189 Other specified counseling: Secondary | ICD-10-CM

## 2019-05-18 LAB — CMP (CANCER CENTER ONLY)
ALT: 63 U/L — ABNORMAL HIGH (ref 0–44)
AST: 31 U/L (ref 15–41)
Albumin: 4.7 g/dL (ref 3.5–5.0)
Alkaline Phosphatase: 71 U/L (ref 38–126)
Anion gap: 11 (ref 5–15)
BUN: 16 mg/dL (ref 8–23)
CO2: 23 mmol/L (ref 22–32)
Calcium: 9.4 mg/dL (ref 8.9–10.3)
Chloride: 106 mmol/L (ref 98–111)
Creatinine: 0.82 mg/dL (ref 0.44–1.00)
GFR, Est AFR Am: >60 mL/min (ref >60)
GFR, Estimated: >60 mL/min (ref >60)
Glucose, Bld: 99 mg/dL (ref 70–99)
Potassium: 4.1 mmol/L (ref 3.5–5.1)
Sodium: 140 mmol/L (ref 135–145)
Total Bilirubin: 0.9 mg/dL (ref 0.3–1.2)
Total Protein: 6.9 g/dL (ref 6.5–8.1)

## 2019-05-18 LAB — CBC WITH DIFFERENTIAL/PLATELET
Abs Immature Granulocytes: 0.02 10*3/uL (ref 0.00–0.07)
Basophils Absolute: 0.1 10*3/uL (ref 0.0–0.1)
Basophils Relative: 1 %
Eosinophils Absolute: 0.2 10*3/uL (ref 0.0–0.5)
Eosinophils Relative: 3 %
HCT: 44.5 % (ref 36.0–46.0)
Hemoglobin: 15.2 g/dL — ABNORMAL HIGH (ref 12.0–15.0)
Immature Granulocytes: 0 %
Lymphocytes Relative: 24 %
Lymphs Abs: 1.4 10*3/uL (ref 0.7–4.0)
MCH: 29.6 pg (ref 26.0–34.0)
MCHC: 34.2 g/dL (ref 30.0–36.0)
MCV: 86.6 fL (ref 80.0–100.0)
Monocytes Absolute: 0.6 10*3/uL (ref 0.1–1.0)
Monocytes Relative: 11 %
Neutro Abs: 3.4 10*3/uL (ref 1.7–7.7)
Neutrophils Relative %: 61 %
Platelets: 215 10*3/uL (ref 150–400)
RBC: 5.14 MIL/uL — ABNORMAL HIGH (ref 3.87–5.11)
RDW: 12.9 % (ref 11.5–15.5)
WBC: 5.6 10*3/uL (ref 4.0–10.5)
nRBC: 0 % (ref 0.0–0.2)

## 2019-05-18 LAB — LACTATE DEHYDROGENASE: LDH: 238 U/L — ABNORMAL HIGH (ref 98–192)

## 2019-05-18 MED ORDER — SODIUM CHLORIDE 0.9 % IV SOLN
375.0000 mg/m2 | Freq: Once | INTRAVENOUS | Status: AC
  Administered 2019-05-18: 15:00:00 600 mg via INTRAVENOUS
  Filled 2019-05-18: qty 50

## 2019-05-18 MED ORDER — FAMOTIDINE 20 MG PO TABS
ORAL_TABLET | ORAL | Status: AC
  Filled 2019-05-18: qty 1

## 2019-05-18 MED ORDER — ACETAMINOPHEN 325 MG PO TABS
650.0000 mg | ORAL_TABLET | Freq: Once | ORAL | Status: AC
  Administered 2019-05-18: 650 mg via ORAL

## 2019-05-18 MED ORDER — DIPHENHYDRAMINE HCL 25 MG PO CAPS
ORAL_CAPSULE | ORAL | Status: AC
  Filled 2019-05-18: qty 2

## 2019-05-18 MED ORDER — DIPHENHYDRAMINE HCL 25 MG PO CAPS
50.0000 mg | ORAL_CAPSULE | Freq: Once | ORAL | Status: AC
  Administered 2019-05-18: 50 mg via ORAL

## 2019-05-18 MED ORDER — FAMOTIDINE 20 MG PO TABS
20.0000 mg | ORAL_TABLET | Freq: Once | ORAL | Status: AC
  Administered 2019-05-18: 20 mg via ORAL

## 2019-05-18 MED ORDER — METHYLPREDNISOLONE SODIUM SUCC 125 MG IJ SOLR
INTRAMUSCULAR | Status: AC
  Filled 2019-05-18: qty 2

## 2019-05-18 MED ORDER — SODIUM CHLORIDE 0.9 % IV SOLN
Freq: Once | INTRAVENOUS | Status: AC
  Administered 2019-05-18: 14:00:00 via INTRAVENOUS
  Filled 2019-05-18: qty 250

## 2019-05-18 MED ORDER — METHYLPREDNISOLONE SODIUM SUCC 125 MG IJ SOLR
125.0000 mg | Freq: Every day | INTRAMUSCULAR | Status: DC
  Administered 2019-05-18: 125 mg via INTRAVENOUS

## 2019-05-18 MED ORDER — ACETAMINOPHEN 325 MG PO TABS
ORAL_TABLET | ORAL | Status: AC
  Filled 2019-05-18: qty 2

## 2019-05-18 NOTE — Patient Instructions (Signed)
[facility] Discharge Instructions for Patients Receiving Chemotherapy  Today you received the following chemotherapy agents Rituximab (RITUXAN).  To help prevent nausea and vomiting after your treatment, we encourage you to take your nausea medication as prescribed.   If you develop nausea and vomiting that is not controlled by your nausea medication, call the clinic.   BELOW ARE SYMPTOMS THAT SHOULD BE REPORTED IMMEDIATELY:  *FEVER GREATER THAN 100.5 F  *CHILLS WITH OR WITHOUT FEVER  NAUSEA AND VOMITING THAT IS NOT CONTROLLED WITH YOUR NAUSEA MEDICATION  *UNUSUAL SHORTNESS OF BREATH  *UNUSUAL BRUISING OR BLEEDING  TENDERNESS IN MOUTH AND THROAT WITH OR WITHOUT PRESENCE OF ULCERS  *URINARY PROBLEMS  *BOWEL PROBLEMS  UNUSUAL RASH Items with * indicate a potential emergency and should be followed up as soon as possible.  Feel free to call the clinic should you have any questions or concerns. The clinic phone number is [phone].  Please show the CHEMO ALERT CARD at check-in to the Emergency Department and triage nurse.   

## 2019-05-19 ENCOUNTER — Encounter: Payer: Self-pay | Admitting: Hematology

## 2019-05-19 ENCOUNTER — Telehealth: Payer: Self-pay | Admitting: Hematology

## 2019-05-19 NOTE — Telephone Encounter (Signed)
I left a message with additional appointments that has been added to schedule

## 2019-06-23 DIAGNOSIS — Z1231 Encounter for screening mammogram for malignant neoplasm of breast: Secondary | ICD-10-CM | POA: Diagnosis not present

## 2019-07-06 ENCOUNTER — Encounter: Payer: Self-pay | Admitting: Hematology

## 2019-07-08 ENCOUNTER — Other Ambulatory Visit: Payer: Self-pay | Admitting: Hematology

## 2019-07-08 MED ORDER — VALACYCLOVIR HCL 1 G PO TABS: 1000.0000 mg | ORAL_TABLET | Freq: Two times a day (BID) | ORAL | 0 refills | Status: DC

## 2019-07-08 MED FILL — valACYclovir HCL 1 GM TABS: 1 | 10 days supply | Qty: 20 | Fill #0

## 2019-07-10 ENCOUNTER — Ambulatory Visit: Payer: 59 | Admitting: Hematology

## 2019-07-10 ENCOUNTER — Ambulatory Visit: Payer: 59

## 2019-07-10 ENCOUNTER — Other Ambulatory Visit: Payer: 59

## 2019-07-20 ENCOUNTER — Encounter: Payer: Self-pay | Admitting: Hematology

## 2019-07-22 ENCOUNTER — Other Ambulatory Visit: Payer: Self-pay

## 2019-07-22 DIAGNOSIS — Z20828 Contact with and (suspected) exposure to other viral communicable diseases: Secondary | ICD-10-CM | POA: Diagnosis not present

## 2019-07-22 DIAGNOSIS — Z20822 Contact with and (suspected) exposure to covid-19: Secondary | ICD-10-CM

## 2019-07-24 LAB — NOVEL CORONAVIRUS, NAA: SARS-CoV-2, NAA: NOT DETECTED

## 2019-07-27 ENCOUNTER — Inpatient Hospital Stay (HOSPITAL_BASED_OUTPATIENT_CLINIC_OR_DEPARTMENT_OTHER): Payer: 59 | Admitting: Hematology

## 2019-07-27 ENCOUNTER — Inpatient Hospital Stay: Payer: 59 | Attending: Hematology and Oncology

## 2019-07-27 ENCOUNTER — Inpatient Hospital Stay: Payer: 59

## 2019-07-27 ENCOUNTER — Other Ambulatory Visit: Payer: Self-pay

## 2019-07-27 VITALS — BP 143/77 | HR 81 | Temp 98.5°F | Resp 18

## 2019-07-27 VITALS — BP 164/77 | HR 77 | Temp 98.0°F | Resp 18 | Ht 62.5 in | Wt 148.7 lb

## 2019-07-27 DIAGNOSIS — C8307 Small cell B-cell lymphoma, spleen: Secondary | ICD-10-CM | POA: Diagnosis not present

## 2019-07-27 DIAGNOSIS — Z7189 Other specified counseling: Secondary | ICD-10-CM

## 2019-07-27 DIAGNOSIS — Z5112 Encounter for antineoplastic immunotherapy: Secondary | ICD-10-CM

## 2019-07-27 DIAGNOSIS — D696 Thrombocytopenia, unspecified: Secondary | ICD-10-CM | POA: Insufficient documentation

## 2019-07-27 DIAGNOSIS — Z23 Encounter for immunization: Secondary | ICD-10-CM

## 2019-07-27 DIAGNOSIS — D649 Anemia, unspecified: Secondary | ICD-10-CM | POA: Insufficient documentation

## 2019-07-27 LAB — CBC WITH DIFFERENTIAL/PLATELET
Abs Immature Granulocytes: 0.01 10*3/uL (ref 0.00–0.07)
Basophils Absolute: 0 10*3/uL (ref 0.0–0.1)
Basophils Relative: 1 %
Eosinophils Absolute: 0.1 10*3/uL (ref 0.0–0.5)
Eosinophils Relative: 3 %
HCT: 42.8 % (ref 36.0–46.0)
Hemoglobin: 14.8 g/dL (ref 12.0–15.0)
Immature Granulocytes: 0 %
Lymphocytes Relative: 22 %
Lymphs Abs: 1.1 10*3/uL (ref 0.7–4.0)
MCH: 29.5 pg (ref 26.0–34.0)
MCHC: 34.6 g/dL (ref 30.0–36.0)
MCV: 85.3 fL (ref 80.0–100.0)
Monocytes Absolute: 0.6 10*3/uL (ref 0.1–1.0)
Monocytes Relative: 11 %
Neutro Abs: 3.3 10*3/uL (ref 1.7–7.7)
Neutrophils Relative %: 63 %
Platelets: 193 10*3/uL (ref 150–400)
RBC: 5.02 MIL/uL (ref 3.87–5.11)
RDW: 13.2 % (ref 11.5–15.5)
WBC: 5.1 10*3/uL (ref 4.0–10.5)
nRBC: 0 % (ref 0.0–0.2)

## 2019-07-27 LAB — CMP (CANCER CENTER ONLY)
ALT: 50 U/L — ABNORMAL HIGH (ref 0–44)
AST: 26 U/L (ref 15–41)
Albumin: 4.6 g/dL (ref 3.5–5.0)
Alkaline Phosphatase: 78 U/L (ref 38–126)
Anion gap: 10 (ref 5–15)
BUN: 19 mg/dL (ref 8–23)
CO2: 25 mmol/L (ref 22–32)
Calcium: 9.2 mg/dL (ref 8.9–10.3)
Chloride: 106 mmol/L (ref 98–111)
Creatinine: 0.77 mg/dL (ref 0.44–1.00)
GFR, Est AFR Am: >60 mL/min (ref >60)
GFR, Estimated: >60 mL/min (ref >60)
Glucose, Bld: 116 mg/dL — ABNORMAL HIGH (ref 70–99)
Potassium: 4.2 mmol/L (ref 3.5–5.1)
Sodium: 141 mmol/L (ref 135–145)
Total Bilirubin: 0.7 mg/dL (ref 0.3–1.2)
Total Protein: 6.7 g/dL (ref 6.5–8.1)

## 2019-07-27 LAB — LACTATE DEHYDROGENASE: LDH: 235 U/L — ABNORMAL HIGH (ref 98–192)

## 2019-07-27 MED ORDER — INFLUENZA VAC SPLIT QUAD 0.5 ML IM SUSY
0.5000 mL | PREFILLED_SYRINGE | Freq: Once | INTRAMUSCULAR | Status: AC
  Administered 2019-07-27: 14:00:00 0.5 mL via INTRAMUSCULAR

## 2019-07-27 MED ORDER — FAMOTIDINE 20 MG PO TABS
ORAL_TABLET | ORAL | Status: AC
  Filled 2019-07-27: qty 1

## 2019-07-27 MED ORDER — METHYLPREDNISOLONE SODIUM SUCC 125 MG IJ SOLR
INTRAMUSCULAR | Status: AC
  Filled 2019-07-27: qty 2

## 2019-07-27 MED ORDER — DIPHENHYDRAMINE HCL 25 MG PO CAPS
ORAL_CAPSULE | ORAL | Status: AC
  Filled 2019-07-27: qty 1

## 2019-07-27 MED ORDER — INFLUENZA VAC SPLIT QUAD 0.5 ML IM SUSY
PREFILLED_SYRINGE | INTRAMUSCULAR | Status: AC
  Filled 2019-07-27: qty 0.5

## 2019-07-27 MED ORDER — ACETAMINOPHEN 325 MG PO TABS
ORAL_TABLET | ORAL | Status: AC
  Filled 2019-07-27: qty 2

## 2019-07-27 MED ORDER — FAMOTIDINE 20 MG PO TABS
20.0000 mg | ORAL_TABLET | Freq: Once | ORAL | Status: AC
  Administered 2019-07-27: 20 mg via ORAL

## 2019-07-27 MED ORDER — METHYLPREDNISOLONE SODIUM SUCC 125 MG IJ SOLR
125.0000 mg | Freq: Every day | INTRAMUSCULAR | Status: DC
  Administered 2019-07-27: 11:00:00 125 mg via INTRAVENOUS

## 2019-07-27 MED ORDER — SODIUM CHLORIDE 0.9 % IV SOLN
375.0000 mg/m2 | Freq: Once | INTRAVENOUS | Status: AC
  Administered 2019-07-27: 12:00:00 600 mg via INTRAVENOUS
  Filled 2019-07-27: qty 10

## 2019-07-27 MED ORDER — SODIUM CHLORIDE 0.9 % IV SOLN
Freq: Once | INTRAVENOUS | Status: AC
  Administered 2019-07-27: 11:00:00 via INTRAVENOUS
  Filled 2019-07-27: qty 250

## 2019-07-27 MED ORDER — ACETAMINOPHEN 325 MG PO TABS
650.0000 mg | ORAL_TABLET | Freq: Once | ORAL | Status: AC
  Administered 2019-07-27: 11:00:00 650 mg via ORAL

## 2019-07-27 MED ORDER — DIPHENHYDRAMINE HCL 25 MG PO CAPS
50.0000 mg | ORAL_CAPSULE | Freq: Once | ORAL | Status: AC
  Administered 2019-07-27: 11:00:00 50 mg via ORAL

## 2019-07-27 NOTE — Progress Notes (Signed)
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 She is accompanied today by three members of her family. The pt reports that she is doing well overall.   The pt notes that on March 6 she developed a low-grade fever and continues to have a fever. She has kept her fever at [place] with Tylenol every 6 hours. She notes that today she has felt the least feverish since March 6.  She saw her PCP Dr. Hulan Fess who ruled out influenza and UTI. She has also seen rheumatology over the last couple weeks and had a rheumatological workup that did not show overt evidence of a primary autoimmune condition. She was noted to be neg for ANA, RF and CCP Ab. Interesting was noted to have low C4 levels.  She notes that her resting HR has been elevated and describes her heart rate as tachycardic. She had an ECHO on 12/26/17 which showed a normal EF, and was then referred to see infectious disease: CMV and TB Quantiferon were negative.  She notes that the ID team told her there was no clear evidence of an infectious process. TTE no vegetation. BL Bcx neg.  She notes that last week she developed a cough, but believes it to be allergy related. She notes that she has had an enlarged spleen since at least October 2018 revealed by a 07/31/17 US Abdomen. She notes some abdominal discomfort, and has felt that she gets full quickly after eating.  In the last year she intentionally dropped about 20 lbs of weight with change in her diet and avoiding most carbs. She notes that in the last 3 weeks, since she began feeling ill this month, she  has lost about 6-7 lbs.   She also notes that she has been having night sweats that have returned this month after a few months of night sweats last year which abated in August 2018. She notes no other signs of recent infection, and denies any travel outside of the country. She denies any concern of insect bites.   She notes that in [place] she was on iron pills for low Hgb, which increased her Hgb. She stopped taking after her Hgb normalized.  She notes that in March 2018 she had a bad stomach bug and in December 2017 she had influenza.  She notes that she has been under a lot of stress with the deaths of her mother and step-father in a couple weeks of one another.   The pt reports that she has seen rheumatology twice for her hand, knee and hip pain but was confirmed to have osteoarthritis as opposed to rheumatoid arthritis. She notes that the severity of her joint pains mirror the content of her diet. She notes that her joint have never been red or swollen.   She notes that she will be having an iron infusion on 01/03/18.  Of note prior to the patient's visit today, pt has had BM Bx completed on 12/27/17 with results revealing HYPERCELLULAR BONE MARROW FOR AGE WITH NUMEROUS ATYPICAL LYMPHOID AGGREGATES. - TRILINEAGE HEMATOPOIESIS. On 12/27/17 the patient's flow cytometry revealed  A MINOR MONOCLONAL B-CELL POPULATION IDENTIFIED.  Flow cytometric analysis shows a minor population of monoclonal B cells (17% of all lymphocytes) expressing pan B-cell antigens including CD20 with lack of CD5, CD10, CD25 or CD103 expression. Immunohistochemical stains were also performed and show that the lymphoid aggregates show a mixture of T and B cells with slight predominance of T cells. No significant cyclin D1, CD34, or TdT positivity is identified. The overall findings are limited but slightly favor involvement by a B-cell lymphoproliferative process. Consideration was given to marginal zone lymphoma and  splenic lymphoma especially in the presence of splenomegaly. Nonetheless, additional material (ie lymph node or tissue biopsy) is strongly recommended for further evaluation.  I discussed the BM Bx results with Dr Gari Crown -- overall BM Bx + clinical picture certainly concerning for SMZL with possible coombs neg AIHA and possible autoimmune phenomenon related to [place]?Marland Kitchen  Most recent lab results (12/30/17) of CBC  is as follows: all values are WNL except for WBC at 3.5k, RBC at 3.18, Hgb at 7.6, HCT at 24.4, MCV at 76.7, MCH at 23.9, MCHC at 31.1, RDW at 17.0. LDH 12/30/17 was at 616. Haptoglobin<10 -- suggestive of hemolysis. C-reactive protein 09/10/17 was elevated at 20.5.   On review of systems, pt reports low-grade fever, night sweats, some fatigue, tachycardia, occasional lightheadedness, and denies dizziness, weakness, noticing any enlarged lymph nodes, changes in bowel habits, abdominal pains, abnormal vaginal discharge, and any other symptoms.   On PMHx the pt reports high blood pressure and degenerative arthritis. On Family Hx the pt reports her father had rheumatoid arthritis, her mother had kidney disease.  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 The patient's last visit with [place] was on 05/18/2019. The pt reports that she is doing well overall.  The pt reports that she did take a trip to the beach but had to cut her trip short due to exposure to [place]. She saw her friend on Friday, who had been exposed before their meeting and has since tested positive for [place]. She was tested five days post exposure and was negative. Pt has not experienced any symptoms related to [place]. Pt is nervous about taking her treatment today but is also concerned about already being 3 weeks past her original treatment date. She has not had her annual flu shot and is interested in getting it today. Pt is experiencing some  generalized lower back pain but notes that recent weight gain and sleeping on a different mattress could have contributed.   Lab results today (07/27/19) of CBC w/diff and CMP is as follows: all values are WNL except for Glucose at 116, ALT at 50. 07/27/2019 LDH at 235  On review of systems, pt reports low back pain and denies unexpected weight loss, new joint swelling, skin rashes and any other symptoms.    MEDICAL HISTORY:  Past Medical History:  Diagnosis Date  . Arthritis   . Hypertension     SURGICAL HISTORY: No past surgical history on file.  SOCIAL HISTORY: Social History   Socioeconomic History  . Marital status: Single    Spouse name: Not on file  . Number of children: Not on file  . Years of education: Not on file  . Highest education level: Not on file  Occupational History  . Not on file  Social Needs  . Financial resource strain: Not on file  . Food insecurity    Worry: Not  on file    Inability: Not on file  . Transportation needs    Medical: Not on file    Non-medical: Not on file  Tobacco Use  . Smoking status: Never Smoker  . Smokeless tobacco: Never Used  Substance and Sexual Activity  . Alcohol use: Not Currently    Alcohol/week: 0.0 standard drinks  . Drug use: Never  . Sexual activity: Not on file  Lifestyle  . Physical activity    Days per week: Not on file    Minutes per session: Not on file  . Stress: Not on file  Relationships  . Social Herbalist on phone: Not on file    Gets together: Not on file    Attends religious service: Not on file    Active member of club or organization: Not on file    Attends meetings of clubs or organizations: Not on file    Relationship status: Not on file  . Intimate partner violence    Fear of current or ex partner: Not on file    Emotionally abused: Not on file    Physically abused: Not on file    Forced sexual activity: Not on file  Other Topics Concern  . Not on file  Social History  Narrative  . Not on file    FAMILY HISTORY: Family History  Problem Relation Age of Onset  . Diabetes Mother   . Hyperlipidemia Mother   . Hypertension Mother     ALLERGIES:  is allergic to biaxin [clarithromycin].  MEDICATIONS:  Current Outpatient Medications  Medication Sig Dispense Refill  . folic acid (FOLVITE) [id number] MCG tablet Take 800 mcg by mouth daily.     . valACYclovir (VALTREX) 1000 MG tablet Take 1 tablet (1,000 mg total) by mouth 2 (two) times daily. 20 tablet 0  . vitamin B-12 (CYANOCOBALAMIN) 1000 MCG tablet Take 1,000 mcg by mouth daily.     No current facility-administered medications for this visit.     REVIEW OF SYSTEMS:   A 10+ POINT REVIEW OF SYSTEMS WAS OBTAINED including neurology, dermatology, psychiatry, cardiac, respiratory, lymph, extremities, GI, GU, Musculoskeletal, constitutional, breasts, reproductive, HEENT.  All pertinent positives are noted in the HPI.  All others are negative.    PHYSICAL EXAMINATION: ECOG PERFORMANCE STATUS: 1 - Symptomatic but completely ambulatory  Vitals:   07/27/19 1026  BP: (!) 164/77  Pulse: 77  Resp: 18  Temp: 98 F (36.7 C)  SpO2: 98%   Filed Weights   07/27/19 1026  Weight: 148 lb 11.2 oz (67.4 kg)   .Body mass index is 26.76 kg/m.   GENERAL:alert, in no acute distress and comfortable SKIN: no acute rashes, no significant lesions EYES: conjunctiva are pink and non-injected, sclera anicteric OROPHARYNX: MMM, no exudates, no oropharyngeal erythema or ulceration NECK: supple, no JVD LYMPH:  no palpable lymphadenopathy in the cervical, axillary or inguinal regions LUNGS: clear to auscultation b/l with normal respiratory effort HEART: regular rate & rhythm ABDOMEN:  normoactive bowel sounds , non tender, not distended. No palpable hepatosplenomegaly.  Extremity: no pedal edema PSYCH: alert & oriented x 3 with fluent speech NEURO: no focal motor/sensory deficits  LABORATORY DATA:  I have reviewed the  data as listed  . CBC Latest Ref Rng & Units 07/27/2019 05/18/2019 03/10/2019  WBC 4.0 - 10.5 K/uL 5.1 5.6 5.6  Hemoglobin 12.0 - 15.0 g/dL 14.8 15.2(H) 15.3(H)  Hematocrit 36.0 - 46.0 % 42.8 44.5 45.5  Platelets 150 - 400 K/uL  193 215 212   . CBC    Component Value Date/Time   WBC 5.1 07/27/2019 0908   RBC 5.02 07/27/2019 0908   HGB 14.8 07/27/2019 0908   HGB 14.5 07/03/2018 0835   HGB 12.8 09/10/2017 0801   HCT 42.8 07/27/2019 0908   HCT 39.4 09/10/2017 0801   PLT 193 07/27/2019 0908   PLT 183 07/03/2018 0835   PLT 144 (L) 09/10/2017 0801   MCV 85.3 07/27/2019 0908   MCV 79.0 (L) 09/10/2017 0801   MCH 29.5 07/27/2019 0908   MCHC 34.6 07/27/2019 0908   RDW 13.2 07/27/2019 0908   RDW 15.6 (H) 09/10/2017 0801   LYMPHSABS 1.1 07/27/2019 0908   LYMPHSABS 0.9 09/10/2017 0801   MONOABS 0.6 07/27/2019 0908   MONOABS 0.4 09/10/2017 0801   EOSABS 0.1 07/27/2019 0908   EOSABS 0.1 09/10/2017 0801   BASOSABS 0.0 07/27/2019 0908   BASOSABS 0.0 09/10/2017 0801    . CMP Latest Ref Rng & Units 07/27/2019 05/18/2019 03/10/2019  Glucose 70 - 99 mg/dL 116(H) 99 121(H)  BUN 8 - 23 mg/dL 19 16 17   Creatinine 0.44 - 1.00 mg/dL 0.77 0.82 0.82  Sodium 135 - 145 mmol/L 141 140 142  Potassium 3.5 - 5.1 mmol/L 4.2 4.1 4.1  Chloride 98 - 111 mmol/L 106 106 107  CO2 22 - 32 mmol/L 25 23 25   Calcium 8.9 - 10.3 mg/dL 9.2 9.4 9.1  Total Protein 6.5 - 8.1 g/dL 6.7 6.9 6.8  Total Bilirubin 0.3 - 1.2 mg/dL 0.7 0.9 0.8  Alkaline Phos 38 - 126 U/L 78 71 71  AST 15 - 41 U/L 26 31 21   ALT 0 - 44 U/L 50(H) 63(H) 41   . Lab Results  Component Value Date   LDH 235 (H) 07/27/2019     12/27/17 BM Bx:   12/27/17 Flow Cytometry:     RADIOGRAPHIC STUDIES: I have personally reviewed the radiological images as listed and agreed with the findings in the report. No results found.  ASSESSMENT & PLAN:   63 y.o. female with  1. S/p ancytopenia (resolving) due to recently diagnosed Splenic marginal  zone lymphoma. Patient appeared to be have significant constitutional symptoms . No evidence of rheumatological or infectious etiology for her fevers and night sweats. Constitutional symptoms now resolved.  2. S/p Splenomegaly - likely due to [place]. Marland Kitchenimproved on [place] abd. Clinical no palpable splenomegaly today.   04/18/18 US Abdomen revealed Splenomegaly with a length of 13.3 cm and a volume of 432 cc. By report, the spleen measured up to 15.1 cm in maximum dimension on the PET-CT from January 08, 2018 suggesting interval improvement.   3. Anemia - with elevated LDH and low haptoglobin concerning for hemolysis. Coombs neg . Could be IgA driven Coombs neg AIHA. +ve reticulocytosis and Smear with some microspherocytes. No over urine hemosiderinuria. Partly anemia could also be from hypersplenism.  hgb normal now at 14.1  4. Low C4 ? Immune complex disease/autoimmunity related to lymphoma. Per rheumatology - no evidence of primary rheumatological disorder. No overt evidence of endocarditis.  5. h/o Fevers/chills/night sweat -- likely constitutional symptoms related to ?lymphoma - now resolved. 6. Thrombocytopenia likely from splenomegaly vs lymphoma-- resolved. PLT resolved to 187k on 09/11/18 labs  PLAN:  -Discussed pt labwork today, 07/27/19; blood counts and chemistries are normal -Discussed 07/27/2019 LDH is slightly high at 235 -Advised pt that there is no clear reason to hold her treatment today, given her negative COVID19 test -Will give  pt flu shot today after Tx with her consent -The pt shows no clinical or lab progression of her Splenic marginal zone lymphoma at this time.  -The pt has no prohibitive toxicities from continuing C7 of maintenance Rituxan at this time. -The pt and I will continue to watch for constitutional symptoms -Continue Vitamin B complex and Folic Acid supplement to support accelerated hematopoiesis -Will see back in 2 months   FOLLOW UP: -Flu shot after Rituxan  infusion today -RTC in 2 months as per scheduled appointment in dec 2020 for next dose of Rituxan infusion with labs and MD visit   The total time spent in the appt was 25 minutes and more than 50% was on counseling and direct patient cares.  All of the patient's questions were answered with apparent satisfaction. The patient knows to call the clinic with any problems, questions or concerns.    Sullivan Lone MD [place] AAHIVMS [facility] Hematology/Oncology Physician [facility]  (Office):       [phone] (Work cell):  [phone] (Fax):           [phone]  07/27/2019 10:49 AM  I, Yevette Edwards, am acting as a scribe for Dr. Sullivan Lone.   .I have reviewed the above documentation for accuracy and completeness, and I agree with the above. Brunetta Genera MD

## 2019-07-28 ENCOUNTER — Telehealth: Payer: Self-pay | Admitting: Hematology

## 2019-07-28 NOTE — Telephone Encounter (Signed)
Per 10/19 los Flu shot after Rituxan infusion today -RTC in 2 months as per scheduled appointment in dec 2020 for next dose of Rituxan infusion with labs and MD visit

## 2019-09-09 ENCOUNTER — Ambulatory Visit: Payer: 59 | Admitting: Hematology

## 2019-09-09 ENCOUNTER — Ambulatory Visit: Payer: 59

## 2019-09-09 ENCOUNTER — Other Ambulatory Visit: Payer: 59

## 2019-09-23 ENCOUNTER — Ambulatory Visit: Payer: 59 | Attending: Internal Medicine

## 2019-09-23 ENCOUNTER — Other Ambulatory Visit: Payer: Self-pay

## 2019-09-23 DIAGNOSIS — Z20822 Contact with and (suspected) exposure to covid-19: Secondary | ICD-10-CM

## 2019-09-23 DIAGNOSIS — Z20828 Contact with and (suspected) exposure to other viral communicable diseases: Secondary | ICD-10-CM | POA: Diagnosis not present

## 2019-09-25 ENCOUNTER — Telehealth: Payer: Self-pay | Admitting: Hematology

## 2019-09-25 ENCOUNTER — Other Ambulatory Visit: Payer: Self-pay

## 2019-09-25 ENCOUNTER — Encounter: Payer: Self-pay | Admitting: Hematology

## 2019-09-25 ENCOUNTER — Inpatient Hospital Stay: Payer: 59

## 2019-09-25 ENCOUNTER — Inpatient Hospital Stay: Payer: 59 | Attending: Hematology and Oncology

## 2019-09-25 ENCOUNTER — Inpatient Hospital Stay (HOSPITAL_BASED_OUTPATIENT_CLINIC_OR_DEPARTMENT_OTHER): Payer: 59 | Admitting: Hematology

## 2019-09-25 VITALS — BP 163/98 | HR 79 | Temp 97.9°F | Resp 18 | Ht 62.5 in | Wt 151.1 lb

## 2019-09-25 DIAGNOSIS — D696 Thrombocytopenia, unspecified: Secondary | ICD-10-CM | POA: Insufficient documentation

## 2019-09-25 DIAGNOSIS — C884 Extranodal marginal zone B-cell lymphoma of mucosa-associated lymphoid tissue [MALT-lymphoma]: Secondary | ICD-10-CM | POA: Insufficient documentation

## 2019-09-25 DIAGNOSIS — C8307 Small cell B-cell lymphoma, spleen: Secondary | ICD-10-CM

## 2019-09-25 DIAGNOSIS — D649 Anemia, unspecified: Secondary | ICD-10-CM | POA: Insufficient documentation

## 2019-09-25 LAB — CBC WITH DIFFERENTIAL/PLATELET
Abs Immature Granulocytes: 0.01 10*3/uL (ref 0.00–0.07)
Basophils Absolute: 0 10*3/uL (ref 0.0–0.1)
Basophils Relative: 1 %
Eosinophils Absolute: 0.2 10*3/uL (ref 0.0–0.5)
Eosinophils Relative: 3 %
HCT: 45.2 % (ref 36.0–46.0)
Hemoglobin: 15.4 g/dL — ABNORMAL HIGH (ref 12.0–15.0)
Immature Granulocytes: 0 %
Lymphocytes Relative: 24 %
Lymphs Abs: 1.3 10*3/uL (ref 0.7–4.0)
MCH: 29.5 pg (ref 26.0–34.0)
MCHC: 34.1 g/dL (ref 30.0–36.0)
MCV: 86.6 fL (ref 80.0–100.0)
Monocytes Absolute: 0.6 10*3/uL (ref 0.1–1.0)
Monocytes Relative: 11 %
Neutro Abs: 3.3 10*3/uL (ref 1.7–7.7)
Neutrophils Relative %: 61 %
Platelets: 220 10*3/uL (ref 150–400)
RBC: 5.22 MIL/uL — ABNORMAL HIGH (ref 3.87–5.11)
RDW: 12.7 % (ref 11.5–15.5)
WBC: 5.4 10*3/uL (ref 4.0–10.5)
nRBC: 0 % (ref 0.0–0.2)

## 2019-09-25 LAB — NOVEL CORONAVIRUS, NAA: SARS-CoV-2, NAA: NOT DETECTED

## 2019-09-25 LAB — CMP (CANCER CENTER ONLY)
ALT: 66 U/L — ABNORMAL HIGH (ref 0–44)
AST: 33 U/L (ref 15–41)
Albumin: 4.7 g/dL (ref 3.5–5.0)
Alkaline Phosphatase: 80 U/L (ref 38–126)
Anion gap: 11 (ref 5–15)
BUN: 19 mg/dL (ref 8–23)
CO2: 23 mmol/L (ref 22–32)
Calcium: 9.6 mg/dL (ref 8.9–10.3)
Chloride: 107 mmol/L (ref 98–111)
Creatinine: 0.75 mg/dL (ref 0.44–1.00)
GFR, Est AFR Am: >60 mL/min (ref >60)
GFR, Estimated: >60 mL/min (ref >60)
Glucose, Bld: 113 mg/dL — ABNORMAL HIGH (ref 70–99)
Potassium: 4 mmol/L (ref 3.5–5.1)
Sodium: 141 mmol/L (ref 135–145)
Total Bilirubin: 0.7 mg/dL (ref 0.3–1.2)
Total Protein: 6.9 g/dL (ref 6.5–8.1)

## 2019-09-25 LAB — LACTATE DEHYDROGENASE: LDH: 246 U/L — ABNORMAL HIGH (ref 98–192)

## 2019-09-25 NOTE — Telephone Encounter (Signed)
Scheduled appt per 12/18 los.  Spoke with pt and she is aware of the appt date and time.

## 2019-09-25 NOTE — Progress Notes (Signed)
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 She is accompanied today by three members of her family. The pt reports that she is doing well overall.   The pt notes that on March 6 she developed a low-grade fever and continues to have a fever. She has kept her fever at [place] with Tylenol every 6 hours. She notes that today she has felt the least feverish since March 6.  She saw her PCP Dr. Hulan Fess who ruled out influenza and UTI. She has also seen rheumatology over the last couple weeks and had a rheumatological workup that did not show overt evidence of a primary autoimmune condition. She was noted to be neg for ANA, RF and CCP Ab. Interesting was noted to have low C4 levels.  She notes that her resting HR has been elevated and describes her heart rate as tachycardic. She had an ECHO on 12/26/17 which showed a normal EF, and was then referred to see infectious disease: CMV and TB Quantiferon were negative.  She notes that the ID team told her there was no clear evidence of an infectious process. TTE no vegetation. BL Bcx neg.  She notes that last week she developed a cough, but believes it to be allergy related. She notes that she has had an enlarged spleen since at least October 2018 revealed by a 07/31/17 US Abdomen. She notes some abdominal discomfort, and has felt that she gets full quickly after eating.  In the last year she intentionally dropped about 20 lbs of weight with change in her diet and avoiding most carbs. She notes that in the last 3 weeks, since she began feeling ill this month, she  has lost about 6-7 lbs.   She also notes that she has been having night sweats that have returned this month after a few months of night sweats last year which abated in August 2018. She notes no other signs of recent infection, and denies any travel outside of the country. She denies any concern of insect bites.   She notes that in [place] she was on iron pills for low Hgb, which increased her Hgb. She stopped taking after her Hgb normalized.  She notes that in March 2018 she had a bad stomach bug and in December 2017 she had influenza.  She notes that she has been under a lot of stress with the deaths of her mother and step-father in a couple weeks of one another.   The pt reports that she has seen rheumatology twice for her hand, knee and hip pain but was confirmed to have osteoarthritis as opposed to rheumatoid arthritis. She notes that the severity of her joint pains mirror the content of her diet. She notes that her joint have never been red or swollen.   She notes that she will be having an iron infusion on 01/03/18.  Of note prior to the patient's visit today, pt has had BM Bx completed on 12/27/17 with results revealing HYPERCELLULAR BONE MARROW FOR AGE WITH NUMEROUS ATYPICAL LYMPHOID AGGREGATES. - TRILINEAGE HEMATOPOIESIS. On 12/27/17 the patient's flow cytometry revealed  A MINOR MONOCLONAL B-CELL POPULATION IDENTIFIED.  Flow cytometric analysis shows a minor population of monoclonal B cells (17% of all lymphocytes) expressing pan B-cell antigens including CD20 with lack of CD5, CD10, CD25 or CD103 expression. Immunohistochemical stains were also performed and show that the lymphoid aggregates show a mixture of T and B cells with slight predominance of T cells. No significant cyclin D1, CD34, or TdT positivity is identified. The overall findings are limited but slightly favor involvement by a B-cell lymphoproliferative process. Consideration was given to marginal zone lymphoma and  splenic lymphoma especially in the presence of splenomegaly. Nonetheless, additional material (ie lymph node or tissue biopsy) is strongly recommended for further evaluation.  I discussed the BM Bx results with Dr Gari Crown -- overall BM Bx + clinical picture certainly concerning for SMZL with possible coombs neg AIHA and possible autoimmune phenomenon related to [place]?Marland Kitchen  Most recent lab results (12/30/17) of CBC  is as follows: all values are WNL except for WBC at 3.5k, RBC at 3.18, Hgb at 7.6, HCT at 24.4, MCV at 76.7, MCH at 23.9, MCHC at 31.1, RDW at 17.0. LDH 12/30/17 was at 616. Haptoglobin<10 -- suggestive of hemolysis. C-reactive protein 09/10/17 was elevated at 20.5.   On review of systems, pt reports low-grade fever, night sweats, some fatigue, tachycardia, occasional lightheadedness, and denies dizziness, weakness, noticing any enlarged lymph nodes, changes in bowel habits, abdominal pains, abnormal vaginal discharge, and any other symptoms.   On PMHx the pt reports high blood pressure and degenerative arthritis. On Family Hx the pt reports her father had rheumatoid arthritis, her mother had kidney disease.  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 The patient's last visit with [place] was on 07/27/2019. The pt reports that she is doing well overall.  The pt reports she doesn't feel well today. She has had a headache for the past 3 days, but today it was slightly worse. She is also experiencing some nausea today.   She took a COVID-19 test on 09/23/2019. Her live in house maid was in close contact with someone (both were wearing a mask) who had covid-19 on 09/15/2019 and 09/17/2019. The maid was tested on 09/23/2019 as well. They are both pending their results.  Lab results today (09/25/19) of CBC w/diff and CMP is as follows: all values are WNL except for RBC at 5.22, Hemoglobin at 15.4, Glucose Bld at 113, ALT  at 66, PENDING LDH.  On review of systems, pt reports headache, nausea, normal bowel habits and denies new back pain, new joint pain, abdominal pain, leg swelling, and any other symptoms.   MEDICAL HISTORY:  Past Medical History:  Diagnosis Date  . Arthritis   . Hypertension     SURGICAL HISTORY: No past surgical history on file.  SOCIAL HISTORY: Social History   Socioeconomic History  . Marital status: Single    Spouse name: Not on file  . Number of children: Not on file  . Years of education: Not on file  . Highest education level: Not on file  Occupational History  . Not on file  Tobacco Use  . Smoking status: Never Smoker  . Smokeless tobacco: Never Used  Substance and Sexual Activity  . Alcohol use: Not Currently    Alcohol/week: 0.0 standard drinks  . Drug use: Never  . Sexual activity: Not on file  Other Topics Concern  . Not on file  Social History Narrative  .  Not on file   Social Determinants of Health   Financial Resource Strain:   . Difficulty of Paying Living Expenses: Not on file  Food Insecurity:   . Worried About Charity fundraiser in the Last Year: Not on file  . Ran Out of Food in the Last Year: Not on file  Transportation Needs:   . Lack of Transportation (Medical): Not on file  . Lack of Transportation (Non-Medical): Not on file  Physical Activity:   . Days of Exercise per Week: Not on file  . Minutes of Exercise per Session: Not on file  Stress:   . Feeling of Stress : Not on file  Social Connections:   . Frequency of Communication with Friends and Family: Not on file  . Frequency of Social Gatherings with Friends and Family: Not on file  . Attends Religious Services: Not on file  . Active Member of Clubs or Organizations: Not on file  . Attends Archivist Meetings: Not on file  . Marital Status: Not on file  Intimate Partner Violence:   . Fear of Current or Ex-Partner: Not on file  . Emotionally Abused: Not on file  .  Physically Abused: Not on file  . Sexually Abused: Not on file    FAMILY HISTORY: Family History  Problem Relation Age of Onset  . Diabetes Mother   . Hyperlipidemia Mother   . Hypertension Mother     ALLERGIES:  is allergic to biaxin [clarithromycin].  MEDICATIONS:  Current Outpatient Medications  Medication Sig Dispense Refill  . folic acid (FOLVITE) [id number] MCG tablet Take 800 mcg by mouth daily.     . valACYclovir (VALTREX) 1000 MG tablet Take 1 tablet (1,000 mg total) by mouth 2 (two) times daily. 20 tablet 0  . vitamin B-12 (CYANOCOBALAMIN) 1000 MCG tablet Take 1,000 mcg by mouth daily.     No current facility-administered medications for this visit.    REVIEW OF SYSTEMS:   A 10+ POINT REVIEW OF SYSTEMS WAS OBTAINED including neurology, dermatology, psychiatry, cardiac, respiratory, lymph, extremities, GI, GU, Musculoskeletal, constitutional, breasts, reproductive, HEENT.  All pertinent positives are noted in the HPI.  All others are negative.      PHYSICAL EXAMINATION: ECOG FS:1 - Symptomatic but completely ambulatory  Vitals:   09/25/19 0946  BP: (!) 163/98  Pulse: 79  Resp: 18  Temp: 97.9 F (36.6 C)  SpO2: 99%   Wt Readings from Last 3 Encounters:  09/25/19 151 lb 1.6 oz (68.5 kg)  07/27/19 148 lb 11.2 oz (67.4 kg)  05/18/19 146 lb 6.4 oz (66.4 kg)   Body mass index is 27.2 kg/m.    GENERAL:alert, in no acute distress and comfortable SKIN: no acute rashes, no significant lesions EYES: conjunctiva are pink and non-injected, sclera anicteric OROPHARYNX: MMM, no exudates, no oropharyngeal erythema or ulceration NECK: supple, no JVD LYMPH:  no palpable lymphadenopathy in the cervical, axillary or inguinal regions LUNGS: clear to auscultation b/l with normal respiratory effort HEART: regular rate & rhythm ABDOMEN:  normoactive bowel sounds , non tender, not distended. Extremity: no pedal edema PSYCH: alert & oriented x 3 with fluent speech NEURO: no focal  motor/sensory deficits  LABORATORY DATA:  I have reviewed the data as listed  . CBC Latest Ref Rng & Units 09/25/2019 07/27/2019 05/18/2019  WBC 4.0 - 10.5 K/uL 5.4 5.1 5.6  Hemoglobin 12.0 - 15.0 g/dL 15.4(H) 14.8 15.2(H)  Hematocrit 36.0 - 46.0 % 45.2 42.8 44.5  Platelets 150 -  400 K/uL 220 193 215   . CBC    Component Value Date/Time   WBC 5.4 09/25/2019 0905   RBC 5.22 (H) 09/25/2019 0905   HGB 15.4 (H) 09/25/2019 0905   HGB 14.5 07/03/2018 0835   HGB 12.8 09/10/2017 0801   HCT 45.2 09/25/2019 0905   HCT 39.4 09/10/2017 0801   PLT 220 09/25/2019 0905   PLT 183 07/03/2018 0835   PLT 144 (L) 09/10/2017 0801   MCV 86.6 09/25/2019 0905   MCV 79.0 (L) 09/10/2017 0801   MCH 29.5 09/25/2019 0905   MCHC 34.1 09/25/2019 0905   RDW 12.7 09/25/2019 0905   RDW 15.6 (H) 09/10/2017 0801   LYMPHSABS 1.3 09/25/2019 0905   LYMPHSABS 0.9 09/10/2017 0801   MONOABS 0.6 09/25/2019 0905   MONOABS 0.4 09/10/2017 0801   EOSABS 0.2 09/25/2019 0905   EOSABS 0.1 09/10/2017 0801   BASOSABS 0.0 09/25/2019 0905   BASOSABS 0.0 09/10/2017 0801    . CMP Latest Ref Rng & Units 09/25/2019 07/27/2019 05/18/2019  Glucose 70 - 99 mg/dL 113(H) 116(H) 99  BUN 8 - 23 mg/dL 19 19 16   Creatinine 0.44 - 1.00 mg/dL 0.75 0.77 0.82  Sodium 135 - 145 mmol/L 141 141 140  Potassium 3.5 - 5.1 mmol/L 4.0 4.2 4.1  Chloride 98 - 111 mmol/L 107 106 106  CO2 22 - 32 mmol/L 23 25 23   Calcium 8.9 - 10.3 mg/dL 9.6 9.2 9.4  Total Protein 6.5 - 8.1 g/dL 6.9 6.7 6.9  Total Bilirubin 0.3 - 1.2 mg/dL 0.7 0.7 0.9  Alkaline Phos 38 - 126 U/L 80 78 71  AST 15 - 41 U/L 33 26 31  ALT 0 - 44 U/L 66(H) 50(H) 63(H)   . Lab Results  Component Value Date   LDH 246 (H) 09/25/2019     12/27/17 BM Bx:   12/27/17 Flow Cytometry:     RADIOGRAPHIC STUDIES: I have personally reviewed the radiological images as listed and agreed with the findings in the report. No results found.  ASSESSMENT & PLAN:   63 y.o. female  with  1. S/p ancytopenia (resolving) due to recently diagnosed Splenic marginal zone lymphoma. Patient appeared to be have significant constitutional symptoms . No evidence of rheumatological or infectious etiology for her fevers and night sweats. Constitutional symptoms now resolved.  2. S/p Splenomegaly - likely due to Iron. Marland Kitchenimproved on [place] abd. Clinical no palpable splenomegaly today.   04/18/18 US Abdomen revealed Splenomegaly with a length of 13.3 cm and a volume of 432 cc. By report, the spleen measured up to 15.1 cm in maximum dimension on the PET-CT from January 08, 2018 suggesting interval improvement.   3. Anemia - with elevated LDH and low haptoglobin concerning for hemolysis. Coombs neg . Could be IgA driven Coombs neg AIHA. +ve reticulocytosis and Smear with some microspherocytes. No over urine hemosiderinuria. Partly anemia could also be from hypersplenism.  hgb normal now at 14.1  4. Low C4 ? Immune complex disease/autoimmunity related to lymphoma. Per rheumatology - no evidence of primary rheumatological disorder. No overt evidence of endocarditis.  5. h/o Fevers/chills/night sweat -- likely constitutional symptoms related to ?lymphoma - now resolved. 6. Thrombocytopenia likely from splenomegaly vs lymphoma-- resolved. PLT resolved to 187k on 09/11/18 labs  PLAN:  -Discussed pt labwork today, 09/25/19; all values are WNL except for RBC at 5.22, Hemoglobin at 15.4, Glucose Bld at 113, ALT at 66, PENDING LDH. -Discussed that she in cycle 8. -Discussed that due  to the uncertainty of her [id number] results we should hold her treatment today and resume in a month. -Recommended to follow up with clinic with her COVID-19 results. Also suggested that she may need to be tested again for COVID-19 due to being tested prior to the 5 day period between contact with the possible COVID-19 case.   FOLLOW UP: -Please reschedule maintenance Rituxan dose from today ahead 4 weeks with labs (postponed  due to potential COVID 19 exposure with home help) -Please schedule subsequent maintenance Rituxan 2 months after the next dose in 4 weeks with labs and MD visit   The total time spent in the appt was 25 minutes and more than 50% was on counseling and direct patient cares.  All of the patient's questions were answered with apparent satisfaction. The patient knows to call the clinic with any problems, questions or concerns.    Sullivan Lone MD MS AAHIVMS [facility] Hematology/Oncology Physician [facility]  (Office):       [phone] (Work cell):  [phone] (Fax):           [phone]  09/25/2019 6:19 AM I, Scot Dock, am acting as a scribe for Dr. Sullivan Lone.   .I have reviewed the above documentation for accuracy and completeness, and I agree with the above. Brunetta Genera MD

## 2019-10-01 ENCOUNTER — Other Ambulatory Visit: Payer: 59

## 2019-10-21 NOTE — Progress Notes (Signed)
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 She is accompanied today by three members of her family. The pt reports that she is doing well overall.   The pt notes that on March 6 she developed a low-grade fever and continues to have a fever. She has kept her fever at [place] with Tylenol every 6 hours. She notes that today she has felt the least feverish since March 6.  She saw her PCP Dr. Hulan Fess who ruled out influenza and UTI. She has also seen rheumatology over the last couple weeks and had a rheumatological workup that did not show overt evidence of a primary autoimmune condition. She was noted to be neg for ANA, RF and CCP Ab. Interesting was noted to have low C4 levels.  She notes that her resting HR has been elevated and describes her heart rate as tachycardic. She had an ECHO on 12/26/17 which showed a normal EF, and was then referred to see infectious disease: CMV and TB Quantiferon were negative.  She notes that the ID team told her there was no clear evidence of an infectious process. TTE no vegetation. BL Bcx neg.  She notes that last week she developed a cough, but believes it to be allergy related. She notes that she has had an enlarged spleen since at least October 2018 revealed by a 07/31/17 US Abdomen. She notes some abdominal discomfort, and has felt that she gets full quickly after eating.  In the last year she intentionally dropped about 20 lbs of weight with change in her diet and avoiding most carbs. She notes that in the last 3 weeks, since she began feeling ill this month, she  has lost about 6-7 lbs.   She also notes that she has been having night sweats that have returned this month after a few months of night sweats last year which abated in August 2018. She notes no other signs of recent infection, and denies any travel outside of the country. She denies any concern of insect bites.   She notes that in [place] she was on iron pills for low Hgb, which increased her Hgb. She stopped taking after her Hgb normalized.  She notes that in March 2018 she had a bad stomach bug and in December 2017 she had influenza.  She notes that she has been under a lot of stress with the deaths of her mother and step-father in a couple weeks of one another.   The pt reports that she has seen rheumatology twice for her hand, knee and hip pain but was confirmed to have osteoarthritis as opposed to rheumatoid arthritis. She notes that the severity of her joint pains mirror the content of her diet. She notes that her joint have never been red or swollen.   She notes that she will be having an iron infusion on 01/03/18.  Of note prior to the patient's visit today, pt has had BM Bx completed on 12/27/17 with results revealing HYPERCELLULAR BONE MARROW FOR AGE WITH NUMEROUS ATYPICAL LYMPHOID AGGREGATES. - TRILINEAGE HEMATOPOIESIS. On 12/27/17 the patient's flow cytometry revealed  A MINOR MONOCLONAL B-CELL POPULATION IDENTIFIED.  Flow cytometric analysis shows a minor population of monoclonal B cells (17% of all lymphocytes) expressing pan B-cell antigens including CD20 with lack of CD5, CD10, CD25 or CD103 expression. Immunohistochemical stains were also performed and show that the lymphoid aggregates show a mixture of T and B cells with slight predominance of T cells. No significant cyclin D1, CD34, or TdT positivity is identified. The overall findings are limited but slightly favor involvement by a B-cell lymphoproliferative process. Consideration was given to marginal zone lymphoma and  splenic lymphoma especially in the presence of splenomegaly. Nonetheless, additional material (ie lymph node or tissue biopsy) is strongly recommended for further evaluation.  I discussed the BM Bx results with Dr Gari Crown -- overall BM Bx + clinical picture certainly concerning for SMZL with possible coombs neg AIHA and possible autoimmune phenomenon related to [place]?Marland Kitchen  Most recent lab results (12/30/17) of CBC  is as follows: all values are WNL except for WBC at 3.5k, RBC at 3.18, Hgb at 7.6, HCT at 24.4, MCV at 76.7, MCH at 23.9, MCHC at 31.1, RDW at 17.0. LDH 12/30/17 was at 616. Haptoglobin<10 -- suggestive of hemolysis. C-reactive protein 09/10/17 was elevated at 20.5.   On review of systems, pt reports low-grade fever, night sweats, some fatigue, tachycardia, occasional lightheadedness, and denies dizziness, weakness, noticing any enlarged lymph nodes, changes in bowel habits, abdominal pains, abnormal vaginal discharge, and any other symptoms.   On PMHx the pt reports high blood pressure and degenerative arthritis. On Family Hx the pt reports her father had rheumatoid arthritis, her mother had kidney disease.  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 The patient's last visit with [place] was on 09/25/2019. The pt reports that she is doing well overall.  The pt reports that she has been feeling well and denies any respiratory symptoms. She has noticed her BP creeping up due to some weight gain. Pt is beginning to work out again and is considering going back on Losartan. Pt is thinking of getting a Hep B vaccine booster due to her employer's concerns and is open to receiving the COVID19 vaccine when possible. She denies any reactions to previous Hep B vaccines. Pt has not been drinking much alcohol lately. Pt has continued to take Folic acid and a Vitamin B-complex.   Lab results today (10/23/19) of CBC w/diff  and CMP is as follows: all values are WNL except for RBC at 5.21, Hgb at 15.1, Glucose at 107, ALT at 72. 10/23/2019 LDH at 231  On review of systems, pt denies new joint pain, leg swelling, leg pain, fevers, chills, cough and any other symptoms.   MEDICAL HISTORY:  Past Medical History:  Diagnosis Date  . Arthritis   . Hypertension     SURGICAL HISTORY: No past surgical history on file.  SOCIAL HISTORY: Social History   Socioeconomic History  . Marital status: Single    Spouse name: Not on file  . Number of children: Not on file  . Years of education: Not on file  . Highest education level: Not on file  Occupational History  . Not on file  Tobacco Use  . Smoking status: Never Smoker  . Smokeless tobacco: Never Used  Substance and Sexual Activity  . Alcohol use: Not Currently    Alcohol/week: 0.0 standard drinks  . Drug use: Never  . Sexual activity: Not on file  Other Topics Concern  . Not on file  Social History Narrative  . Not on file   Social Determinants of Health   Financial Resource Strain:   . Difficulty of Paying Living Expenses: Not on file  Food Insecurity:   . Worried About Charity fundraiser in the Last Year: Not on file  . Ran Out of Food in the Last Year: Not on file  Transportation Needs:   . Lack of Transportation (Medical): Not on file  . Lack of Transportation (Non-Medical): Not on file  Physical Activity:   . Days of Exercise per Week: Not on file  . Minutes of Exercise per Session: Not on file  Stress:   . Feeling of Stress : Not on file  Social Connections:   . Frequency of Communication with Friends and Family: Not on file  . Frequency of Social Gatherings with Friends and Family: Not on file  . Attends Religious Services: Not on file  . Active Member of Clubs or Organizations: Not on file  . Attends Archivist Meetings: Not on file  . Marital Status: Not on file  Intimate Partner Violence:   . Fear of Current or  Ex-Partner: Not on file  . Emotionally Abused: Not on file  . Physically Abused: Not on file  . Sexually Abused: Not on file    FAMILY HISTORY: Family History  Problem Relation Age of Onset  . Diabetes Mother   . Hyperlipidemia Mother   . Hypertension Mother     ALLERGIES:  is allergic to biaxin [clarithromycin].  MEDICATIONS:  Current Outpatient Medications  Medication Sig Dispense Refill  . folic acid (FOLVITE) [id number] MCG tablet Take 800 mcg by mouth daily.     . valACYclovir (VALTREX) 1000 MG tablet Take 1 tablet (1,000 mg total) by mouth 2 (two) times daily. 20 tablet 0  . vitamin B-12 (CYANOCOBALAMIN) 1000 MCG tablet Take 1,000 mcg by mouth daily.     No current facility-administered medications for this visit.   Facility-Administered Medications Ordered in Other Visits  Medication Dose Route Frequency Provider Last Rate Last Admin  . heparin lock flush 100 unit/mL  500 Units Intracatheter Once PRN Brunetta Genera, MD      . methylPREDNISolone sodium succinate (SOLU-MEDROL) 125 mg/2 mL injection 125 mg  125 mg Intravenous Daily Brunetta Genera, MD   125 mg at 10/23/19 1020  . riTUXimab (RITUXAN) 600 mg in sodium chloride 0.9 % 190 mL infusion  375 mg/m2 (Treatment Plan Recorded) Intravenous Once Brunetta Genera, MD      . sodium chloride flush (NS) 0.9 % injection 10 mL  10 mL Intracatheter PRN Brunetta Genera, MD        REVIEW OF SYSTEMS:   A 10+ POINT REVIEW OF SYSTEMS WAS OBTAINED including neurology, dermatology, psychiatry, cardiac, respiratory, lymph, extremities, GI, GU, Musculoskeletal, constitutional, breasts, reproductive, HEENT.  All pertinent positives are noted in the HPI.  All others are negative.   PHYSICAL EXAMINATION: ECOG FS:1 - Symptomatic but completely ambulatory  Vitals:   10/23/19 0848  BP: (!) 147/81  Pulse: 80  Resp: 16  Temp: 98 F (36.7 C)  SpO2: 98%   Wt Readings from Last 3 Encounters:  10/23/19 149 lb 1.6 oz (67.6  kg)  09/25/19 151 lb 1.6 oz (68.5 kg)  07/27/19 148 lb 11.2 oz (67.4 kg)   Body mass index is 26.84 kg/m.    GENERAL:alert, in no acute distress and comfortable SKIN: no acute  rashes, no significant lesions EYES: conjunctiva are pink and non-injected, sclera anicteric OROPHARYNX: MMM, no exudates, no oropharyngeal erythema or ulceration NECK: supple, no JVD LYMPH:  no palpable lymphadenopathy in the cervical, axillary or inguinal regions LUNGS: clear to auscultation b/l with normal respiratory effort HEART: regular rate & rhythm ABDOMEN:  normoactive bowel sounds , non tender, not distended. No palpable hepatosplenomegaly.  Extremity: no pedal edema PSYCH: alert & oriented x 3 with fluent speech NEURO: no focal motor/sensory deficits  LABORATORY DATA:  I have reviewed the data as listed  . CBC Latest Ref Rng & Units 10/23/2019 09/25/2019 07/27/2019  WBC 4.0 - 10.5 K/uL 5.0 5.4 5.1  Hemoglobin 12.0 - 15.0 g/dL 15.1(H) 15.4(H) 14.8  Hematocrit 36.0 - 46.0 % 44.6 45.2 42.8  Platelets 150 - 400 K/uL 219 220 193   . CBC    Component Value Date/Time   WBC 5.0 10/23/2019 0832   RBC 5.21 (H) 10/23/2019 0832   HGB 15.1 (H) 10/23/2019 0832   HGB 14.5 07/03/2018 0835   HGB 12.8 09/10/2017 0801   HCT 44.6 10/23/2019 0832   HCT 39.4 09/10/2017 0801   PLT 219 10/23/2019 0832   PLT 183 07/03/2018 0835   PLT 144 (L) 09/10/2017 0801   MCV 85.6 10/23/2019 0832   MCV 79.0 (L) 09/10/2017 0801   MCH 29.0 10/23/2019 0832   MCHC 33.9 10/23/2019 0832   RDW 12.6 10/23/2019 0832   RDW 15.6 (H) 09/10/2017 0801   LYMPHSABS 1.1 10/23/2019 0832   LYMPHSABS 0.9 09/10/2017 0801   MONOABS 0.5 10/23/2019 0832   MONOABS 0.4 09/10/2017 0801   EOSABS 0.1 10/23/2019 0832   EOSABS 0.1 09/10/2017 0801   BASOSABS 0.0 10/23/2019 0832   BASOSABS 0.0 09/10/2017 0801    . CMP Latest Ref Rng & Units 10/23/2019 09/25/2019 07/27/2019  Glucose 70 - 99 mg/dL 107(H) 113(H) 116(H)  BUN 8 - 23 mg/dL 18 19 19    Creatinine 0.44 - 1.00 mg/dL 0.77 0.75 0.77  Sodium 135 - 145 mmol/L 140 141 141  Potassium 3.5 - 5.1 mmol/L 3.9 4.0 4.2  Chloride 98 - 111 mmol/L 108 107 106  CO2 22 - 32 mmol/L 22 23 25   Calcium 8.9 - 10.3 mg/dL 8.9 9.6 9.2  Total Protein 6.5 - 8.1 g/dL 6.7 6.9 6.7  Total Bilirubin 0.3 - 1.2 mg/dL 0.8 0.7 0.7  Alkaline Phos 38 - 126 U/L 74 80 78  AST 15 - 41 U/L 32 33 26  ALT 0 - 44 U/L 72(H) 66(H) 50(H)   . Lab Results  Component Value Date   LDH 231 (H) 10/23/2019     12/27/17 BM Bx:   12/27/17 Flow Cytometry:     RADIOGRAPHIC STUDIES: I have personally reviewed the radiological images as listed and agreed with the findings in the report. No results found.  ASSESSMENT & PLAN:   64 y.o. female with  1. S/p Pancytopenia (resolved) due to diagnosed Splenic marginal zone lymphoma. Patient appeared to be have significant constitutional symptoms . No evidence of rheumatological or infectious etiology for her fevers and night sweats. Constitutional symptoms now resolved.  2. S/p Splenomegaly - likely due to [place]. Marland Kitchenimproved on [place] abd. Clinical no palpable splenomegaly today.   04/18/18 US Abdomen revealed Splenomegaly with a length of 13.3 cm and a volume of 432 cc. By report, the spleen measured up to 15.1 cm in maximum dimension on the PET-CT from January 08, 2018 suggesting interval improvement.   3. Anemia - with  elevated LDH and low haptoglobin concerning for hemolysis. Coombs neg . Could be IgA driven Coombs neg AIHA. +ve reticulocytosis and Smear with some microspherocytes. No over urine hemosiderinuria. Partly anemia could also be from hypersplenism.  hgb normal now at 14.1  4. Low C4 ? Immune complex disease/autoimmunity related to lymphoma. Per rheumatology - no evidence of primary rheumatological disorder. No overt evidence of endocarditis.  5. h/o Fevers/chills/night sweat -- likely constitutional symptoms related to ?lymphoma - now resolved. 6. Thrombocytopenia  likely from splenomegaly vs lymphoma-- resolved. PLT resolved to 187k on 09/11/18 labs  PLAN:  -Discussed pt labwork today, 10/23/19; blood counts and chemistries look good, ALT is borderline high  -Discussed 10/23/2019 LDH is still slightly elevated at 231 -C8 was delayed due to possible COVID19 infection - no symptoms present-- subsequently ruled out. -Advised pt that there are no contraindications to getting a Hep B vaccine booster from a Lymphoma or Rituxan standpoint -Recommended pt receive the COVID19 vaccine when available  -Advised pt that there may be a reduced response to the COVID19 vaccine due to active treatment -Elevated LDH and ALT could be a sign of Fatty Liver - will continue to watch with labs  -The pt as no prohibitive toxicities from continuing delayed C8 of maintenance Rituxan at this time -Discussed again that the best time for routine dental cleanings would be two weeks before her treatment -Advised pt that if it has been 6-12 months or more since her last dose she may need to repeat both doses of the Shingrix vaccine -Continue Vitamin B-complex supplement -Will see back in 2 months with labs   FOLLOW UP: Follow-up as per schedule appointments for labs, MD visit and next dose of maintenance Rituxan on 12/21/2018   The total time spent in the appt was 20 minutes and more than 50% was on counseling and direct patient cares.  All of the patient's questions were answered with apparent satisfaction. The patient knows to call the clinic with any problems, questions or concerns.    Sullivan Lone MD [place] AAHIVMS [facility] Hematology/Oncology Physician [facility]  (Office):       [phone] (Work cell):  [phone] (Fax):           [phone]  10/23/2019 10:57 AM  I, Yevette Edwards, am acting as a scribe for Dr. Sullivan Lone.   .I have reviewed the above documentation for accuracy and completeness, and I agree with the above. Brunetta Genera MD

## 2019-10-23 ENCOUNTER — Other Ambulatory Visit: Payer: Self-pay

## 2019-10-23 ENCOUNTER — Inpatient Hospital Stay: Payer: 59

## 2019-10-23 ENCOUNTER — Inpatient Hospital Stay: Payer: 59 | Attending: Hematology and Oncology

## 2019-10-23 ENCOUNTER — Inpatient Hospital Stay (HOSPITAL_BASED_OUTPATIENT_CLINIC_OR_DEPARTMENT_OTHER): Payer: 59 | Admitting: Hematology

## 2019-10-23 ENCOUNTER — Telehealth: Payer: Self-pay | Admitting: Hematology

## 2019-10-23 VITALS — BP 147/81 | HR 80 | Temp 98.0°F | Resp 16 | Ht 62.5 in | Wt 149.1 lb

## 2019-10-23 VITALS — BP 136/82 | HR 81 | Temp 98.6°F | Resp 18

## 2019-10-23 DIAGNOSIS — Z5112 Encounter for antineoplastic immunotherapy: Secondary | ICD-10-CM | POA: Insufficient documentation

## 2019-10-23 DIAGNOSIS — C884 Extranodal marginal zone B-cell lymphoma of mucosa-associated lymphoid tissue [MALT-lymphoma]: Secondary | ICD-10-CM | POA: Insufficient documentation

## 2019-10-23 DIAGNOSIS — C8307 Small cell B-cell lymphoma, spleen: Secondary | ICD-10-CM

## 2019-10-23 DIAGNOSIS — Z7189 Other specified counseling: Secondary | ICD-10-CM

## 2019-10-23 LAB — CBC WITH DIFFERENTIAL/PLATELET
Abs Immature Granulocytes: 0.01 10*3/uL (ref 0.00–0.07)
Basophils Absolute: 0 10*3/uL (ref 0.0–0.1)
Basophils Relative: 1 %
Eosinophils Absolute: 0.1 10*3/uL (ref 0.0–0.5)
Eosinophils Relative: 3 %
HCT: 44.6 % (ref 36.0–46.0)
Hemoglobin: 15.1 g/dL — ABNORMAL HIGH (ref 12.0–15.0)
Immature Granulocytes: 0 %
Lymphocytes Relative: 21 %
Lymphs Abs: 1.1 10*3/uL (ref 0.7–4.0)
MCH: 29 pg (ref 26.0–34.0)
MCHC: 33.9 g/dL (ref 30.0–36.0)
MCV: 85.6 fL (ref 80.0–100.0)
Monocytes Absolute: 0.5 10*3/uL (ref 0.1–1.0)
Monocytes Relative: 11 %
Neutro Abs: 3.2 10*3/uL (ref 1.7–7.7)
Neutrophils Relative %: 64 %
Platelets: 219 10*3/uL (ref 150–400)
RBC: 5.21 MIL/uL — ABNORMAL HIGH (ref 3.87–5.11)
RDW: 12.6 % (ref 11.5–15.5)
WBC: 5 10*3/uL (ref 4.0–10.5)
nRBC: 0 % (ref 0.0–0.2)

## 2019-10-23 LAB — CMP (CANCER CENTER ONLY)
ALT: 72 U/L — ABNORMAL HIGH (ref 0–44)
AST: 32 U/L (ref 15–41)
Albumin: 4.6 g/dL (ref 3.5–5.0)
Alkaline Phosphatase: 74 U/L (ref 38–126)
Anion gap: 10 (ref 5–15)
BUN: 18 mg/dL (ref 8–23)
CO2: 22 mmol/L (ref 22–32)
Calcium: 8.9 mg/dL (ref 8.9–10.3)
Chloride: 108 mmol/L (ref 98–111)
Creatinine: 0.77 mg/dL (ref 0.44–1.00)
GFR, Est AFR Am: >60 mL/min (ref >60)
GFR, Estimated: >60 mL/min (ref >60)
Glucose, Bld: 107 mg/dL — ABNORMAL HIGH (ref 70–99)
Potassium: 3.9 mmol/L (ref 3.5–5.1)
Sodium: 140 mmol/L (ref 135–145)
Total Bilirubin: 0.8 mg/dL (ref 0.3–1.2)
Total Protein: 6.7 g/dL (ref 6.5–8.1)

## 2019-10-23 LAB — LACTATE DEHYDROGENASE: LDH: 231 U/L — ABNORMAL HIGH (ref 98–192)

## 2019-10-23 MED ORDER — HEPARIN SOD (PORK) LOCK FLUSH 100 UNIT/ML IV SOLN
500.0000 [IU] | Freq: Once | INTRAVENOUS | Status: DC | PRN
  Filled 2019-10-23: qty 5

## 2019-10-23 MED ORDER — ACETAMINOPHEN 325 MG PO TABS
650.0000 mg | ORAL_TABLET | Freq: Once | ORAL | Status: AC
  Administered 2019-10-23: 10:00:00 650 mg via ORAL

## 2019-10-23 MED ORDER — FAMOTIDINE 20 MG PO TABS
ORAL_TABLET | ORAL | Status: AC
  Filled 2019-10-23: qty 1

## 2019-10-23 MED ORDER — SODIUM CHLORIDE 0.9% FLUSH
10.0000 mL | INTRAVENOUS | Status: DC | PRN
  Filled 2019-10-23: qty 10

## 2019-10-23 MED ORDER — METHYLPREDNISOLONE SODIUM SUCC 125 MG IJ SOLR
125.0000 mg | Freq: Every day | INTRAMUSCULAR | Status: DC
  Administered 2019-10-23: 10:00:00 125 mg via INTRAVENOUS

## 2019-10-23 MED ORDER — FAMOTIDINE IN NACL 20-0.9 MG/50ML-% IV SOLN
INTRAVENOUS | Status: AC
  Filled 2019-10-23: qty 50

## 2019-10-23 MED ORDER — DIPHENHYDRAMINE HCL 25 MG PO CAPS
ORAL_CAPSULE | ORAL | Status: AC
  Filled 2019-10-23: qty 2

## 2019-10-23 MED ORDER — SODIUM CHLORIDE 0.9 % IV SOLN
Freq: Once | INTRAVENOUS | Status: AC
  Filled 2019-10-23: qty 250

## 2019-10-23 MED ORDER — DIPHENHYDRAMINE HCL 25 MG PO CAPS
50.0000 mg | ORAL_CAPSULE | Freq: Once | ORAL | Status: AC
  Administered 2019-10-23: 10:00:00 50 mg via ORAL

## 2019-10-23 MED ORDER — FAMOTIDINE 20 MG PO TABS
20.0000 mg | ORAL_TABLET | Freq: Once | ORAL | Status: AC
  Administered 2019-10-23: 10:00:00 20 mg via ORAL

## 2019-10-23 MED ORDER — DIPHENHYDRAMINE HCL 50 MG/ML IJ SOLN
INTRAMUSCULAR | Status: AC
  Filled 2019-10-23: qty 1

## 2019-10-23 MED ORDER — ACETAMINOPHEN 325 MG PO TABS
ORAL_TABLET | ORAL | Status: AC
  Filled 2019-10-23: qty 2

## 2019-10-23 MED ORDER — METHYLPREDNISOLONE SODIUM SUCC 125 MG IJ SOLR
INTRAMUSCULAR | Status: AC
  Filled 2019-10-23: qty 2

## 2019-10-23 MED ORDER — SODIUM CHLORIDE 0.9 % IV SOLN
375.0000 mg/m2 | Freq: Once | INTRAVENOUS | Status: AC
  Administered 2019-10-23: 11:00:00 600 mg via INTRAVENOUS
  Filled 2019-10-23: qty 50

## 2019-10-23 NOTE — Telephone Encounter (Signed)
F/u per scheduled appts per 1/15 los.

## 2019-10-23 NOTE — Patient Instructions (Signed)
[facility] °Discharge Instructions for Patients Receiving Chemotherapy ° °Today you received the following chemotherapy agents: Rituximab ° °To help prevent nausea and vomiting after your treatment, we encourage you to take your nausea medication as directed.  °  °If you develop nausea and vomiting that is not controlled by your nausea medication, call the clinic.  ° °BELOW ARE SYMPTOMS THAT SHOULD BE REPORTED IMMEDIATELY: °· *FEVER GREATER THAN 100.5 F °· *CHILLS WITH OR WITHOUT FEVER °· NAUSEA AND VOMITING THAT IS NOT CONTROLLED WITH YOUR NAUSEA MEDICATION °· *UNUSUAL SHORTNESS OF BREATH °· *UNUSUAL BRUISING OR BLEEDING °· TENDERNESS IN MOUTH AND THROAT WITH OR WITHOUT PRESENCE OF ULCERS °· *URINARY PROBLEMS °· *BOWEL PROBLEMS °· UNUSUAL RASH °Items with * indicate a potential emergency and should be followed up as soon as possible. ° °Feel free to call the clinic should you have any questions or concerns. The clinic phone number is [phone]. ° °Please show the CHEMO ALERT CARD at check-in to the Emergency Department and triage nurse. ° ° °

## 2019-10-30 MED FILL — LOSARTAN POTASSIUM 50 MG TA: 50 | 90 days supply | Qty: 90 | Fill #0

## 2019-12-21 ENCOUNTER — Inpatient Hospital Stay: Payer: 59

## 2019-12-21 ENCOUNTER — Inpatient Hospital Stay (HOSPITAL_BASED_OUTPATIENT_CLINIC_OR_DEPARTMENT_OTHER): Payer: 59 | Admitting: Hematology

## 2019-12-21 ENCOUNTER — Other Ambulatory Visit: Payer: Self-pay

## 2019-12-21 ENCOUNTER — Inpatient Hospital Stay: Payer: 59 | Attending: Hematology and Oncology

## 2019-12-21 VITALS — BP 150/80 | HR 90 | Temp 98.6°F | Resp 16

## 2019-12-21 VITALS — BP 155/85 | HR 80 | Temp 98.2°F | Resp 18 | Ht 62.5 in | Wt 149.3 lb

## 2019-12-21 DIAGNOSIS — Z5112 Encounter for antineoplastic immunotherapy: Secondary | ICD-10-CM | POA: Diagnosis not present

## 2019-12-21 DIAGNOSIS — D649 Anemia, unspecified: Secondary | ICD-10-CM | POA: Diagnosis not present

## 2019-12-21 DIAGNOSIS — C884 Extranodal marginal zone B-cell lymphoma of mucosa-associated lymphoid tissue [MALT-lymphoma]: Secondary | ICD-10-CM | POA: Insufficient documentation

## 2019-12-21 DIAGNOSIS — C8307 Small cell B-cell lymphoma, spleen: Secondary | ICD-10-CM | POA: Diagnosis not present

## 2019-12-21 DIAGNOSIS — Z7189 Other specified counseling: Secondary | ICD-10-CM

## 2019-12-21 LAB — CMP (CANCER CENTER ONLY)
ALT: 67 U/L — ABNORMAL HIGH (ref 0–44)
AST: 30 U/L (ref 15–41)
Albumin: 4.3 g/dL (ref 3.5–5.0)
Alkaline Phosphatase: 70 U/L (ref 38–126)
Anion gap: 7 (ref 5–15)
BUN: 19 mg/dL (ref 8–23)
CO2: 24 mmol/L (ref 22–32)
Calcium: 8.9 mg/dL (ref 8.9–10.3)
Chloride: 110 mmol/L (ref 98–111)
Creatinine: 0.8 mg/dL (ref 0.44–1.00)
GFR, Est AFR Am: >60 mL/min (ref >60)
GFR, Estimated: >60 mL/min (ref >60)
Glucose, Bld: 118 mg/dL — ABNORMAL HIGH (ref 70–99)
Potassium: 4.4 mmol/L (ref 3.5–5.1)
Sodium: 141 mmol/L (ref 135–145)
Total Bilirubin: 0.8 mg/dL (ref 0.3–1.2)
Total Protein: 6.4 g/dL — ABNORMAL LOW (ref 6.5–8.1)

## 2019-12-21 LAB — CBC WITH DIFFERENTIAL/PLATELET
Abs Immature Granulocytes: 0.01 10*3/uL (ref 0.00–0.07)
Basophils Absolute: 0 10*3/uL (ref 0.0–0.1)
Basophils Relative: 1 %
Eosinophils Absolute: 0.1 10*3/uL (ref 0.0–0.5)
Eosinophils Relative: 3 %
HCT: 44.5 % (ref 36.0–46.0)
Hemoglobin: 15.2 g/dL — ABNORMAL HIGH (ref 12.0–15.0)
Immature Granulocytes: 0 %
Lymphocytes Relative: 23 %
Lymphs Abs: 1.1 10*3/uL (ref 0.7–4.0)
MCH: 29.6 pg (ref 26.0–34.0)
MCHC: 34.2 g/dL (ref 30.0–36.0)
MCV: 86.6 fL (ref 80.0–100.0)
Monocytes Absolute: 0.5 10*3/uL (ref 0.1–1.0)
Monocytes Relative: 11 %
Neutro Abs: 3.1 10*3/uL (ref 1.7–7.7)
Neutrophils Relative %: 62 %
Platelets: 199 10*3/uL (ref 150–400)
RBC: 5.14 MIL/uL — ABNORMAL HIGH (ref 3.87–5.11)
RDW: 12.8 % (ref 11.5–15.5)
WBC: 4.9 10*3/uL (ref 4.0–10.5)
nRBC: 0 % (ref 0.0–0.2)

## 2019-12-21 LAB — LACTATE DEHYDROGENASE: LDH: 240 U/L — ABNORMAL HIGH (ref 98–192)

## 2019-12-21 MED ORDER — FAMOTIDINE 20 MG PO TABS
ORAL_TABLET | ORAL | Status: AC
  Filled 2019-12-21: qty 1

## 2019-12-21 MED ORDER — DIPHENHYDRAMINE HCL 25 MG PO CAPS
50.0000 mg | ORAL_CAPSULE | Freq: Once | ORAL | Status: AC
  Administered 2019-12-21: 50 mg via ORAL

## 2019-12-21 MED ORDER — METHYLPREDNISOLONE SODIUM SUCC 125 MG IJ SOLR
125.0000 mg | Freq: Every day | INTRAMUSCULAR | Status: DC
  Administered 2019-12-21: 125 mg via INTRAVENOUS

## 2019-12-21 MED ORDER — DIPHENHYDRAMINE HCL 25 MG PO CAPS
ORAL_CAPSULE | ORAL | Status: AC
  Filled 2019-12-21: qty 2

## 2019-12-21 MED ORDER — SODIUM CHLORIDE 0.9 % IV SOLN
375.0000 mg/m2 | Freq: Once | INTRAVENOUS | Status: AC
  Administered 2019-12-21: 600 mg via INTRAVENOUS
  Filled 2019-12-21: qty 50

## 2019-12-21 MED ORDER — FAMOTIDINE 20 MG PO TABS
20.0000 mg | ORAL_TABLET | Freq: Once | ORAL | Status: AC
  Administered 2019-12-21: 20 mg via ORAL

## 2019-12-21 MED ORDER — ACETAMINOPHEN 325 MG PO TABS
ORAL_TABLET | ORAL | Status: AC
  Filled 2019-12-21: qty 2

## 2019-12-21 MED ORDER — METHYLPREDNISOLONE SODIUM SUCC 125 MG IJ SOLR
INTRAMUSCULAR | Status: AC
  Filled 2019-12-21: qty 2

## 2019-12-21 MED ORDER — SODIUM CHLORIDE 0.9 % IV SOLN
Freq: Once | INTRAVENOUS | Status: AC
  Filled 2019-12-21: qty 250

## 2019-12-21 MED ORDER — ACETAMINOPHEN 325 MG PO TABS
650.0000 mg | ORAL_TABLET | Freq: Once | ORAL | Status: AC
  Administered 2019-12-21: 650 mg via ORAL

## 2019-12-21 NOTE — Patient Instructions (Signed)
Emily Foley Discharge Instructions for Patients Receiving Chemotherapy  Today you received the following chemotherapy agents:  rituximab  To help prevent nausea and vomiting after your treatment, we encourage you to take your nausea medication as prescribed.   If you develop nausea and vomiting that is not controlled by your nausea medication, call the clinic.   BELOW ARE SYMPTOMS THAT SHOULD BE REPORTED IMMEDIATELY:  *FEVER GREATER THAN 100.5 F  *CHILLS WITH OR WITHOUT FEVER  NAUSEA AND VOMITING THAT IS NOT CONTROLLED WITH YOUR NAUSEA MEDICATION  *UNUSUAL SHORTNESS OF BREATH  *UNUSUAL BRUISING OR BLEEDING  TENDERNESS IN MOUTH AND THROAT WITH OR WITHOUT PRESENCE OF ULCERS  *URINARY PROBLEMS  *BOWEL PROBLEMS  UNUSUAL RASH Items with * indicate a potential emergency and should be followed up as soon as possible.  Feel free to call the clinic should you have any questions or concerns. The clinic phone number is [phone].  Please show the [place] at check-in to the Emergency Department and triage nurse.

## 2019-12-21 NOTE — Progress Notes (Signed)
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 She is accompanied today by three members of her family. The pt reports that she is doing well overall.   The pt notes that on March 6 she developed a low-grade fever and continues to have a fever. She has kept her fever at [facility] with Tylenol every 6 hours. She notes that today she has felt the least feverish since March 6.  She saw her PCP Dr. Hulan Foley who ruled out influenza and UTI. She has also seen rheumatology over the last couple weeks and had a rheumatological workup that did not show overt evidence of a primary autoimmune condition. She was noted to be neg for ANA, RF and CCP Ab. Interesting was noted to have low C4 levels.  She notes that her resting HR has been elevated and describes her heart rate as tachycardic. She had an ECHO on 12/26/17 which showed a normal EF, and was then referred to see infectious disease: CMV and TB Quantiferon were negative.  She notes that the ID team told her there was no clear evidence of an infectious process. TTE no vegetation. BL Bcx neg.  She notes that last week she developed a cough, but believes it to be allergy related. She notes that she has had an enlarged spleen since at least October 2018 revealed by a 07/31/17 US Abdomen. She notes some abdominal discomfort, and has felt that she gets full quickly after eating.  In the last year she intentionally dropped about 20 lbs of weight with change in her diet and avoiding most carbs. She notes that in the last 3 weeks, since she began feeling ill this month, she  has lost about 6-7 lbs.   She also notes that she has been having night sweats that have returned this month after a few months of night sweats last year which abated in August 2018. She notes no other signs of recent infection, and denies any travel outside of the country. She denies any concern of insect bites.   She notes that in [place] she was on iron pills for low Hgb, which increased her Hgb. She stopped taking after her Hgb normalized.  She notes that in March 2018 she had a bad stomach bug and in December 2017 she had influenza.  She notes that she has been under a lot of stress with the deaths of her mother and step-father in a couple weeks of one another.   The pt reports that she has seen rheumatology twice for her hand, knee and hip pain but was confirmed to have osteoarthritis as opposed to rheumatoid arthritis. She notes that the severity of her joint pains mirror the content of her diet. She notes that her joint have never been red or swollen.   She notes that she will be having an iron infusion on 01/03/18.  Of note prior to the patient's visit today, pt has had BM Bx completed on 12/27/17 with results revealing HYPERCELLULAR BONE MARROW FOR AGE WITH NUMEROUS ATYPICAL LYMPHOID AGGREGATES. - TRILINEAGE HEMATOPOIESIS. On 12/27/17 the patient's flow cytometry revealed  A MINOR MONOCLONAL B-CELL POPULATION IDENTIFIED.  Flow cytometric analysis shows a minor population of monoclonal B cells (17% of all lymphocytes) expressing pan B-cell antigens including CD20 with lack of CD5, CD10, CD25 or CD103 expression. Immunohistochemical stains were also performed and show that the lymphoid aggregates show a mixture of T and B cells with slight predominance of T cells. No significant cyclin D1, CD34, or TdT positivity is identified. The overall findings are limited but slightly favor involvement by a B-cell lymphoproliferative process. Consideration was given to marginal zone lymphoma and  splenic lymphoma especially in the presence of splenomegaly. Nonetheless, additional material (ie lymph node or tissue biopsy) is strongly recommended for further evaluation.  I discussed the BM Bx results with Dr Emily Foley -- overall BM Bx + clinical picture certainly concerning for SMZL with possible coombs neg AIHA and possible autoimmune phenomenon related to [place]?Marland Kitchen  Most recent lab results (12/30/17) of CBC  is as follows: all values are WNL except for WBC at 3.5k, RBC at 3.18, Hgb at 7.6, HCT at 24.4, MCV at 76.7, MCH at 23.9, MCHC at 31.1, RDW at 17.0. LDH 12/30/17 was at 616. Haptoglobin<10 -- suggestive of hemolysis. C-reactive protein 09/10/17 was elevated at 20.5.   On review of systems, pt reports low-grade fever, night sweats, some fatigue, tachycardia, occasional lightheadedness, and denies dizziness, weakness, noticing any enlarged lymph nodes, changes in bowel habits, abdominal pains, abnormal vaginal discharge, and any other symptoms.   On PMHx the pt reports high blood pressure and degenerative arthritis. On Family Hx the pt reports her father had rheumatoid arthritis, her mother had kidney disease.  Interval History:  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 The patient's last visit with [place] was on 10/23/2019. The pt reports that she is doing well overall.  The pt reports that her BP began to creep up so she restarted taking Losartan. Pt has been trying to Foley weight slowly to improve her BP. She has also noticed that her back has been hurting more often. The pain is improved after taking Celebrex and does not radiate into her legs. She reports a numbness on the tip of her tongue and that food has also been tasting differently to her. Pt had a sore down the side of her tongue, which has resolved.   Pt has received both doses of the COVID19 vaccine.    Lab results today (12/21/19) of CBC w/diff and CMP is as  follows: all values are WNL except for RBC at 5.14, Hgb at 15.2, Glucose at 118, Total Protein at 6.4, ALT at 67. 12/21/2019 LDH at 240   On review of systems, pt reports back pain, tongue numbness, dysgeusia and denies mouth sores, fevers, chills, night sweats, specific joint pain/swelling, abdominal pain and any other symptoms.   MEDICAL HISTORY:  Past Medical History:  Diagnosis Date  . Arthritis   . Hypertension     SURGICAL HISTORY: No past surgical history on file.  SOCIAL HISTORY: Social History   Socioeconomic History  . Marital status: Single    Spouse name: Not on file  . Number of children: Not on file  . Years of education: Not on file  . Highest education level: Not on file  Occupational History  . Not on file  Tobacco Use  . Smoking status: Never Smoker  . Smokeless tobacco: Never Used  Substance and Sexual Activity  . Alcohol use: Not Currently  Alcohol/week: 0.0 standard drinks  . Drug use: Never  . Sexual activity: Not on file  Other Topics Concern  . Not on file  Social History Narrative  . Not on file   Social Determinants of Health   Financial Resource Strain:   . Difficulty of Paying Living Expenses:   Food Insecurity:   . Worried About Charity fundraiser in the Last Year:   . Arboriculturist in the Last Year:   Transportation Needs:   . Film/video editor (Medical):   Marland Kitchen Lack of Transportation (Non-Medical):   Physical Activity:   . Days of Exercise per Week:   . Minutes of Exercise per Session:   Stress:   . Feeling of Stress :   Social Connections:   . Frequency of Communication with Friends and Family:   . Frequency of Social Gatherings with Friends and Family:   . Attends Religious Services:   . Active Member of Clubs or Organizations:   . Attends Archivist Meetings:   Marland Kitchen Marital Status:   Intimate Partner Violence:   . Fear of Current or Ex-Partner:   . Emotionally Abused:   Marland Kitchen Physically Abused:   . Sexually  Abused:     FAMILY HISTORY: Family History  Problem Relation Age of Onset  . Diabetes Mother   . Hyperlipidemia Mother   . Hypertension Mother     ALLERGIES:  is allergic to biaxin [clarithromycin].  MEDICATIONS:  Current Outpatient Medications  Medication Sig Dispense Refill  . folic acid (FOLVITE) [id number] MCG tablet Take 800 mcg by mouth daily.     Marland Kitchen losartan (COZAAR) 50 MG tablet Take 50 mg by mouth daily.    . valACYclovir (VALTREX) 1000 MG tablet Take 1 tablet (1,000 mg total) by mouth 2 (two) times daily. 20 tablet 0  . vitamin B-12 (CYANOCOBALAMIN) 1000 MCG tablet Take 1,000 mcg by mouth daily.     No current facility-administered medications for this visit.    REVIEW OF SYSTEMS:   A 10+ POINT REVIEW OF SYSTEMS WAS OBTAINED including neurology, dermatology, psychiatry, cardiac, respiratory, lymph, extremities, GI, GU, Musculoskeletal, constitutional, breasts, reproductive, HEENT.  All pertinent positives are noted in the HPI.  All others are negative.   PHYSICAL EXAMINATION: ECOG FS:1 - Symptomatic but completely ambulatory  Vitals:   12/21/19 0912  BP: (!) 155/85  Pulse: 80  Resp: 18  Temp: 98.2 F (36.8 C)  SpO2: 98%   Wt Readings from Last 3 Encounters:  12/21/19 149 lb 4.8 oz (67.7 kg)  10/23/19 149 lb 1.6 oz (67.6 kg)  09/25/19 151 lb 1.6 oz (68.5 kg)   Body mass index is 26.87 kg/m.    GENERAL:alert, in no acute distress and comfortable SKIN: no acute rashes, no significant lesions EYES: conjunctiva are pink and non-injected, sclera anicteric OROPHARYNX: MMM, no exudates, no oropharyngeal erythema or ulceration NECK: supple, no JVD LYMPH:  no palpable lymphadenopathy in the cervical, axillary or inguinal regions LUNGS: clear to auscultation b/l with normal respiratory effort HEART: regular rate & rhythm ABDOMEN:  normoactive bowel sounds , non tender, not distended. No palpable hepatomegaly. Spleen just palpable.  Extremity: no pedal edema PSYCH:  alert & oriented x 3 with fluent speech NEURO: no focal motor/sensory deficits  LABORATORY DATA:  I have reviewed the data as listed  . CBC Latest Ref Rng & Units 12/21/2019 10/23/2019 09/25/2019  WBC 4.0 - 10.5 K/uL 4.9 5.0 5.4  Hemoglobin 12.0 - 15.0 g/dL 15.2(H) 15.1(H)  15.4(H)  Hematocrit 36.0 - 46.0 % 44.5 44.6 45.2  Platelets 150 - 400 K/uL 199 219 220   . CBC    Component Value Date/Time   WBC 4.9 12/21/2019 0847   RBC 5.14 (H) 12/21/2019 0847   HGB 15.2 (H) 12/21/2019 0847   HGB 14.5 07/03/2018 0835   HGB 12.8 09/10/2017 0801   HCT 44.5 12/21/2019 0847   HCT 39.4 09/10/2017 0801   PLT 199 12/21/2019 0847   PLT 183 07/03/2018 0835   PLT 144 (L) 09/10/2017 0801   MCV 86.6 12/21/2019 0847   MCV 79.0 (L) 09/10/2017 0801   MCH 29.6 12/21/2019 0847   MCHC 34.2 12/21/2019 0847   RDW 12.8 12/21/2019 0847   RDW 15.6 (H) 09/10/2017 0801   LYMPHSABS 1.1 12/21/2019 0847   LYMPHSABS 0.9 09/10/2017 0801   MONOABS 0.5 12/21/2019 0847   MONOABS 0.4 09/10/2017 0801   EOSABS 0.1 12/21/2019 0847   EOSABS 0.1 09/10/2017 0801   BASOSABS 0.0 12/21/2019 0847   BASOSABS 0.0 09/10/2017 0801    . CMP Latest Ref Rng & Units 12/21/2019 10/23/2019 09/25/2019  Glucose 70 - 99 mg/dL 118(H) 107(H) 113(H)  BUN 8 - 23 mg/dL 19 18 19   Creatinine 0.44 - 1.00 mg/dL 0.80 0.77 0.75  Sodium 135 - 145 mmol/L 141 140 141  Potassium 3.5 - 5.1 mmol/L 4.4 3.9 4.0  Chloride 98 - 111 mmol/L 110 108 107  CO2 22 - 32 mmol/L 24 22 23   Calcium 8.9 - 10.3 mg/dL 8.9 8.9 9.6  Total Protein 6.5 - 8.1 g/dL 6.4(L) 6.7 6.9  Total Bilirubin 0.3 - 1.2 mg/dL 0.8 0.8 0.7  Alkaline Phos 38 - 126 U/L 70 74 80  AST 15 - 41 U/L 30 32 33  ALT 0 - 44 U/L 67(H) 72(H) 66(H)   . Lab Results  Component Value Date   LDH 240 (H) 12/21/2019     12/27/17 BM Bx:   12/27/17 Flow Cytometry:     RADIOGRAPHIC STUDIES: I have personally reviewed the radiological images as listed and agreed with the findings in the  report. No results found.  ASSESSMENT & PLAN:   64 y.o. female with  1. S/p Pancytopenia (resolved) due to diagnosed Splenic marginal zone lymphoma. Patient appeared to be have significant constitutional symptoms . No evidence of rheumatological or infectious etiology for her fevers and night sweats. Constitutional symptoms now resolved.  2. S/p Splenomegaly - likely due to [place]. Marland Kitchenimproved on [place] abd. Clinical no palpable splenomegaly today.   04/18/18 US Abdomen revealed Splenomegaly with a length of 13.3 cm and a volume of 432 cc. By report, the spleen measured up to 15.1 cm in maximum dimension on the PET-CT from January 08, 2018 suggesting interval improvement.   3. Anemia - with elevated LDH and low haptoglobin concerning for hemolysis. Coombs neg . Could be IgA driven Coombs neg AIHA. +ve reticulocytosis and Smear with some microspherocytes. No over urine hemosiderinuria. Partly anemia could also be from hypersplenism.  hgb normal now at 14.1  4. Low C4 ? Immune complex disease/autoimmunity related to lymphoma. Per rheumatology - no evidence of primary rheumatological disorder. No overt evidence of endocarditis.  5. h/o Fevers/chills/night sweat -- likely constitutional symptoms related to ?lymphoma - now resolved. 6. Thrombocytopenia likely from splenomegaly vs lymphoma-- resolved. PLT resolved to 187k on 09/11/18 labs  PLAN:  -Discussed pt labwork today, 12/21/19; blood counts are normal, blood chemistries look good, ALT chronically elevated -Discussed 12/21/2019 LDH at 240, still  elevated but no acute increase -Elevated LDH and ALT could be a sign of Fatty Liver  -The pt shows no clinical or lab evidence of Splenic Marginal Zone Lymphoma progression at this time. -The pt as no prohibitive toxicities from continuing C9 of maintenance Rituxan at this time -Plan to complete at least two years of maintenance treatment before discontinuing -Continue Vitamin B-complex supplement -Plan to  repeat Abd [place] in 6 weeks to r/o fatty liver  -Will see back in 8 weeks with labs    FOLLOW UP: [place] abd in 6 weeks RTC for next dose of maintenance Rituxan, labs and MD visit as scheduled on 5/14   The total time spent in the appt was 30 minutes and more than 50% was on counseling and direct patient cares, planing and referring treatment orders and co-ordinating cares with the infusion team  All of the patient's questions were answered with apparent satisfaction. The patient knows to call the clinic with any problems, questions or concerns.    Sullivan Lone MD [place] AAHIVMS [facility] Hematology/Oncology Physician [facility]  (Office):       [phone] (Work cell):  [phone] (Fax):           [phone]  12/21/2019 9:56 AM  I, Yevette Edwards, am acting as a scribe for Dr. Sullivan Lone.   .I have reviewed the above documentation for accuracy and completeness, and I agree with the above. Brunetta Genera MD

## 2020-01-01 ENCOUNTER — Encounter: Payer: Self-pay | Admitting: Hematology

## 2020-01-04 ENCOUNTER — Other Ambulatory Visit: Payer: Self-pay | Admitting: *Deleted

## 2020-01-04 MED ORDER — VALACYCLOVIR HCL 1 G PO TABS: 1000.0000 mg | ORAL_TABLET | Freq: Two times a day (BID) | ORAL | 0 refills | Status: DC

## 2020-01-04 MED FILL — valACYclovir HCL 1 GM TABS: 1 | 10 days supply | Qty: 20 | Fill #0

## 2020-01-14 ENCOUNTER — Other Ambulatory Visit: Payer: Self-pay | Admitting: Hematology

## 2020-01-21 MED FILL — LOSARTAN POTASSIUM 50 MG TA: 50 | 90 days supply | Qty: 90 | Fill #0

## 2020-02-08 ENCOUNTER — Other Ambulatory Visit: Payer: Self-pay

## 2020-02-08 ENCOUNTER — Ambulatory Visit (HOSPITAL_COMMUNITY)
Admission: RE | Admit: 2020-02-08 | Discharge: 2020-02-08 | Disposition: A | Discharge status: Home / Self Care | Payer: 59 | Source: Ambulatory Visit | Attending: Hematology | Admitting: Hematology

## 2020-02-08 DIAGNOSIS — K760 Fatty (change of) liver, not elsewhere classified: Secondary | ICD-10-CM | POA: Diagnosis not present

## 2020-02-08 DIAGNOSIS — Z5112 Encounter for antineoplastic immunotherapy: Secondary | ICD-10-CM | POA: Diagnosis not present

## 2020-02-08 DIAGNOSIS — C8307 Small cell B-cell lymphoma, spleen: Secondary | ICD-10-CM | POA: Insufficient documentation

## 2020-02-08 DIAGNOSIS — R161 Splenomegaly, not elsewhere classified: Secondary | ICD-10-CM | POA: Diagnosis not present

## 2020-02-08 DIAGNOSIS — C884 Extranodal marginal zone B-cell lymphoma of mucosa-associated lymphoid tissue [MALT-lymphoma]: Secondary | ICD-10-CM | POA: Diagnosis not present

## 2020-02-17 NOTE — Progress Notes (Addendum)
Patient's insurance requires change from Rituxan to Ruxience for rituximab infusions. Infusion charge notified for appointment adjustments since patient will need to converted back to standard first infusion rate for first treatment. If tolerated, she may be converted back to rapid Ruxience order.   Update: Rituxan brand was approved by insurance. No change in medication or infusion rate required at this time.   Demetrius Charity, PharmD, BCPS, [place] [facility] Pharmacist Pharmacy Phone: [phone] 02/17/2020

## 2020-02-18 NOTE — Progress Notes (Signed)
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 She is accompanied today by three members of her family. The pt reports that she is doing well overall.   The pt notes that on March 6 she developed a low-grade fever and continues to have a fever. She has kept her fever at [place] with Tylenol every 6 hours. She notes that today she has felt the least feverish since March 6.  She saw her PCP Dr. Hulan Fess who ruled out influenza and UTI. She has also seen rheumatology over the last couple weeks and had a rheumatological workup that did not show overt evidence of a primary autoimmune condition. She was noted to be neg for ANA, RF and CCP Ab. Interesting was noted to have low C4 levels.  She notes that her resting HR has been elevated and describes her heart rate as tachycardic. She had an ECHO on 12/26/17 which showed a normal EF, and was then referred to see infectious disease: CMV and TB Quantiferon were negative.  She notes that the ID team told her there was no clear evidence of an infectious process. TTE no vegetation. BL Bcx neg.  She notes that last week she developed a cough, but believes it to be allergy related. She notes that she has had an enlarged spleen since at least October 2018 revealed by a 07/31/17 US Abdomen. She notes some abdominal discomfort, and has felt that she gets full quickly after eating.  In the last year she intentionally dropped about 20 lbs of weight with change in her diet and avoiding most carbs. She notes that in the last 3 weeks, since she began feeling ill this month, she  has lost about 6-7 lbs.   She also notes that she has been having night sweats that have returned this month after a few months of night sweats last year which abated in August 2018. She notes no other signs of recent infection, and denies any travel outside of the country. She denies any concern of insect bites.   She notes that in [place] she was on iron pills for low Hgb, which increased her Hgb. She stopped taking after her Hgb normalized.  She notes that in March 2018 she had a bad stomach bug and in December 2017 she had influenza.  She notes that she has been under a lot of stress with the deaths of her mother and step-father in a couple weeks of one another.   The pt reports that she has seen rheumatology twice for her hand, knee and hip pain but was confirmed to have osteoarthritis as opposed to rheumatoid arthritis. She notes that the severity of her joint pains mirror the content of her diet. She notes that her joint have never been red or swollen.   She notes that she will be having an iron infusion on 01/03/18.  Of note prior to the patient's visit today, pt has had BM Bx completed on 12/27/17 with results revealing HYPERCELLULAR BONE MARROW FOR AGE WITH NUMEROUS ATYPICAL LYMPHOID AGGREGATES. - TRILINEAGE HEMATOPOIESIS. On 12/27/17 the patient's flow cytometry revealed  A MINOR MONOCLONAL B-CELL POPULATION IDENTIFIED.  Flow cytometric analysis shows a minor population of monoclonal B cells (17% of all lymphocytes) expressing pan B-cell antigens including CD20 with lack of CD5, CD10, CD25 or CD103 expression. Immunohistochemical stains were also performed and show that the lymphoid aggregates show a mixture of T and B cells with slight predominance of T cells. No significant cyclin D1, CD34, or TdT positivity is identified. The overall findings are limited but slightly favor involvement by a B-cell lymphoproliferative process. Consideration was given to marginal zone lymphoma and  splenic lymphoma especially in the presence of splenomegaly. Nonetheless, additional material (ie lymph node or tissue biopsy) is strongly recommended for further evaluation.  I discussed the BM Bx results with Dr Gari Crown -- overall BM Bx + clinical picture certainly concerning for SMZL with possible coombs neg AIHA and possible autoimmune phenomenon related to [place]?Marland Kitchen  Most recent lab results (12/30/17) of CBC  is as follows: all values are WNL except for WBC at 3.5k, RBC at 3.18, Hgb at 7.6, HCT at 24.4, MCV at 76.7, MCH at 23.9, MCHC at 31.1, RDW at 17.0. LDH 12/30/17 was at 616. Haptoglobin<10 -- suggestive of hemolysis. C-reactive protein 09/10/17 was elevated at 20.5.   On review of systems, pt reports low-grade fever, night sweats, some fatigue, tachycardia, occasional lightheadedness, and denies dizziness, weakness, noticing any enlarged lymph nodes, changes in bowel habits, abdominal pains, abnormal vaginal discharge, and any other symptoms.   On PMHx the pt reports high blood pressure and degenerative arthritis. On Family Hx the pt reports her father had rheumatoid arthritis, her mother had kidney disease.  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 The patient's last visit with [place] was on 12/21/19. The pt reports that she is doing well overall.  The pt reports she is good. Pt has been working and spending time with friends and family. She has been taking valtrex for an infection spt on her lip. She has not been having drenching night sweats but has been waking up a little sweaty.   Of note since the patient's last visit, pt has had US Abdomen Complete ([id number]) completed on 02/08/20 with results revealing "Interval resolution of splenomegaly since previous study. No splenic masses identified. Diffuse hepatic steatosis. Stable small gallbladder polyps. No evidence of cholelithiasis or biliary ductal  dilatation."  Lab results today (02/19/20) of CBC w/diff and CMP is as follows: all values are WNL except for Glucose at 117, ALT at 74 02/19/20 of LDH at 222  On review of systems, pt denies new joint pain, swelling, fever, chills, night sweats, abdominal pain and any other symptoms.   MEDICAL HISTORY:  Past Medical History:  Diagnosis Date  . Arthritis   . Hypertension     SURGICAL HISTORY: No past surgical history on file.  SOCIAL HISTORY: Social History   Socioeconomic History  . Marital status: Single    Spouse name: Not on file  . Number of children: Not on file  . Years of education: Not on file  . Highest education level: Not on file  Occupational History  . Not on file  Tobacco Use  . Smoking status: Never Smoker  . Smokeless tobacco: Never Used  Substance and Sexual Activity  . Alcohol use: Not Currently    Alcohol/week: 0.0 standard drinks  . Drug use: Never  . Sexual activity: Not on file  Other Topics Concern  . Not  on file  Social History Narrative  . Not on file   Social Determinants of Health   Financial Resource Strain:   . Difficulty of Paying Living Expenses:   Food Insecurity:   . Worried About Charity fundraiser in the Last Year:   . Arboriculturist in the Last Year:   Transportation Needs:   . Film/video editor (Medical):   Marland Kitchen Lack of Transportation (Non-Medical):   Physical Activity:   . Days of Exercise per Week:   . Minutes of Exercise per Session:   Stress:   . Feeling of Stress :   Social Connections:   . Frequency of Communication with Friends and Family:   . Frequency of Social Gatherings with Friends and Family:   . Attends Religious Services:   . Active Member of Clubs or Organizations:   . Attends Archivist Meetings:   Marland Kitchen Marital Status:   Intimate Partner Violence:   . Fear of Current or Ex-Partner:   . Emotionally Abused:   Marland Kitchen Physically Abused:   . Sexually Abused:     FAMILY HISTORY: Family  History  Problem Relation Age of Onset  . Diabetes Mother   . Hyperlipidemia Mother   . Hypertension Mother     ALLERGIES:  is allergic to biaxin [clarithromycin].  MEDICATIONS:  Current Outpatient Medications  Medication Sig Dispense Refill  . folic acid (FOLVITE) [id number] MCG tablet Take 800 mcg by mouth daily.     Marland Kitchen losartan (COZAAR) 50 MG tablet Take 50 mg by mouth daily.    . valACYclovir (VALTREX) 1000 MG tablet Take 1 tablet (1,000 mg total) by mouth 2 (two) times daily. 20 tablet 0  . vitamin B-12 (CYANOCOBALAMIN) 1000 MCG tablet Take 1,000 mcg by mouth daily.     No current facility-administered medications for this visit.    REVIEW OF SYSTEMS:   A 10+ POINT REVIEW OF SYSTEMS WAS OBTAINED including neurology, dermatology, psychiatry, cardiac, respiratory, lymph, extremities, GI, GU, Musculoskeletal, constitutional, breasts, reproductive, HEENT.  All pertinent positives are noted in the HPI.  All others are negative.   PHYSICAL EXAMINATION: ECOG FS:1 - Symptomatic but completely ambulatory  Vitals:   02/19/20 0903  BP: (!) 151/80  Pulse: 75  Resp: 18  Temp: 98.3 F (36.8 C)  SpO2: 100%   Wt Readings from Last 3 Encounters:  02/19/20 148 lb 9.6 oz (67.4 kg)  12/21/19 149 lb 4.8 oz (67.7 kg)  10/23/19 149 lb 1.6 oz (67.6 kg)   Body mass index is 26.75 kg/m.    GENERAL:alert, in no acute distress and comfortable SKIN: no acute rashes, no significant lesions EYES: conjunctiva are pink and non-injected, sclera anicteric OROPHARYNX: MMM, no exudates, no oropharyngeal erythema or ulceration NECK: supple, no JVD LYMPH:  no palpable lymphadenopathy in the cervical, axillary or inguinal regions LUNGS: clear to auscultation b/l with normal respiratory effort HEART: regular rate & rhythm ABDOMEN:  normoactive bowel sounds , non tender, not distended. Extremity: no pedal edema PSYCH: alert & oriented x 3 with fluent speech NEURO: no focal motor/sensory  deficits  LABORATORY DATA:  I have reviewed the data as listed  . CBC Latest Ref Rng & Units 02/19/2020 12/21/2019 10/23/2019  WBC 4.0 - 10.5 K/uL 4.9 4.9 5.0  Hemoglobin 12.0 - 15.0 g/dL 14.6 15.2(H) 15.1(H)  Hematocrit 36.0 - 46.0 % 42.4 44.5 44.6  Platelets 150 - 400 K/uL 192 199 219   . CBC    Component Value Date/Time  WBC 4.9 02/19/2020 0828   RBC 4.95 02/19/2020 0828   HGB 14.6 02/19/2020 0828   HGB 14.5 07/03/2018 0835   HGB 12.8 09/10/2017 0801   HCT 42.4 02/19/2020 0828   HCT 39.4 09/10/2017 0801   PLT 192 02/19/2020 0828   PLT 183 07/03/2018 0835   PLT 144 (L) 09/10/2017 0801   MCV 85.7 02/19/2020 0828   MCV 79.0 (L) 09/10/2017 0801   MCH 29.5 02/19/2020 0828   MCHC 34.4 02/19/2020 0828   RDW 13.0 02/19/2020 0828   RDW 15.6 (H) 09/10/2017 0801   LYMPHSABS 1.3 02/19/2020 0828   LYMPHSABS 0.9 09/10/2017 0801   MONOABS 0.5 02/19/2020 0828   MONOABS 0.4 09/10/2017 0801   EOSABS 0.1 02/19/2020 0828   EOSABS 0.1 09/10/2017 0801   BASOSABS 0.0 02/19/2020 0828   BASOSABS 0.0 09/10/2017 0801    . CMP Latest Ref Rng & Units 02/19/2020 12/21/2019 10/23/2019  Glucose 70 - 99 mg/dL 117(H) 118(H) 107(H)  BUN 8 - 23 mg/dL 17 19 18   Creatinine 0.44 - 1.00 mg/dL 0.77 0.80 0.77  Sodium 135 - 145 mmol/L 140 141 140  Potassium 3.5 - 5.1 mmol/L 4.2 4.4 3.9  Chloride 98 - 111 mmol/L 108 110 108  CO2 22 - 32 mmol/L 22 24 22   Calcium 8.9 - 10.3 mg/dL 9.3 8.9 8.9  Total Protein 6.5 - 8.1 g/dL 6.5 6.4(L) 6.7  Total Bilirubin 0.3 - 1.2 mg/dL 0.8 0.8 0.8  Alkaline Phos 38 - 126 U/L 70 70 74  AST 15 - 41 U/L 33 30 32  ALT 0 - 44 U/L 74(H) 67(H) 72(H)   . Lab Results  Component Value Date   LDH 222 (H) 02/19/2020     12/27/17 BM Bx:   12/27/17 Flow Cytometry:     RADIOGRAPHIC STUDIES: I have personally reviewed the radiological images as listed and agreed with the findings in the report. US Abdomen Complete  Result Date: 02/08/2020 CLINICAL DATA:  Follow-up  splenomegaly. Splenic marginal zone B-cell lymphoma. Elevated liver function tests. EXAM: ABDOMEN ULTRASOUND COMPLETE COMPARISON:  04/18/2018 and 07/31/2017 FINDINGS: Gallbladder: No gallstones or wall thickening visualized. Several nonmobile gallbladder polyps are again seen, largest measuring 8 mm. These show no significant change compared to prior study in 2018. No sonographic Murphy sign noted by sonographer. Common bile duct: Diameter: 4 mm, within normal limits. Liver: Diffusely increased echogenicity of the hepatic parenchyma, consistent with hepatic steatosis. No hepatic mass identified. Portal vein is patent on color Doppler imaging with normal direction of blood flow towards the liver. IVC: No abnormality visualized. Pancreas: Visualized portion unremarkable. Spleen: Measures 9.6 by 4.6 x 11.4 cm, with calculated volume of 262 mL. This is within normal limits in size, and has decreased from 432 mL on prior study in 2019. No splenic masses identified. Right Kidney: Length: 10.7 cm. Echogenicity within normal limits. No mass or hydronephrosis visualized. Left Kidney: Length: 10.0 cm. Echogenicity within normal limits. No mass or hydronephrosis visualized. Abdominal aorta: No aneurysm visualized. Other findings: None. IMPRESSION: Interval resolution of splenomegaly since previous study. No splenic masses identified. Diffuse hepatic steatosis. Stable small gallbladder polyps. No evidence of cholelithiasis or biliary ductal dilatation. Electronically Signed   By: Marlaine Hind M.D.   On: 02/08/2020 09:08    ASSESSMENT & PLAN:   64 y.o. female with  1. S/p Pancytopenia (resolved) due to diagnosed Splenic marginal zone lymphoma. Patient appeared to be have significant constitutional symptoms . No evidence of rheumatological or infectious etiology for  her fevers and night sweats. Constitutional symptoms now resolved.  2. S/p Splenomegaly - likely due to [place]. Marland Kitchenimproved on [place] abd. Clinical no palpable  splenomegaly today.   04/18/18 US Abdomen revealed Splenomegaly with a length of 13.3 cm and a volume of 432 cc. By report, the spleen measured up to 15.1 cm in maximum dimension on the PET-CT from January 08, 2018 suggesting interval improvement.   3. Anemia - with elevated LDH and low haptoglobin concerning for hemolysis. Coombs neg . Could be IgA driven Coombs neg AIHA. +ve reticulocytosis and Smear with some microspherocytes. No over urine hemosiderinuria. Partly anemia could also be from hypersplenism.  hgb normal now at 14.1  4. Low C4 ? Immune complex disease/autoimmunity related to lymphoma. Per rheumatology - no evidence of primary rheumatological disorder. No overt evidence of endocarditis.  5. h/o Fevers/chills/night sweat -- likely constitutional symptoms related to ?lymphoma - now resolved. 6. Thrombocytopenia likely from splenomegaly vs lymphoma-- resolved. PLT resolved to 187k on 09/11/18 labs  PLAN:  -Discussed pt labwork today, 02/19/20; of CBC w/diff and CMP is as follows: all values are WNL except for Glucose at 117, ALT at 74 -Discussed 02/19/20 of LDH at 222 -Discussed 02/08/20 of US Abdomen Complete ([id number]) -Elevated LDH and ALT could be a sign of Fatty Liver  -The pt shows no clinical or lab evidence of Splenic Marginal Zone Lymphoma progression at this time. -The pt as no prohibitive toxicities from continuing C10 of maintenance Rituxan at this time -Plan to complete at least two years of maintenance treatment before discontinuing -Only 2 cycles of maintenance Rituxan left before 2 years of maintenance is completed  -Advised on fatty liver- some inflammation with elevated ALT, steatohepatitis -Recommended that the pt continue to eat well, drink at least 48-64 oz of water each day, and walk 20-30 minutes each day.  -Continue Vitamin B-complex supplement  FOLLOW UP: Plz schedule next 2 doses of maintenance Rituxan as per orders (every 60 days) with labs and MD  visits.  The total time spent in the appt was 30 minutes and more than 50% was on counseling and direct patient cares, ordering and management and toxicity evaluation for Rituxan immunotherapy  All of the patient's questions were answered with apparent satisfaction. The patient knows to call the clinic with any problems, questions or concerns.   Sullivan Lone MD MS AAHIVMS [facility] Hematology/Oncology Physician [facility]  (Office):       [phone] (Work cell):  [phone] (Fax):           [phone]  02/19/2020 9:45 AM  I, Dawayne Cirri am acting as a scribe for Dr. Sullivan Lone.   .I have reviewed the above documentation for accuracy and completeness, and I agree with the above. Brunetta Genera MD

## 2020-02-19 ENCOUNTER — Inpatient Hospital Stay: Payer: 59 | Attending: Hematology and Oncology

## 2020-02-19 ENCOUNTER — Inpatient Hospital Stay (HOSPITAL_BASED_OUTPATIENT_CLINIC_OR_DEPARTMENT_OTHER): Payer: 59 | Admitting: Hematology

## 2020-02-19 ENCOUNTER — Inpatient Hospital Stay: Payer: 59

## 2020-02-19 ENCOUNTER — Other Ambulatory Visit: Payer: Self-pay

## 2020-02-19 VITALS — BP 153/83 | HR 69 | Temp 97.8°F | Resp 18

## 2020-02-19 VITALS — BP 151/80 | HR 75 | Temp 98.3°F | Resp 18 | Ht 62.5 in | Wt 148.6 lb

## 2020-02-19 DIAGNOSIS — Z5112 Encounter for antineoplastic immunotherapy: Secondary | ICD-10-CM | POA: Insufficient documentation

## 2020-02-19 DIAGNOSIS — C8307 Small cell B-cell lymphoma, spleen: Secondary | ICD-10-CM

## 2020-02-19 DIAGNOSIS — C884 Extranodal marginal zone B-cell lymphoma of mucosa-associated lymphoid tissue [MALT-lymphoma]: Secondary | ICD-10-CM | POA: Insufficient documentation

## 2020-02-19 DIAGNOSIS — Z7189 Other specified counseling: Secondary | ICD-10-CM

## 2020-02-19 LAB — CBC WITH DIFFERENTIAL/PLATELET
Abs Immature Granulocytes: 0.01 10*3/uL (ref 0.00–0.07)
Basophils Absolute: 0 10*3/uL (ref 0.0–0.1)
Basophils Relative: 1 %
Eosinophils Absolute: 0.1 10*3/uL (ref 0.0–0.5)
Eosinophils Relative: 3 %
HCT: 42.4 % (ref 36.0–46.0)
Hemoglobin: 14.6 g/dL (ref 12.0–15.0)
Immature Granulocytes: 0 %
Lymphocytes Relative: 27 %
Lymphs Abs: 1.3 10*3/uL (ref 0.7–4.0)
MCH: 29.5 pg (ref 26.0–34.0)
MCHC: 34.4 g/dL (ref 30.0–36.0)
MCV: 85.7 fL (ref 80.0–100.0)
Monocytes Absolute: 0.5 10*3/uL (ref 0.1–1.0)
Monocytes Relative: 11 %
Neutro Abs: 2.9 10*3/uL (ref 1.7–7.7)
Neutrophils Relative %: 58 %
Platelets: 192 10*3/uL (ref 150–400)
RBC: 4.95 MIL/uL (ref 3.87–5.11)
RDW: 13 % (ref 11.5–15.5)
WBC: 4.9 10*3/uL (ref 4.0–10.5)
nRBC: 0 % (ref 0.0–0.2)

## 2020-02-19 LAB — CMP (CANCER CENTER ONLY)
ALT: 74 U/L — ABNORMAL HIGH (ref 0–44)
AST: 33 U/L (ref 15–41)
Albumin: 4.3 g/dL (ref 3.5–5.0)
Alkaline Phosphatase: 70 U/L (ref 38–126)
Anion gap: 10 (ref 5–15)
BUN: 17 mg/dL (ref 8–23)
CO2: 22 mmol/L (ref 22–32)
Calcium: 9.3 mg/dL (ref 8.9–10.3)
Chloride: 108 mmol/L (ref 98–111)
Creatinine: 0.77 mg/dL (ref 0.44–1.00)
GFR, Est AFR Am: >60 mL/min (ref >60)
GFR, Estimated: >60 mL/min (ref >60)
Glucose, Bld: 117 mg/dL — ABNORMAL HIGH (ref 70–99)
Potassium: 4.2 mmol/L (ref 3.5–5.1)
Sodium: 140 mmol/L (ref 135–145)
Total Bilirubin: 0.8 mg/dL (ref 0.3–1.2)
Total Protein: 6.5 g/dL (ref 6.5–8.1)

## 2020-02-19 LAB — LACTATE DEHYDROGENASE: LDH: 222 U/L — ABNORMAL HIGH (ref 98–192)

## 2020-02-19 MED ORDER — ACETAMINOPHEN 325 MG PO TABS
650.0000 mg | ORAL_TABLET | Freq: Once | ORAL | Status: AC
  Administered 2020-02-19: 650 mg via ORAL

## 2020-02-19 MED ORDER — FAMOTIDINE 20 MG PO TABS
20.0000 mg | ORAL_TABLET | Freq: Once | ORAL | Status: AC
  Administered 2020-02-19: 20 mg via ORAL

## 2020-02-19 MED ORDER — DIPHENHYDRAMINE HCL 25 MG PO CAPS
ORAL_CAPSULE | ORAL | Status: AC
  Filled 2020-02-19: qty 2

## 2020-02-19 MED ORDER — ACETAMINOPHEN 325 MG PO TABS
ORAL_TABLET | ORAL | Status: AC
  Filled 2020-02-19: qty 2

## 2020-02-19 MED ORDER — FAMOTIDINE 20 MG PO TABS
ORAL_TABLET | ORAL | Status: AC
  Filled 2020-02-19: qty 1

## 2020-02-19 MED ORDER — SODIUM CHLORIDE 0.9 % IV SOLN
375.0000 mg/m2 | Freq: Once | INTRAVENOUS | Status: AC
  Administered 2020-02-19: 600 mg via INTRAVENOUS
  Filled 2020-02-19: qty 10

## 2020-02-19 MED ORDER — METHYLPREDNISOLONE SODIUM SUCC 125 MG IJ SOLR
INTRAMUSCULAR | Status: AC
  Filled 2020-02-19: qty 2

## 2020-02-19 MED ORDER — SODIUM CHLORIDE 0.9 % IV SOLN
Freq: Once | INTRAVENOUS | Status: AC
  Filled 2020-02-19: qty 250

## 2020-02-19 MED ORDER — METHYLPREDNISOLONE SODIUM SUCC 125 MG IJ SOLR
125.0000 mg | Freq: Every day | INTRAMUSCULAR | Status: DC
  Administered 2020-02-19: 125 mg via INTRAVENOUS

## 2020-02-19 MED ORDER — DIPHENHYDRAMINE HCL 25 MG PO CAPS
50.0000 mg | ORAL_CAPSULE | Freq: Once | ORAL | Status: AC
  Administered 2020-02-19: 50 mg via ORAL

## 2020-02-19 NOTE — Patient Instructions (Signed)
[facility] °Discharge Instructions for Patients Receiving Chemotherapy ° °Today you received the following chemotherapy agents: Rituximab ° °To help prevent nausea and vomiting after your treatment, we encourage you to take your nausea medication as directed.  °  °If you develop nausea and vomiting that is not controlled by your nausea medication, call the clinic.  ° °BELOW ARE SYMPTOMS THAT SHOULD BE REPORTED IMMEDIATELY: °· *FEVER GREATER THAN 100.5 F °· *CHILLS WITH OR WITHOUT FEVER °· NAUSEA AND VOMITING THAT IS NOT CONTROLLED WITH YOUR NAUSEA MEDICATION °· *UNUSUAL SHORTNESS OF BREATH °· *UNUSUAL BRUISING OR BLEEDING °· TENDERNESS IN MOUTH AND THROAT WITH OR WITHOUT PRESENCE OF ULCERS °· *URINARY PROBLEMS °· *BOWEL PROBLEMS °· UNUSUAL RASH °Items with * indicate a potential emergency and should be followed up as soon as possible. ° °Feel free to call the clinic should you have any questions or concerns. The clinic phone number is [phone]. ° °Please show the CHEMO ALERT CARD at check-in to the Emergency Department and triage nurse. ° ° °

## 2020-02-22 ENCOUNTER — Telehealth: Payer: Self-pay | Admitting: Hematology

## 2020-02-22 NOTE — Telephone Encounter (Signed)
Scheduled per 05/14 los, patient has been called and notified. 

## 2020-04-19 ENCOUNTER — Inpatient Hospital Stay (HOSPITAL_BASED_OUTPATIENT_CLINIC_OR_DEPARTMENT_OTHER): Payer: 59 | Admitting: Hematology

## 2020-04-19 ENCOUNTER — Inpatient Hospital Stay: Payer: 59

## 2020-04-19 ENCOUNTER — Inpatient Hospital Stay: Payer: 59 | Attending: Hematology and Oncology

## 2020-04-19 ENCOUNTER — Other Ambulatory Visit: Payer: Self-pay

## 2020-04-19 VITALS — BP 153/83 | HR 73 | Temp 97.7°F | Resp 17 | Ht 62.5 in | Wt 145.4 lb

## 2020-04-19 VITALS — BP 138/69 | HR 82 | Temp 98.1°F | Resp 18

## 2020-04-19 DIAGNOSIS — Z5112 Encounter for antineoplastic immunotherapy: Secondary | ICD-10-CM | POA: Diagnosis not present

## 2020-04-19 DIAGNOSIS — C884 Extranodal marginal zone B-cell lymphoma of mucosa-associated lymphoid tissue [MALT-lymphoma]: Secondary | ICD-10-CM | POA: Diagnosis not present

## 2020-04-19 DIAGNOSIS — D649 Anemia, unspecified: Secondary | ICD-10-CM | POA: Insufficient documentation

## 2020-04-19 DIAGNOSIS — C8307 Small cell B-cell lymphoma, spleen: Secondary | ICD-10-CM

## 2020-04-19 DIAGNOSIS — Z7189 Other specified counseling: Secondary | ICD-10-CM

## 2020-04-19 LAB — CMP (CANCER CENTER ONLY)
ALT: 50 U/L — ABNORMAL HIGH (ref 0–44)
AST: 26 U/L (ref 15–41)
Albumin: 4.4 g/dL (ref 3.5–5.0)
Alkaline Phosphatase: 72 U/L (ref 38–126)
Anion gap: 11 (ref 5–15)
BUN: 16 mg/dL (ref 8–23)
CO2: 24 mmol/L (ref 22–32)
Calcium: 9.6 mg/dL (ref 8.9–10.3)
Chloride: 106 mmol/L (ref 98–111)
Creatinine: 0.78 mg/dL (ref 0.44–1.00)
GFR, Est AFR Am: >60 mL/min (ref >60)
GFR, Estimated: >60 mL/min (ref >60)
Glucose, Bld: 102 mg/dL — ABNORMAL HIGH (ref 70–99)
Potassium: 4 mmol/L (ref 3.5–5.1)
Sodium: 141 mmol/L (ref 135–145)
Total Bilirubin: 1 mg/dL (ref 0.3–1.2)
Total Protein: 6.9 g/dL (ref 6.5–8.1)

## 2020-04-19 LAB — CBC WITH DIFFERENTIAL/PLATELET
Abs Immature Granulocytes: 0.01 10*3/uL (ref 0.00–0.07)
Basophils Absolute: 0 10*3/uL (ref 0.0–0.1)
Basophils Relative: 1 %
Eosinophils Absolute: 0.1 10*3/uL (ref 0.0–0.5)
Eosinophils Relative: 3 %
HCT: 43.1 % (ref 36.0–46.0)
Hemoglobin: 14.8 g/dL (ref 12.0–15.0)
Immature Granulocytes: 0 %
Lymphocytes Relative: 19 %
Lymphs Abs: 1 10*3/uL (ref 0.7–4.0)
MCH: 30.6 pg (ref 26.0–34.0)
MCHC: 34.3 g/dL (ref 30.0–36.0)
MCV: 89 fL (ref 80.0–100.0)
Monocytes Absolute: 0.5 10*3/uL (ref 0.1–1.0)
Monocytes Relative: 10 %
Neutro Abs: 3.8 10*3/uL (ref 1.7–7.7)
Neutrophils Relative %: 67 %
Platelets: 204 10*3/uL (ref 150–400)
RBC: 4.84 MIL/uL (ref 3.87–5.11)
RDW: 12.7 % (ref 11.5–15.5)
WBC: 5.5 10*3/uL (ref 4.0–10.5)
nRBC: 0 % (ref 0.0–0.2)

## 2020-04-19 LAB — LACTATE DEHYDROGENASE: LDH: 260 U/L — ABNORMAL HIGH (ref 98–192)

## 2020-04-19 MED ORDER — FAMOTIDINE 20 MG PO TABS
20.0000 mg | ORAL_TABLET | Freq: Once | ORAL | Status: AC
  Administered 2020-04-19: 20 mg via ORAL

## 2020-04-19 MED ORDER — ACETAMINOPHEN 325 MG PO TABS
650.0000 mg | ORAL_TABLET | Freq: Once | ORAL | Status: AC
  Administered 2020-04-19: 650 mg via ORAL

## 2020-04-19 MED ORDER — DIPHENHYDRAMINE HCL 25 MG PO CAPS
ORAL_CAPSULE | ORAL | Status: AC
  Filled 2020-04-19: qty 1

## 2020-04-19 MED ORDER — FAMOTIDINE 20 MG PO TABS
ORAL_TABLET | ORAL | Status: AC
  Filled 2020-04-19: qty 1

## 2020-04-19 MED ORDER — ACETAMINOPHEN 325 MG PO TABS
ORAL_TABLET | ORAL | Status: AC
  Filled 2020-04-19: qty 2

## 2020-04-19 MED ORDER — METHYLPREDNISOLONE SODIUM SUCC 125 MG IJ SOLR
125.0000 mg | Freq: Every day | INTRAMUSCULAR | Status: DC
  Administered 2020-04-19: 125 mg via INTRAVENOUS

## 2020-04-19 MED ORDER — SODIUM CHLORIDE 0.9 % IV SOLN
375.0000 mg/m2 | Freq: Once | INTRAVENOUS | Status: AC
  Administered 2020-04-19: 600 mg via INTRAVENOUS
  Filled 2020-04-19: qty 10

## 2020-04-19 MED ORDER — METHYLPREDNISOLONE SODIUM SUCC 125 MG IJ SOLR
INTRAMUSCULAR | Status: AC
  Filled 2020-04-19: qty 2

## 2020-04-19 MED ORDER — DIPHENHYDRAMINE HCL 25 MG PO CAPS
50.0000 mg | ORAL_CAPSULE | Freq: Once | ORAL | Status: AC
  Administered 2020-04-19: 50 mg via ORAL

## 2020-04-19 MED ORDER — SODIUM CHLORIDE 0.9 % IV SOLN
Freq: Once | INTRAVENOUS | Status: AC
  Filled 2020-04-19: qty 250

## 2020-04-19 NOTE — Progress Notes (Signed)
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 She is accompanied today by three members of her family. The pt reports that she is doing well overall.   The pt notes that on March 6 she developed a low-grade fever and continues to have a fever. She has kept her fever at [place] with Tylenol every 6 hours. She notes that today she has felt the least feverish since March 6.  She saw her PCP Dr. Hulan Fess who ruled out influenza and UTI. She has also seen rheumatology over the last couple weeks and had a rheumatological workup that did not show overt evidence of a primary autoimmune condition. She was noted to be neg for ANA, RF and CCP Ab. Interesting was noted to have low C4 levels.  She notes that her resting HR has been elevated and describes her heart rate as tachycardic. She had an ECHO on 12/26/17 which showed a normal EF, and was then referred to see infectious disease: CMV and TB Quantiferon were negative.  She notes that the ID team told her there was no clear evidence of an infectious process. TTE no vegetation. BL Bcx neg.  She notes that last week she developed a cough, but believes it to be allergy related. She notes that she has had an enlarged spleen since at least October 2018 revealed by a 07/31/17 US Abdomen. She notes some abdominal discomfort, and has felt that she gets full quickly after eating.  In the last year she intentionally dropped about 20 lbs of weight with change in her diet and avoiding most carbs. She notes that in the last 3 weeks, since she began feeling ill this month, she  has lost about 6-7 lbs.   She also notes that she has been having night sweats that have returned this month after a few months of night sweats last year which abated in August 2018. She notes no other signs of recent infection, and denies any travel outside of the country. She denies any concern of insect bites.   She notes that in [place] she was on iron pills for low Hgb, which increased her Hgb. She stopped taking after her Hgb normalized.  She notes that in March 2018 she had a bad stomach bug and in December 2017 she had influenza.  She notes that she has been under a lot of stress with the deaths of her mother and step-father in a couple weeks of one another.   The pt reports that she has seen rheumatology twice for her hand, knee and hip pain but was confirmed to have osteoarthritis as opposed to rheumatoid arthritis. She notes that the severity of her joint pains mirror the content of her diet. She notes that her joint have never been red or swollen.   She notes that she will be having an iron infusion on 01/03/18.  Of note prior to the patient's visit today, pt has had BM Bx completed on 12/27/17 with results revealing HYPERCELLULAR BONE MARROW FOR AGE WITH NUMEROUS ATYPICAL LYMPHOID AGGREGATES. - TRILINEAGE HEMATOPOIESIS. On 12/27/17 the patient's flow cytometry revealed  A MINOR MONOCLONAL B-CELL POPULATION IDENTIFIED.  Flow cytometric analysis shows a minor population of monoclonal B cells (17% of all lymphocytes) expressing pan B-cell antigens including CD20 with lack of CD5, CD10, CD25 or CD103 expression. Immunohistochemical stains were also performed and show that the lymphoid aggregates show a mixture of T and B cells with slight predominance of T cells. No significant cyclin D1, CD34, or TdT positivity is identified. The overall findings are limited but slightly favor involvement by a B-cell lymphoproliferative process. Consideration was given to marginal zone lymphoma and  splenic lymphoma especially in the presence of splenomegaly. Nonetheless, additional material (ie lymph node or tissue biopsy) is strongly recommended for further evaluation.  I discussed the BM Bx results with Dr Gari Crown -- overall BM Bx + clinical picture certainly concerning for SMZL with possible coombs neg AIHA and possible autoimmune phenomenon related to [place]?Marland Kitchen  Most recent lab results (12/30/17) of CBC  is as follows: all values are WNL except for WBC at 3.5k, RBC at 3.18, Hgb at 7.6, HCT at 24.4, MCV at 76.7, MCH at 23.9, MCHC at 31.1, RDW at 17.0. LDH 12/30/17 was at 616. Haptoglobin<10 -- suggestive of hemolysis. C-reactive protein 09/10/17 was elevated at 20.5.   On review of systems, pt reports low-grade fever, night sweats, some fatigue, tachycardia, occasional lightheadedness, and denies dizziness, weakness, noticing any enlarged lymph nodes, changes in bowel habits, abdominal pains, abnormal vaginal discharge, and any other symptoms.   On PMHx the pt reports high blood pressure and degenerative arthritis. On Family Hx the pt reports her father had rheumatoid arthritis, her mother had kidney disease.  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 The patient's last visit with [place] was on 02/19/2020. The pt reports that she is doing well overall.  The pt reports she ran into a nest of yellow jackets a few days ago and was stung nearly 10 times. Pt took Benadryl, but has an itchy rash today. She has noticed more joint pain, which she attributes to osteoarthritis. She has no new constitutional symptoms.   Lab results today (04/19/20) of CBC w/diff and CMP is as follows: all values are WNL except for Glucose at 102, ALT at 50. 04/19/2020 LDH at 260  On review of systems, pt reports itchy rash, joint pain and denies joint swelling, fevers, chills, night sweats, abdominal pain and any other symptoms.     MEDICAL HISTORY:  Past Medical History:  Diagnosis Date  . Arthritis   . Hypertension     SURGICAL HISTORY: No past surgical history on file.  SOCIAL HISTORY: Social History   Socioeconomic History  . Marital status: Single    Spouse name: Not on file  . Number of children: Not on file  . Years of education: Not on file  . Highest education level: Not on file  Occupational History  . Not on file  Tobacco Use  . Smoking status: Never Smoker  . Smokeless tobacco: Never Used  Vaping Use  . Vaping Use: Never used  Substance and Sexual Activity  . Alcohol use: Not Currently    Alcohol/week: 0.0 standard drinks  . Drug use: Never  . Sexual activity: Not on file  Other Topics Concern  . Not on file  Social History Narrative  . Not on file   Social Determinants of Health   Financial Resource Strain:   . Difficulty of Paying Living Expenses:  Food Insecurity:   . Worried About Charity fundraiser in the Last Year:   . Arboriculturist in the Last Year:   Transportation Needs:   . Film/video editor (Medical):   Marland Kitchen Lack of Transportation (Non-Medical):   Physical Activity:   . Days of Exercise per Week:   . Minutes of Exercise per Session:   Stress:   . Feeling of Stress :   Social Connections:   . Frequency of Communication with Friends and Family:   . Frequency of Social Gatherings with Friends and Family:   . Attends Religious Services:   . Active Member of Clubs or Organizations:   . Attends Archivist Meetings:   Marland Kitchen Marital Status:   Intimate Partner Violence:   . Fear of Current or Ex-Partner:   . Emotionally Abused:   Marland Kitchen Physically Abused:   . Sexually Abused:     FAMILY HISTORY: Family History  Problem Relation Age of Onset  . Diabetes Mother   . Hyperlipidemia Mother   . Hypertension Mother     ALLERGIES:  is allergic to biaxin [clarithromycin].  MEDICATIONS:  Current Outpatient Medications  Medication Sig Dispense Refill   . folic acid (FOLVITE) 300 MCG tablet Take 800 mcg by mouth daily.     Marland Kitchen losartan (COZAAR) 50 MG tablet Take 50 mg by mouth daily.    . valACYclovir (VALTREX) 1000 MG tablet Take 1 tablet (1,000 mg total) by mouth 2 (two) times daily. 20 tablet 0  . vitamin B-12 (CYANOCOBALAMIN) 1000 MCG tablet Take 1,000 mcg by mouth daily.     No current facility-administered medications for this visit.    REVIEW OF SYSTEMS:   A 10+ POINT REVIEW OF SYSTEMS WAS OBTAINED including neurology, dermatology, psychiatry, cardiac, respiratory, lymph, extremities, GI, GU, Musculoskeletal, constitutional, breasts, reproductive, HEENT.  All pertinent positives are noted in the HPI.  All others are negative.   PHYSICAL EXAMINATION: ECOG FS:1 - Symptomatic but completely ambulatory  There were no vitals filed for this visit. Wt Readings from Last 3 Encounters:  02/19/20 148 lb 9.6 oz (67.4 kg)  12/21/19 149 lb 4.8 oz (67.7 kg)  10/23/19 149 lb 1.6 oz (67.6 kg)   There is no height or weight on file to calculate BMI.    GENERAL:alert, in no acute distress and comfortable SKIN: no acute rashes, no significant lesions EYES: conjunctiva are pink and non-injected, sclera anicteric OROPHARYNX: MMM, no exudates, no oropharyngeal erythema or ulceration NECK: supple, no JVD LYMPH:  no palpable lymphadenopathy in the cervical, axillary or inguinal regions LUNGS: clear to auscultation b/l with normal respiratory effort HEART: regular rate & rhythm ABDOMEN:  normoactive bowel sounds , non tender, not distended. No palpable hepatosplenomegaly.  Extremity: no pedal edema PSYCH: alert & oriented x 3 with fluent speech NEURO: no focal motor/sensory deficits  LABORATORY DATA:  I have reviewed the data as listed  . CBC Latest Ref Rng & Units 02/19/2020 12/21/2019 10/23/2019  WBC 4.0 - 10.5 K/uL 4.9 4.9 5.0  Hemoglobin 12.0 - 15.0 g/dL 14.6 15.2(H) 15.1(H)  Hematocrit 36 - 46 % 42.4 44.5 44.6  Platelets 150 - 400 K/uL  192 199 219   . CBC    Component Value Date/Time   WBC 4.9 02/19/2020 0828   RBC 4.95 02/19/2020 0828   HGB 14.6 02/19/2020 0828   HGB 14.5 07/03/2018 0835   HGB 12.8 09/10/2017 0801   HCT 42.4 02/19/2020 0828   HCT 39.4 09/10/2017 0801  PLT 192 02/19/2020 0828   PLT 183 07/03/2018 0835   PLT 144 (L) 09/10/2017 0801   MCV 85.7 02/19/2020 0828   MCV 79.0 (L) 09/10/2017 0801   MCH 29.5 02/19/2020 0828   MCHC 34.4 02/19/2020 0828   RDW 13.0 02/19/2020 0828   RDW 15.6 (H) 09/10/2017 0801   LYMPHSABS 1.3 02/19/2020 0828   LYMPHSABS 0.9 09/10/2017 0801   MONOABS 0.5 02/19/2020 0828   MONOABS 0.4 09/10/2017 0801   EOSABS 0.1 02/19/2020 0828   EOSABS 0.1 09/10/2017 0801   BASOSABS 0.0 02/19/2020 0828   BASOSABS 0.0 09/10/2017 0801    . CMP Latest Ref Rng & Units 02/19/2020 12/21/2019 10/23/2019  Glucose 70 - 99 mg/dL 117(H) 118(H) 107(H)  BUN 8 - 23 mg/dL 17 19 18   Creatinine 0.44 - 1.00 mg/dL 0.77 0.80 0.77  Sodium 135 - 145 mmol/L 140 141 140  Potassium 3.5 - 5.1 mmol/L 4.2 4.4 3.9  Chloride 98 - 111 mmol/L 108 110 108  CO2 22 - 32 mmol/L 22 24 22   Calcium 8.9 - 10.3 mg/dL 9.3 8.9 8.9  Total Protein 6.5 - 8.1 g/dL 6.5 6.4(L) 6.7  Total Bilirubin 0.3 - 1.2 mg/dL 0.8 0.8 0.8  Alkaline Phos 38 - 126 U/L 70 70 74  AST 15 - 41 U/L 33 30 32  ALT 0 - 44 U/L 74(H) 67(H) 72(H)   . Lab Results  Component Value Date   LDH 222 (H) 02/19/2020     12/27/17 BM Bx:   12/27/17 Flow Cytometry:     RADIOGRAPHIC STUDIES: I have personally reviewed the radiological images as listed and agreed with the findings in the report. No results found.  ASSESSMENT & PLAN:   64 y.o. female with  1. S/p Pancytopenia (resolved) due to diagnosed Splenic marginal zone lymphoma. Patient appeared to be have significant constitutional symptoms . No evidence of rheumatological or infectious etiology for her fevers and night sweats. Constitutional symptoms now resolved.  2. S/p Splenomegaly -  likely due to [place]. Marland Kitchenimproved on [place] abd. Clinical no palpable splenomegaly today.   04/18/18 US Abdomen revealed Splenomegaly with a length of 13.3 cm and a volume of 432 cc. By report, the spleen measured up to 15.1 cm in maximum dimension on the PET-CT from January 08, 2018 suggesting interval improvement.   3. Anemia - with elevated LDH and low haptoglobin concerning for hemolysis. Coombs neg . Could be IgA driven Coombs neg AIHA. +ve reticulocytosis and Smear with some microspherocytes. No over urine hemosiderinuria. Partly anemia could also be from hypersplenism.  hgb normal now at 14.1  4. Low C4 ? Immune complex disease/autoimmunity related to lymphoma. Per rheumatology - no evidence of primary rheumatological disorder. No overt evidence of endocarditis.  5. h/o Fevers/chills/night sweat -- likely constitutional symptoms related to ?lymphoma - now resolved. 6. Thrombocytopenia likely from splenomegaly vs lymphoma-- resolved. PLT resolved to 187k on 09/11/18 labs  PLAN:  -Discussed pt labwork today, 04/19/20; blood counts are normal, ALT is chronically elevated, LDH is elevated - likely from liver, does not correlate well with lymphoma  -The pt shows no clinical or lab evidence of Splenic Marginal Zone Lymphoma progression at this time. -The pt as no prohibitive toxicities from continuing C11 of maintenance Rituxan at this time -Will discontinue maintenance Rituxan after C12 -Not unreasonable for pt to take Claritin + Pepcid daily for 1 week to prevent futher allergic reaction.  -Continue B-complex vitamin  -Will see back in 2 months with labs  FOLLOW UP: Plz schedule next dose of maintenance Rituxan as per orders in 60 days with labs and MD visits.   The total time spent in the appt was 20 minutes and more than 50% was on counseling and direct patient cares.  All of the patient's questions were answered with apparent satisfaction. The patient knows to call the clinic with any  problems, questions or concerns.    Sullivan Lone MD [place] AAHIVMS [facility] Hematology/Oncology [facility]  (Office):       [phone] (Work cell):  [phone] (Fax):           [phone]  04/19/2020 7:22 AM  I, Yevette Edwards, am acting as a scribe for Dr. Sullivan Lone.   .I have reviewed the above documentation for accuracy and completeness, and I agree with the above. Brunetta Genera MD

## 2020-05-03 MED FILL — LOSARTAN POTASSIUM 50 MG TA: 50 | 90 days supply | Qty: 90 | Fill #1

## 2020-05-24 ENCOUNTER — Encounter: Payer: Self-pay | Admitting: *Deleted

## 2020-05-24 ENCOUNTER — Encounter: Payer: Self-pay | Admitting: Hematology

## 2020-05-24 DIAGNOSIS — R0981 Nasal congestion: Secondary | ICD-10-CM | POA: Diagnosis not present

## 2020-05-25 ENCOUNTER — Other Ambulatory Visit: Payer: Self-pay

## 2020-06-01 DIAGNOSIS — Z23 Encounter for immunization: Secondary | ICD-10-CM | POA: Diagnosis not present

## 2020-06-15 NOTE — Progress Notes (Signed)
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 She is accompanied today by three members of her family. The pt reports that she is doing well overall.   The pt notes that on March 6 she developed a low-grade fever and continues to have a fever. She has kept her fever at [place] with Tylenol every 6 hours. She notes that today she has felt the least feverish since March 6.  She saw her PCP Dr. Hulan Fess who ruled out influenza and UTI. She has also seen rheumatology over the last couple weeks and had a rheumatological workup that did not show overt evidence of a primary autoimmune condition. She was noted to be neg for ANA, RF and CCP Ab. Interesting was noted to have low C4 levels.  She notes that her resting HR has been elevated and describes her heart rate as tachycardic. She had an ECHO on 12/26/17 which showed a normal EF, and was then referred to see infectious disease: CMV and TB Quantiferon were negative.  She notes that the ID team told her there was no clear evidence of an infectious process. TTE no vegetation. BL Bcx neg.  She notes that last week she developed a cough, but believes it to be allergy related. She notes that she has had an enlarged spleen since at least October 2018 revealed by a 07/31/17 US Abdomen. She notes some abdominal discomfort, and has felt that she gets full quickly after eating.  In the last year she intentionally dropped about 20 lbs of weight with change in her diet and avoiding most carbs. She notes that in the last 3 weeks, since she began feeling ill this month, she  has lost about 6-7 lbs.   She also notes that she has been having night sweats that have returned this month after a few months of night sweats last year which abated in August 2018. She notes no other signs of recent infection, and denies any travel outside of the country. She denies any concern of insect bites.   She notes that in [place] she was on iron pills for low Hgb, which increased her Hgb. She stopped taking after her Hgb normalized.  She notes that in March 2018 she had a bad stomach bug and in December 2017 she had influenza.  She notes that she has been under a lot of stress with the deaths of her mother and step-father in a couple weeks of one another.   The pt reports that she has seen rheumatology twice for her hand, knee and hip pain but was confirmed to have osteoarthritis as opposed to rheumatoid arthritis. She notes that the severity of her joint pains mirror the content of her diet. She notes that her joint have never been red or swollen.   She notes that she will be having an iron infusion on 01/03/18.  Of note prior to the patient's visit today, pt has had BM Bx completed on 12/27/17 with results revealing HYPERCELLULAR BONE MARROW FOR AGE WITH NUMEROUS ATYPICAL LYMPHOID AGGREGATES. - TRILINEAGE HEMATOPOIESIS. On 12/27/17 the patient's flow cytometry revealed  A MINOR MONOCLONAL B-CELL POPULATION IDENTIFIED.  Flow cytometric analysis shows a minor population of monoclonal B cells (17% of all lymphocytes) expressing pan B-cell antigens including CD20 with lack of CD5, CD10, CD25 or CD103 expression. Immunohistochemical stains were also performed and show that the lymphoid aggregates show a mixture of T and B cells with slight predominance of T cells. No significant cyclin D1, CD34, or TdT positivity is identified. The overall findings are limited but slightly favor involvement by a B-cell lymphoproliferative process. Consideration was given to marginal zone lymphoma and  splenic lymphoma especially in the presence of splenomegaly. Nonetheless, additional material (ie lymph node or tissue biopsy) is strongly recommended for further evaluation.  I discussed the BM Bx results with Dr Gari Crown -- overall BM Bx + clinical picture certainly concerning for SMZL with possible coombs neg AIHA and possible autoimmune phenomenon related to [place]?Marland Kitchen  Most recent lab results (12/30/17) of CBC  is as follows: all values are WNL except for WBC at 3.5k, RBC at 3.18, Hgb at 7.6, HCT at 24.4, MCV at 76.7, MCH at 23.9, MCHC at 31.1, RDW at 17.0. LDH 12/30/17 was at 616. Haptoglobin<10 -- suggestive of hemolysis. C-reactive protein 09/10/17 was elevated at 20.5.   On review of systems, pt reports low-grade fever, night sweats, some fatigue, tachycardia, occasional lightheadedness, and denies dizziness, weakness, noticing any enlarged lymph nodes, changes in bowel habits, abdominal pains, abnormal vaginal discharge, and any other symptoms.   On PMHx the pt reports high blood pressure and degenerative arthritis. On Family Hx the pt reports her father had rheumatoid arthritis, her mother had kidney disease.  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 The patient's last visit with [place] was on 04/19/2020. The pt reports that she is doing well overall.  The pt reports that she has felt well outside of normal aches and pains, which she attributes to age. Pt received her COVID19 booster a few weeks ago and tolerated it well.    Lab results today (06/16/20) of CBC w/diff and CMP is as follows: all values are WNL except for Glucose at 119. 06/16/2020 LDH at 247  On review of systems, pt reports neck stiffness and denies fevers, chills, night sweats, joint swelling and any other symptoms.    MEDICAL HISTORY:  Past Medical History:  Diagnosis Date   Arthritis    Hypertension     SURGICAL HISTORY: No  past surgical history on file.  SOCIAL HISTORY: Social History   Socioeconomic History   Marital status: Single    Spouse name: Not on file   Number of children: Not on file   Years of education: Not on file   Highest education level: Not on file  Occupational History   Not on file  Tobacco Use   Smoking status: Never Smoker   Smokeless tobacco: Never Used  Vaping Use   Vaping Use: Never used  Substance and Sexual Activity   Alcohol use: Not Currently    Alcohol/week: 0.0 standard drinks   Drug use: Never   Sexual activity: Not on file  Other Topics Concern   Not on file  Social History Narrative   Not on file   Social Determinants of Health   Financial Resource Strain:    Difficulty of Paying Living Expenses: Not on file  Food Insecurity:    Worried About [place] in the Last Year: Not on file  Ran Out of Food in the Last Year: Not on file  Transportation Needs:    Lack of Transportation (Medical): Not on file   Lack of Transportation (Non-Medical): Not on file  Physical Activity:    Days of Exercise per Week: Not on file   Minutes of Exercise per Session: Not on file  Stress:    Feeling of Stress : Not on file  Social Connections:    Frequency of Communication with Friends and Family: Not on file   Frequency of Social Gatherings with Friends and Family: Not on file   Attends Religious Services: Not on file   Active Member of Clubs or Organizations: Not on file   Attends Archivist Meetings: Not on file   Marital Status: Not on file  Intimate Partner Violence:    Fear of Current or Ex-Partner: Not on file   Emotionally Abused: Not on file   Physically Abused: Not on file   Sexually Abused: Not on file    FAMILY HISTORY: Family History  Problem Relation Age of Onset   Diabetes Mother    Hyperlipidemia Mother    Hypertension Mother     ALLERGIES:  is allergic to biaxin  [clarithromycin].  MEDICATIONS:  Current Outpatient Medications  Medication Sig Dispense Refill   folic acid (FOLVITE) 604 MCG tablet Take 800 mcg by mouth daily.      losartan (COZAAR) 50 MG tablet Take 50 mg by mouth daily.     valACYclovir (VALTREX) 1000 MG tablet Take 1 tablet (1,000 mg total) by mouth 2 (two) times daily. 20 tablet 0   vitamin B-12 (CYANOCOBALAMIN) 1000 MCG tablet Take 1,000 mcg by mouth daily.     No current facility-administered medications for this visit.   Facility-Administered Medications Ordered in Other Visits  Medication Dose Route Frequency Provider Last Rate Last Admin   0.9 %  sodium chloride infusion   Intravenous Once Brunetta Genera, MD       acetaminophen (TYLENOL) tablet 650 mg  650 mg Oral Once Brunetta Genera, MD       diphenhydrAMINE (BENADRYL) capsule 50 mg  50 mg Oral Once Brunetta Genera, MD       famotidine (PEPCID) tablet 20 mg  20 mg Oral Once Brunetta Genera, MD       methylPREDNISolone sodium succinate (SOLU-MEDROL) 125 mg/2 mL injection 125 mg  125 mg Intravenous Daily Brunetta Genera, MD       riTUXimab (RITUXAN) 600 mg in sodium chloride 0.9 % 190 mL infusion  375 mg/m2 (Treatment Plan Recorded) Intravenous Once Brunetta Genera, MD        REVIEW OF SYSTEMS:   A 10+ POINT REVIEW OF SYSTEMS WAS OBTAINED including neurology, dermatology, psychiatry, cardiac, respiratory, lymph, extremities, GI, GU, Musculoskeletal, constitutional, breasts, reproductive, HEENT.  All pertinent positives are noted in the HPI.  All others are negative.   PHYSICAL EXAMINATION: ECOG FS:1 - Symptomatic but completely ambulatory  Vitals:   06/16/20 1012  BP: (!) 151/81  Pulse: 73  Resp: 18  Temp: (!) 96.9 F (36.1 C)  SpO2: 98%   Wt Readings from Last 3 Encounters:  06/16/20 147 lb 11.2 oz (67 kg)  04/19/20 145 lb 6.4 oz (66 kg)  02/19/20 148 lb 9.6 oz (67.4 kg)   Body mass index is 26.58 kg/m.     GENERAL:alert, in no acute distress and comfortable SKIN: no acute rashes, no significant lesions EYES: conjunctiva are pink and non-injected, sclera anicteric  OROPHARYNX: MMM, no exudates, no oropharyngeal erythema or ulceration NECK: supple, no JVD LYMPH:  no palpable lymphadenopathy in the cervical, axillary or inguinal regions LUNGS: clear to auscultation b/l with normal respiratory effort HEART: regular rate & rhythm ABDOMEN:  normoactive bowel sounds , non tender, not distended. No palpable hepatosplenomegaly.  Extremity: no pedal edema PSYCH: alert & oriented x 3 with fluent speech NEURO: no focal motor/sensory deficits  LABORATORY DATA:  I have reviewed the data as listed  . CBC Latest Ref Rng & Units 06/16/2020 04/19/2020 02/19/2020  WBC 4.0 - 10.5 K/uL 5.8 5.5 4.9  Hemoglobin 12.0 - 15.0 g/dL 14.5 14.8 14.6  Hematocrit 36 - 46 % 42.4 43.1 42.4  Platelets 150 - 400 K/uL 224 204 192   . CBC    Component Value Date/Time   WBC 5.8 06/16/2020 0857   RBC 4.86 06/16/2020 0857   HGB 14.5 06/16/2020 0857   HGB 14.5 07/03/2018 0835   HGB 12.8 09/10/2017 0801   HCT 42.4 06/16/2020 0857   HCT 39.4 09/10/2017 0801   PLT 224 06/16/2020 0857   PLT 183 07/03/2018 0835   PLT 144 (L) 09/10/2017 0801   MCV 87.2 06/16/2020 0857   MCV 79.0 (L) 09/10/2017 0801   MCH 29.8 06/16/2020 0857   MCHC 34.2 06/16/2020 0857   RDW 13.0 06/16/2020 0857   RDW 15.6 (H) 09/10/2017 0801   LYMPHSABS 1.2 06/16/2020 0857   LYMPHSABS 0.9 09/10/2017 0801   MONOABS 0.7 06/16/2020 0857   MONOABS 0.4 09/10/2017 0801   EOSABS 0.2 06/16/2020 0857   EOSABS 0.1 09/10/2017 0801   BASOSABS 0.1 06/16/2020 0857   BASOSABS 0.0 09/10/2017 0801    . CMP Latest Ref Rng & Units 06/16/2020 04/19/2020 02/19/2020  Glucose 70 - 99 mg/dL 119(H) 102(H) 117(H)  BUN 8 - 23 mg/dL 14 16 17   Creatinine 0.44 - 1.00 mg/dL 0.75 0.78 0.77  Sodium 135 - 145 mmol/L 143 141 140  Potassium 3.5 - 5.1 mmol/L 4.1 4.0 4.2  Chloride  98 - 111 mmol/L 111 106 108  CO2 22 - 32 mmol/L 25 24 22   Calcium 8.9 - 10.3 mg/dL 9.2 9.6 9.3  Total Protein 6.5 - 8.1 g/dL 6.5 6.9 6.5  Total Bilirubin 0.3 - 1.2 mg/dL 0.9 1.0 0.8  Alkaline Phos 38 - 126 U/L 70 72 70  AST 15 - 41 U/L 23 26 33  ALT 0 - 44 U/L 41 50(H) 74(H)   . Lab Results  Component Value Date   LDH 247 (H) 06/16/2020     12/27/17 BM Bx:   12/27/17 Flow Cytometry:     RADIOGRAPHIC STUDIES: I have personally reviewed the radiological images as listed and agreed with the findings in the report. No results found.  ASSESSMENT & PLAN:   64 y.o. female with  1. 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 Patient appeared to be have significant constitutional symptoms . No evidence of rheumatological or infectious etiology for her fevers and night sweats. Constitutional symptoms now resolved.  2. S/p Splenomegaly - likely due to [place]. Marland Kitchenimproved on [place] abd. Clinical no palpable splenomegaly today.   04/18/18 US Abdomen revealed Splenomegaly with a length of 13.3 cm and a volume of 432 cc. By report, the spleen measured up to 15.1 cm in maximum dimension on the PET-CT from January 08, 2018 suggesting interval improvement.   3. Anemia - with elevated LDH and low haptoglobin concerning for hemolysis. Coombs neg . Could be IgA driven  Coombs neg AIHA. +ve reticulocytosis and Smear with some microspherocytes. No over urine hemosiderinuria. Partly anemia could also be from hypersplenism.  hgb normal now at 14.1  4. Low C4 ? Immune complex disease/autoimmunity related to lymphoma. Per rheumatology - no evidence of primary rheumatological disorder. No overt evidence of endocarditis.  5. h/o Fevers/chills/night sweat -- likely constitutional symptoms related to ?lymphoma - now resolved. 6. Thrombocytopenia likely from splenomegaly vs lymphoma-- resolved. PLT resolved  PLAN:  -Discussed pt labwork today, 06/16/20; blood counts and chemistries are nml,  liver enzymes have normalized, LDH is elevated but stable.  -Advised pt symptoms to be mindful for such as: fevers, chills, night sweats, unexpected weight loss, rash, itching, joint pain. -The pt shows no clinical or lab evidence of Splenic Marginal Zone Lymphoma progression at this time. -The pt has no prohibitive toxicities from continuing C12 maintenance Rituxan at this time.  -This is the last planned maintenance dose. Will continue monitoring with labs and scans.  -Recommend massage and warm compress for stiff neck muscles.  -Will repeat CT C/A/P in 10 weeks -Continue B-complex vitamin  -Refill Acyclovir -Will see back in 12 weeks with labs    FOLLOW UP: -CT Chest Abd Pelvis in 10 weeks -RTC with Dr Irene Limbo with labs in 12 weeks    The total time spent in the appt was 20 minutes and more than 50% was on counseling and direct patient cares.  All of the patient's questions were answered with apparent satisfaction. The patient knows to call the clinic with any problems, questions or concerns.    Sullivan Lone MD [place] AAHIVMS [facility] Hematology/Oncology Physician [facility]  (Office):       [phone] (Work cell):  [phone] (Fax):           [phone]  06/16/2020 11:10 AM  I, Yevette Edwards, am acting as a scribe for Dr. Sullivan Lone.   .I have reviewed the above documentation for accuracy and completeness, and I agree with the above. Brunetta Genera MD

## 2020-06-16 ENCOUNTER — Other Ambulatory Visit: Payer: Self-pay

## 2020-06-16 ENCOUNTER — Inpatient Hospital Stay (HOSPITAL_BASED_OUTPATIENT_CLINIC_OR_DEPARTMENT_OTHER): Payer: 59 | Admitting: Hematology

## 2020-06-16 ENCOUNTER — Inpatient Hospital Stay: Payer: 59

## 2020-06-16 ENCOUNTER — Inpatient Hospital Stay: Payer: 59 | Attending: Hematology and Oncology

## 2020-06-16 VITALS — BP 151/81 | HR 73 | Temp 96.9°F | Resp 18 | Ht 62.5 in | Wt 147.7 lb

## 2020-06-16 VITALS — BP 134/64 | HR 77 | Temp 98.1°F | Resp 16

## 2020-06-16 DIAGNOSIS — C8307 Small cell B-cell lymphoma, spleen: Secondary | ICD-10-CM | POA: Diagnosis not present

## 2020-06-16 DIAGNOSIS — Z5112 Encounter for antineoplastic immunotherapy: Secondary | ICD-10-CM | POA: Diagnosis not present

## 2020-06-16 DIAGNOSIS — C884 Extranodal marginal zone B-cell lymphoma of mucosa-associated lymphoid tissue [MALT-lymphoma]: Secondary | ICD-10-CM | POA: Diagnosis not present

## 2020-06-16 DIAGNOSIS — Z7189 Other specified counseling: Secondary | ICD-10-CM

## 2020-06-16 LAB — CMP (CANCER CENTER ONLY)
ALT: 41 U/L (ref 0–44)
AST: 23 U/L (ref 15–41)
Albumin: 4.2 g/dL (ref 3.5–5.0)
Alkaline Phosphatase: 70 U/L (ref 38–126)
Anion gap: 7 (ref 5–15)
BUN: 14 mg/dL (ref 8–23)
CO2: 25 mmol/L (ref 22–32)
Calcium: 9.2 mg/dL (ref 8.9–10.3)
Chloride: 111 mmol/L (ref 98–111)
Creatinine: 0.75 mg/dL (ref 0.44–1.00)
GFR, Est AFR Am: >60 mL/min (ref >60)
GFR, Estimated: >60 mL/min (ref >60)
Glucose, Bld: 119 mg/dL — ABNORMAL HIGH (ref 70–99)
Potassium: 4.1 mmol/L (ref 3.5–5.1)
Sodium: 143 mmol/L (ref 135–145)
Total Bilirubin: 0.9 mg/dL (ref 0.3–1.2)
Total Protein: 6.5 g/dL (ref 6.5–8.1)

## 2020-06-16 LAB — CBC WITH DIFFERENTIAL/PLATELET
Abs Immature Granulocytes: 0.03 10*3/uL (ref 0.00–0.07)
Basophils Absolute: 0.1 10*3/uL (ref 0.0–0.1)
Basophils Relative: 1 %
Eosinophils Absolute: 0.2 10*3/uL (ref 0.0–0.5)
Eosinophils Relative: 3 %
HCT: 42.4 % (ref 36.0–46.0)
Hemoglobin: 14.5 g/dL (ref 12.0–15.0)
Immature Granulocytes: 1 %
Lymphocytes Relative: 20 %
Lymphs Abs: 1.2 10*3/uL (ref 0.7–4.0)
MCH: 29.8 pg (ref 26.0–34.0)
MCHC: 34.2 g/dL (ref 30.0–36.0)
MCV: 87.2 fL (ref 80.0–100.0)
Monocytes Absolute: 0.7 10*3/uL (ref 0.1–1.0)
Monocytes Relative: 12 %
Neutro Abs: 3.7 10*3/uL (ref 1.7–7.7)
Neutrophils Relative %: 63 %
Platelets: 224 10*3/uL (ref 150–400)
RBC: 4.86 MIL/uL (ref 3.87–5.11)
RDW: 13 % (ref 11.5–15.5)
WBC: 5.8 10*3/uL (ref 4.0–10.5)
nRBC: 0 % (ref 0.0–0.2)

## 2020-06-16 LAB — LACTATE DEHYDROGENASE: LDH: 247 U/L — ABNORMAL HIGH (ref 98–192)

## 2020-06-16 MED ORDER — FAMOTIDINE 20 MG PO TABS
ORAL_TABLET | ORAL | Status: AC
  Filled 2020-06-16: qty 1

## 2020-06-16 MED ORDER — DIPHENHYDRAMINE HCL 25 MG PO CAPS
50.0000 mg | ORAL_CAPSULE | Freq: Once | ORAL | Status: AC
  Administered 2020-06-16: 50 mg via ORAL

## 2020-06-16 MED ORDER — ACETAMINOPHEN 325 MG PO TABS
ORAL_TABLET | ORAL | Status: AC
  Filled 2020-06-16: qty 2

## 2020-06-16 MED ORDER — SODIUM CHLORIDE 0.9 % IV SOLN
375.0000 mg/m2 | Freq: Once | INTRAVENOUS | Status: AC
  Administered 2020-06-16: 600 mg via INTRAVENOUS
  Filled 2020-06-16: qty 10

## 2020-06-16 MED ORDER — VALACYCLOVIR HCL 1 G PO TABS
1000.0000 mg | ORAL_TABLET | Freq: Two times a day (BID) | ORAL | 0 refills | Status: DC
Start: 2020-06-16 — End: 2022-07-09

## 2020-06-16 MED ORDER — DIPHENHYDRAMINE HCL 25 MG PO CAPS
ORAL_CAPSULE | ORAL | Status: AC
  Filled 2020-06-16: qty 2

## 2020-06-16 MED ORDER — METHYLPREDNISOLONE SODIUM SUCC 125 MG IJ SOLR
125.0000 mg | Freq: Every day | INTRAMUSCULAR | Status: DC
  Administered 2020-06-16: 125 mg via INTRAVENOUS

## 2020-06-16 MED ORDER — ACETAMINOPHEN 325 MG PO TABS
650.0000 mg | ORAL_TABLET | Freq: Once | ORAL | Status: AC
  Administered 2020-06-16: 650 mg via ORAL

## 2020-06-16 MED ORDER — SODIUM CHLORIDE 0.9 % IV SOLN
Freq: Once | INTRAVENOUS | Status: AC
  Filled 2020-06-16: qty 250

## 2020-06-16 MED ORDER — METHYLPREDNISOLONE SODIUM SUCC 125 MG IJ SOLR
INTRAMUSCULAR | Status: AC
  Filled 2020-06-16: qty 2

## 2020-06-16 MED ORDER — FAMOTIDINE 20 MG PO TABS
20.0000 mg | ORAL_TABLET | Freq: Once | ORAL | Status: AC
  Administered 2020-06-16: 20 mg via ORAL

## 2020-06-16 NOTE — Patient Instructions (Signed)
[facility] °Discharge Instructions for Patients Receiving Chemotherapy ° °Today you received the following chemotherapy agents: Rituximab ° °To help prevent nausea and vomiting after your treatment, we encourage you to take your nausea medication as directed.  °  °If you develop nausea and vomiting that is not controlled by your nausea medication, call the clinic.  ° °BELOW ARE SYMPTOMS THAT SHOULD BE REPORTED IMMEDIATELY: °· *FEVER GREATER THAN 100.5 F °· *CHILLS WITH OR WITHOUT FEVER °· NAUSEA AND VOMITING THAT IS NOT CONTROLLED WITH YOUR NAUSEA MEDICATION °· *UNUSUAL SHORTNESS OF BREATH °· *UNUSUAL BRUISING OR BLEEDING °· TENDERNESS IN MOUTH AND THROAT WITH OR WITHOUT PRESENCE OF ULCERS °· *URINARY PROBLEMS °· *BOWEL PROBLEMS °· UNUSUAL RASH °Items with * indicate a potential emergency and should be followed up as soon as possible. ° °Feel free to call the clinic should you have any questions or concerns. The clinic phone number is [phone]. ° °Please show the CHEMO ALERT CARD at check-in to the Emergency Department and triage nurse. ° ° °

## 2020-06-17 ENCOUNTER — Other Ambulatory Visit: Payer: 59

## 2020-06-17 ENCOUNTER — Ambulatory Visit: Payer: 59

## 2020-06-17 ENCOUNTER — Ambulatory Visit: Payer: 59 | Admitting: Hematology

## 2020-06-17 MED FILL — valACYclovir HCL 1 GM TABS: 1 | 10 days supply | Qty: 20 | Fill #0

## 2020-06-27 MED FILL — valACYclovir HCL 1 GM TABS: 1 | 30 days supply | Qty: 30 | Fill #0

## 2020-06-28 DIAGNOSIS — Z1231 Encounter for screening mammogram for malignant neoplasm of breast: Secondary | ICD-10-CM | POA: Diagnosis not present

## 2020-06-30 MED FILL — ACYCLOVIR 5% OINTMENT: 5 | 30 days supply | Qty: 15 | Fill #0

## 2020-07-04 DIAGNOSIS — Z20822 Contact with and (suspected) exposure to covid-19: Secondary | ICD-10-CM | POA: Diagnosis not present

## 2020-07-05 ENCOUNTER — Other Ambulatory Visit: Payer: 59

## 2020-07-20 DIAGNOSIS — Z23 Encounter for immunization: Secondary | ICD-10-CM | POA: Diagnosis not present

## 2020-07-20 DIAGNOSIS — I10 Essential (primary) hypertension: Secondary | ICD-10-CM | POA: Diagnosis not present

## 2020-07-20 DIAGNOSIS — Z8579 Personal history of other malignant neoplasms of lymphoid, hematopoietic and related tissues: Secondary | ICD-10-CM | POA: Diagnosis not present

## 2020-07-20 DIAGNOSIS — M542 Cervicalgia: Secondary | ICD-10-CM | POA: Diagnosis not present

## 2020-07-20 DIAGNOSIS — E786 Lipoprotein deficiency: Secondary | ICD-10-CM | POA: Diagnosis not present

## 2020-07-20 DIAGNOSIS — R7301 Impaired fasting glucose: Secondary | ICD-10-CM | POA: Diagnosis not present

## 2020-07-21 DIAGNOSIS — M542 Cervicalgia: Secondary | ICD-10-CM | POA: Diagnosis not present

## 2020-07-21 DIAGNOSIS — Z8579 Personal history of other malignant neoplasms of lymphoid, hematopoietic and related tissues: Secondary | ICD-10-CM | POA: Diagnosis not present

## 2020-07-21 DIAGNOSIS — Z23 Encounter for immunization: Secondary | ICD-10-CM | POA: Diagnosis not present

## 2020-07-21 DIAGNOSIS — E786 Lipoprotein deficiency: Secondary | ICD-10-CM | POA: Diagnosis not present

## 2020-07-21 DIAGNOSIS — R7301 Impaired fasting glucose: Secondary | ICD-10-CM | POA: Diagnosis not present

## 2020-07-21 DIAGNOSIS — I10 Essential (primary) hypertension: Secondary | ICD-10-CM | POA: Diagnosis not present

## 2020-07-26 ENCOUNTER — Encounter: Payer: Self-pay | Admitting: Hematology

## 2020-08-15 ENCOUNTER — Other Ambulatory Visit (HOSPITAL_COMMUNITY): Payer: Self-pay | Admitting: Family Medicine

## 2020-08-15 MED FILL — CELECOXIB 200 MG CAP: 200 | 30 days supply | Qty: 30 | Fill #0

## 2020-08-15 MED FILL — DICLOFENAC SODIUM 1 % GEL: 1 | 37 days supply | Qty: 300 | Fill #0

## 2020-08-15 MED FILL — LOSARTAN POTASSIUM 50 MG TA: 50 | 90 days supply | Qty: 90 | Fill #0

## 2020-08-31 ENCOUNTER — Other Ambulatory Visit (HOSPITAL_COMMUNITY): Payer: Self-pay | Admitting: Gastroenterology

## 2020-08-31 MED FILL — PANTOPRAZOLE SOD DR 40 MG T: 40 | 90 days supply | Qty: 90 | Fill #0

## 2020-09-08 ENCOUNTER — Inpatient Hospital Stay (HOSPITAL_BASED_OUTPATIENT_CLINIC_OR_DEPARTMENT_OTHER): Payer: 59 | Admitting: Hematology

## 2020-09-08 ENCOUNTER — Other Ambulatory Visit: Payer: Self-pay

## 2020-09-08 ENCOUNTER — Other Ambulatory Visit (HOSPITAL_COMMUNITY): Payer: Self-pay | Admitting: Family Medicine

## 2020-09-08 ENCOUNTER — Inpatient Hospital Stay: Payer: 59 | Attending: Hematology and Oncology

## 2020-09-08 VITALS — BP 149/85 | HR 98 | Temp 97.5°F | Resp 18 | Ht 62.0 in | Wt 153.3 lb

## 2020-09-08 DIAGNOSIS — C8307 Small cell B-cell lymphoma, spleen: Secondary | ICD-10-CM

## 2020-09-08 DIAGNOSIS — M129 Arthropathy, unspecified: Secondary | ICD-10-CM | POA: Diagnosis not present

## 2020-09-08 DIAGNOSIS — C884 Extranodal marginal zone B-cell lymphoma of mucosa-associated lymphoid tissue [MALT-lymphoma]: Secondary | ICD-10-CM | POA: Diagnosis not present

## 2020-09-08 DIAGNOSIS — I10 Essential (primary) hypertension: Secondary | ICD-10-CM | POA: Insufficient documentation

## 2020-09-08 DIAGNOSIS — R161 Splenomegaly, not elsewhere classified: Secondary | ICD-10-CM | POA: Insufficient documentation

## 2020-09-08 DIAGNOSIS — Z5112 Encounter for antineoplastic immunotherapy: Secondary | ICD-10-CM

## 2020-09-08 LAB — CBC WITH DIFFERENTIAL/PLATELET
Abs Immature Granulocytes: 0.02 10*3/uL (ref 0.00–0.07)
Basophils Absolute: 0.1 10*3/uL (ref 0.0–0.1)
Basophils Relative: 1 %
Eosinophils Absolute: 0.2 10*3/uL (ref 0.0–0.5)
Eosinophils Relative: 4 %
HCT: 46 % (ref 36.0–46.0)
Hemoglobin: 15.6 g/dL — ABNORMAL HIGH (ref 12.0–15.0)
Immature Granulocytes: 0 %
Lymphocytes Relative: 23 %
Lymphs Abs: 1.3 10*3/uL (ref 0.7–4.0)
MCH: 29.6 pg (ref 26.0–34.0)
MCHC: 33.9 g/dL (ref 30.0–36.0)
MCV: 87.3 fL (ref 80.0–100.0)
Monocytes Absolute: 0.6 10*3/uL (ref 0.1–1.0)
Monocytes Relative: 11 %
Neutro Abs: 3.4 10*3/uL (ref 1.7–7.7)
Neutrophils Relative %: 61 %
Platelets: 223 10*3/uL (ref 150–400)
RBC: 5.27 MIL/uL — ABNORMAL HIGH (ref 3.87–5.11)
RDW: 12.4 % (ref 11.5–15.5)
WBC: 5.6 10*3/uL (ref 4.0–10.5)
nRBC: 0 % (ref 0.0–0.2)

## 2020-09-08 LAB — CMP (CANCER CENTER ONLY)
ALT: 70 U/L — ABNORMAL HIGH (ref 0–44)
AST: 26 U/L (ref 15–41)
Albumin: 4.3 g/dL (ref 3.5–5.0)
Alkaline Phosphatase: 77 U/L (ref 38–126)
Anion gap: 8 (ref 5–15)
BUN: 19 mg/dL (ref 8–23)
CO2: 22 mmol/L (ref 22–32)
Calcium: 9.6 mg/dL (ref 8.9–10.3)
Chloride: 109 mmol/L (ref 98–111)
Creatinine: 0.78 mg/dL (ref 0.44–1.00)
GFR, Estimated: >60 mL/min (ref >60)
Glucose, Bld: 121 mg/dL — ABNORMAL HIGH (ref 70–99)
Potassium: 4.2 mmol/L (ref 3.5–5.1)
Sodium: 139 mmol/L (ref 135–145)
Total Bilirubin: 0.8 mg/dL (ref 0.3–1.2)
Total Protein: 6.7 g/dL (ref 6.5–8.1)

## 2020-09-08 LAB — LACTATE DEHYDROGENASE: LDH: 244 U/L — ABNORMAL HIGH (ref 98–192)

## 2020-09-08 MED FILL — ACYCLOVIR 5% OINTMENT: 5 | 5 days supply | Qty: 15 | Fill #0

## 2020-09-08 NOTE — Progress Notes (Signed)
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 She is accompanied today by three members of her family. The pt reports that she is doing well overall.   The pt notes that on March 6 she developed a low-grade fever and continues to have a fever. She has kept her fever at [place] with Tylenol every 6 hours. She notes that today she has felt the least feverish since March 6.  She saw her PCP Dr. Hulan Fess who ruled out influenza and UTI. She has also seen rheumatology over the last couple weeks and had a rheumatological workup that did not show overt evidence of a primary autoimmune condition. She was noted to be neg for ANA, RF and CCP Ab. Interesting was noted to have low C4 levels.  She notes that her resting HR has been elevated and describes her heart rate as tachycardic. She had an ECHO on 12/26/17 which showed a normal EF, and was then referred to see infectious disease: CMV and TB Quantiferon were negative.  She notes that the ID team told her there was no clear evidence of an infectious process. TTE no vegetation. BL Bcx neg.  She notes that last week she developed a cough, but believes it to be allergy related. She notes that she has had an enlarged spleen since at least October 2018 revealed by a 07/31/17 US Abdomen. She notes some abdominal discomfort, and has felt that she gets full quickly after eating.  In the last year she intentionally dropped about 20 lbs of weight with change in her diet and avoiding most carbs. She notes that in the last 3 weeks, since she began feeling ill this month, she  has lost about 6-7 lbs.   She also notes that she has been having night sweats that have returned this month after a few months of night sweats last year which abated in August 2018. She notes no other signs of recent infection, and denies any travel outside of the country. She denies any concern of insect bites.   She notes that in [place] she was on iron pills for low Hgb, which increased her Hgb. She stopped taking after her Hgb normalized.  She notes that in March 2018 she had a bad stomach bug and in December 2017 she had influenza.  She notes that she has been under a lot of stress with the deaths of her mother and step-father in a couple weeks of one another.   The pt reports that she has seen rheumatology twice for her hand, knee and hip pain but was confirmed to have osteoarthritis as opposed to rheumatoid arthritis. She notes that the severity of her joint pains mirror the content of her diet. She notes that her joint have never been red or swollen.   She notes that she will be having an iron infusion on 01/03/18.  Of note prior to the patient's visit today, pt has had BM Bx completed on 12/27/17 with results revealing HYPERCELLULAR BONE MARROW FOR AGE WITH NUMEROUS ATYPICAL LYMPHOID AGGREGATES. - TRILINEAGE HEMATOPOIESIS. On 12/27/17 the patient's flow cytometry revealed  A MINOR MONOCLONAL B-CELL POPULATION IDENTIFIED.  Flow cytometric analysis shows a minor population of monoclonal B cells (17% of all lymphocytes) expressing pan B-cell antigens including CD20 with lack of CD5, CD10, CD25 or CD103 expression. Immunohistochemical stains were also performed and show that the lymphoid aggregates show a mixture of T and B cells with slight predominance of T cells. No significant cyclin D1, CD34, or TdT positivity is identified. The overall findings are limited but slightly favor involvement by a B-cell lymphoproliferative process. Consideration was given to marginal zone lymphoma and  splenic lymphoma especially in the presence of splenomegaly. Nonetheless, additional material (ie lymph node or tissue biopsy) is strongly recommended for further evaluation.  I discussed the BM Bx results with Dr Gari Crown -- overall BM Bx + clinical picture certainly concerning for SMZL with possible coombs neg AIHA and possible autoimmune phenomenon related to [place]?Marland Kitchen  Most recent lab results (12/30/17) of CBC  is as follows: all values are WNL except for WBC at 3.5k, RBC at 3.18, Hgb at 7.6, HCT at 24.4, MCV at 76.7, MCH at 23.9, MCHC at 31.1, RDW at 17.0. LDH 12/30/17 was at 616. Haptoglobin<10 -- suggestive of hemolysis. C-reactive protein 09/10/17 was elevated at 20.5.   On review of systems, pt reports low-grade fever, night sweats, some fatigue, tachycardia, occasional lightheadedness, and denies dizziness, weakness, noticing any enlarged lymph nodes, changes in bowel habits, abdominal pains, abnormal vaginal discharge, and any other symptoms.   On PMHx the pt reports high blood pressure and degenerative arthritis. On Family Hx the pt reports her father had rheumatoid arthritis, her mother had kidney disease.  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 The patient's last visit with [place] was on 06/16/2020. The pt reports that she is doing well overall.  The pt reports that she has felt well overall. Pt continues experiencing back pain and neck stiffness. She has spoken with her PCP about her Fatty Liver who recommended an improved diet.   Lab results today (09/08/20) of CBC w/diff and CMP is as follows: all values are WNL except for RBC at 5.27, Hgb at 15.6, Glucose at 121, ALT at 70. 09/08/2020 LDH at 244  On review of systems, pt reports denies fevers, chills, night sweats, bone pain and any other symptoms.   MEDICAL HISTORY:  Past Medical History:  Diagnosis Date  . Arthritis   . Hypertension     SURGICAL HISTORY: No past surgical  history on file.  SOCIAL HISTORY: Social History   Socioeconomic History  . Marital status: Single    Spouse name: Not on file  . Number of children: Not on file  . Years of education: Not on file  . Highest education level: Not on file  Occupational History  . Not on file  Tobacco Use  . Smoking status: Never Smoker  . Smokeless tobacco: Never Used  Vaping Use  . Vaping Use: Never used  Substance and Sexual Activity  . Alcohol use: Not Currently    Alcohol/week: 0.0 standard drinks  . Drug use: Never  . Sexual activity: Not on file  Other Topics Concern  . Not on file  Social History Narrative  . Not on file   Social Determinants of Health   Financial Resource Strain:   . Difficulty of Paying Living Expenses: Not on file  Food Insecurity:   . Worried About Charity fundraiser in the Last Year: Not on file  . Ran Out  of Food in the Last Year: Not on file  Transportation Needs:   . Lack of Transportation (Medical): Not on file  . Lack of Transportation (Non-Medical): Not on file  Physical Activity:   . Days of Exercise per Week: Not on file  . Minutes of Exercise per Session: Not on file  Stress:   . Feeling of Stress : Not on file  Social Connections:   . Frequency of Communication with Friends and Family: Not on file  . Frequency of Social Gatherings with Friends and Family: Not on file  . Attends Religious Services: Not on file  . Active Member of Clubs or Organizations: Not on file  . Attends Archivist Meetings: Not on file  . Marital Status: Not on file  Intimate Partner Violence:   . Fear of Current or Ex-Partner: Not on file  . Emotionally Abused: Not on file  . Physically Abused: Not on file  . Sexually Abused: Not on file    FAMILY HISTORY: Family History  Problem Relation Age of Onset  . Diabetes Mother   . Hyperlipidemia Mother   . Hypertension Mother     ALLERGIES:  is allergic to biaxin [clarithromycin].  MEDICATIONS:  Current  Outpatient Medications  Medication Sig Dispense Refill  . folic acid (FOLVITE) 341 MCG tablet Take 800 mcg by mouth daily.     Marland Kitchen losartan (COZAAR) 50 MG tablet Take 50 mg by mouth daily.    . valACYclovir (VALTREX) 1000 MG tablet Take 1 tablet (1,000 mg total) by mouth 2 (two) times daily. 20 tablet 0  . vitamin B-12 (CYANOCOBALAMIN) 1000 MCG tablet Take 1,000 mcg by mouth daily.     No current facility-administered medications for this visit.    REVIEW OF SYSTEMS:   A 10+ POINT REVIEW OF SYSTEMS WAS OBTAINED including neurology, dermatology, psychiatry, cardiac, respiratory, lymph, extremities, GI, GU, Musculoskeletal, constitutional, breasts, reproductive, HEENT.  All pertinent positives are noted in the HPI.  All others are negative.   PHYSICAL EXAMINATION: ECOG FS:1 - Symptomatic but completely ambulatory  Vitals:   09/08/20 1027  BP: (!) 149/85  Pulse: 98  Resp: 18  Temp: (!) 97.5 F (36.4 C)  SpO2: 98%   Wt Readings from Last 3 Encounters:  09/08/20 153 lb 4.8 oz (69.5 kg)  06/16/20 147 lb 11.2 oz (67 kg)  04/19/20 145 lb 6.4 oz (66 kg)   Body mass index is 28.04 kg/m.    GENERAL:alert, in no acute distress and comfortable SKIN: no acute rashes, no significant lesions EYES: conjunctiva are pink and non-injected, sclera anicteric OROPHARYNX: MMM, no exudates, no oropharyngeal erythema or ulceration NECK: supple, no JVD LYMPH:  no palpable lymphadenopathy in the cervical, axillary or inguinal regions LUNGS: clear to auscultation b/l with normal respiratory effort HEART: regular rate & rhythm ABDOMEN:  normoactive bowel sounds , non tender, not distended. No palpable hepatosplenomegaly.  Extremity: no pedal edema PSYCH: alert & oriented x 3 with fluent speech NEURO: no focal motor/sensory deficits  LABORATORY DATA:  I have reviewed the data as listed  . CBC Latest Ref Rng & Units 09/08/2020 06/16/2020 04/19/2020  WBC 4.0 - 10.5 K/uL 5.6 5.8 5.5  Hemoglobin 12.0 -  15.0 g/dL 15.6(H) 14.5 14.8  Hematocrit 36 - 46 % 46.0 42.4 43.1  Platelets 150 - 400 K/uL 223 224 204   . CBC    Component Value Date/Time   WBC 5.6 09/08/2020 1004   RBC 5.27 (H) 09/08/2020 1004   HGB  15.6 (H) 09/08/2020 1004   HGB 14.5 07/03/2018 0835   HGB 12.8 09/10/2017 0801   HCT 46.0 09/08/2020 1004   HCT 39.4 09/10/2017 0801   PLT 223 09/08/2020 1004   PLT 183 07/03/2018 0835   PLT 144 (L) 09/10/2017 0801   MCV 87.3 09/08/2020 1004   MCV 79.0 (L) 09/10/2017 0801   MCH 29.6 09/08/2020 1004   MCHC 33.9 09/08/2020 1004   RDW 12.4 09/08/2020 1004   RDW 15.6 (H) 09/10/2017 0801   LYMPHSABS 1.3 09/08/2020 1004   LYMPHSABS 0.9 09/10/2017 0801   MONOABS 0.6 09/08/2020 1004   MONOABS 0.4 09/10/2017 0801   EOSABS 0.2 09/08/2020 1004   EOSABS 0.1 09/10/2017 0801   BASOSABS 0.1 09/08/2020 1004   BASOSABS 0.0 09/10/2017 0801    . CMP Latest Ref Rng & Units 09/08/2020 06/16/2020 04/19/2020  Glucose 70 - 99 mg/dL 121(H) 119(H) 102(H)  BUN 8 - 23 mg/dL 19 14 16   Creatinine 0.44 - 1.00 mg/dL 0.78 0.75 0.78  Sodium 135 - 145 mmol/L 139 143 141  Potassium 3.5 - 5.1 mmol/L 4.2 4.1 4.0  Chloride 98 - 111 mmol/L 109 111 106  CO2 22 - 32 mmol/L 22 25 24   Calcium 8.9 - 10.3 mg/dL 9.6 9.2 9.6  Total Protein 6.5 - 8.1 g/dL 6.7 6.5 6.9  Total Bilirubin 0.3 - 1.2 mg/dL 0.8 0.9 1.0  Alkaline Phos 38 - 126 U/L 77 70 72  AST 15 - 41 U/L 26 23 26   ALT 0 - 44 U/L 70(H) 41 50(H)   . Lab Results  Component Value Date   LDH 244 (H) 09/08/2020     12/27/17 BM Bx:   12/27/17 Flow Cytometry:     RADIOGRAPHIC STUDIES: I have personally reviewed the radiological images as listed and agreed with the findings in the report. No results found.  ASSESSMENT & PLAN:   64 y.o. female with  1. 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 Patient appeared to be have significant constitutional symptoms . No evidence of rheumatological or infectious etiology for her  fevers and night sweats. Constitutional symptoms now resolved.  2. S/p Splenomegaly - likely due to [place]. Marland Kitchenimproved on [place] abd. Clinical no palpable splenomegaly today.   04/18/18 US Abdomen revealed Splenomegaly with a length of 13.3 cm and a volume of 432 cc. By report, the spleen measured up to 15.1 cm in maximum dimension on the PET-CT from January 08, 2018 suggesting interval improvement.   3. Anemia - with elevated LDH and low haptoglobin concerning for hemolysis. Coombs neg . Could be IgA driven Coombs neg AIHA. +ve reticulocytosis and Smear with some microspherocytes. No over urine hemosiderinuria. Partly anemia could also be from hypersplenism.  hgb normal now at 14.1  4. Low C4 ? Immune complex disease/autoimmunity related to lymphoma. Per rheumatology - no evidence of primary rheumatological disorder. No overt evidence of endocarditis.  5. h/o Fevers/chills/night sweat -- likely constitutional symptoms related to ?lymphoma - now resolved. 6. Thrombocytopenia likely from splenomegaly vs lymphoma-- resolved. PLT resolved  PLAN:  -Discussed pt labwork today, 09/08/20; no anemia, WBC & PLT are nml, ALT is elevated once again, LDH is stable. -No lab or clinical evidence of SMZL progression at this time. Will continue watchful observation. -Will get CT C/A/P in 1 week for post-treatment baseline. -Continue B-complex vitamin  -Will see back in 4 months with labs   FOLLOW UP: CT chest/abd/pelvis in 1 week RTC with Dr Irene Limbo with labs in  4 months   The total time spent in the appt was 20 minutes and more than 50% was on counseling and direct patient cares.  All of the patient's questions were answered with apparent satisfaction. The patient knows to call the clinic with any problems, questions or concerns.    Sullivan Lone MD [place] AAHIVMS [facility] Hematology/Oncology Physician [facility]  (Office):       [phone] (Work cell):  [phone] (Fax):            [phone]  09/08/2020 11:31 AM  I, Yevette Edwards, am acting as a scribe for Dr. Sullivan Lone.   .I have reviewed the above documentation for accuracy and completeness, and I agree with the above. Brunetta Genera MD

## 2020-09-12 ENCOUNTER — Other Ambulatory Visit: Payer: Self-pay

## 2020-09-12 ENCOUNTER — Encounter (HOSPITAL_COMMUNITY): Payer: Self-pay

## 2020-09-12 ENCOUNTER — Ambulatory Visit (HOSPITAL_COMMUNITY)
Admission: RE | Admit: 2020-09-12 | Discharge: 2020-09-12 | Disposition: A | Discharge status: Home / Self Care | Payer: 59 | Source: Ambulatory Visit | Attending: Hematology | Admitting: Hematology

## 2020-09-12 DIAGNOSIS — K760 Fatty (change of) liver, not elsewhere classified: Secondary | ICD-10-CM | POA: Diagnosis not present

## 2020-09-12 DIAGNOSIS — R918 Other nonspecific abnormal finding of lung field: Secondary | ICD-10-CM | POA: Diagnosis not present

## 2020-09-12 DIAGNOSIS — C8307 Small cell B-cell lymphoma, spleen: Secondary | ICD-10-CM | POA: Insufficient documentation

## 2020-09-12 HISTORY — DX: Malignant (primary) neoplasm, unspecified: C80.1

## 2020-09-12 MED ORDER — IOHEXOL 300 MG/ML  SOLN
100.0000 mL | Freq: Once | INTRAMUSCULAR | Status: AC | PRN
  Administered 2020-09-12: 100 mL via INTRAVENOUS

## 2020-09-14 ENCOUNTER — Other Ambulatory Visit (HOSPITAL_COMMUNITY): Payer: Self-pay | Admitting: Family Medicine

## 2020-09-14 MED FILL — valACYclovir HCL 1 GM TABS: 1 | 30 days supply | Qty: 30 | Fill #0

## 2020-09-14 MED FILL — ACYCLOVIR 5% OINTMENT: 5 | 5 days supply | Qty: 15 | Fill #0

## 2020-10-07 MED FILL — CELECOXIB 200 MG CAP: 200 | 30 days supply | Qty: 30 | Fill #1

## 2020-10-14 DIAGNOSIS — H5203 Hypermetropia, bilateral: Secondary | ICD-10-CM | POA: Diagnosis not present

## 2020-10-14 DIAGNOSIS — H524 Presbyopia: Secondary | ICD-10-CM | POA: Diagnosis not present

## 2020-12-06 ENCOUNTER — Telehealth: Payer: Self-pay | Admitting: Family Medicine

## 2020-12-06 NOTE — Telephone Encounter (Signed)
Yes I will accept her

## 2020-12-06 NOTE — Telephone Encounter (Signed)
° ° °  Dr Quay Burow are you willing to accept Emily Foley as a new patient. She states she was referred by a current patient because Dr Rex Kras will no longer be practicing.   Please advise

## 2020-12-29 ENCOUNTER — Encounter: Payer: Self-pay | Admitting: Internal Medicine

## 2020-12-29 NOTE — Progress Notes (Signed)
Subjective:    Patient ID: Emily Foley, female    DOB: [birth year], 65 y.o.   MRN: [id number]  HPI  She is here to establish with a new pcp.  The patient is here for follow up of their chronic medical problems.   She does have some increased fatigue.  She knows this could be secondary to many things.  She does have follow-up with Dr. Irene Limbo next week and will have blood work done at that time.  She does walk her dog, but is not doing any other exercise.  She knows she needs to get back into riding her bike and doing some walking on her own.  She also knows she needs to work on weight loss.  She has gained weight over the past couple of years and plans on trying to lose that. Inc fatigue    Medications and allergies reviewed with patient and updated if appropriate.  Patient Active Problem List   Diagnosis Date Noted  . Splenic marginal zone b-cell lymphoma ([place]) 01/13/2018  . Counseling regarding advance care planning and goals of care 01/13/2018  . Fever 12/27/2017  . Iron deficiency anemia 04/29/2017  . Thrombocytopenia ([place]) 01/28/2017  . Splenomegaly 01/28/2017    Current Outpatient Medications on File Prior to Visit  Medication Sig Dispense Refill  . acyclovir ointment (ZOVIRAX) 5 % SMARTSIG:Topical 5 Times Daily PRN    . celecoxib (CELEBREX) 200 MG capsule Take 200 mg by mouth daily as needed.    . folic acid (FOLVITE) 409 MCG tablet Take 800 mcg by mouth daily.     Marland Kitchen losartan (COZAAR) 50 MG tablet Take 50 mg by mouth daily.    . pantoprazole (PROTONIX) 40 MG tablet Take 40 mg by mouth daily.    Marland Kitchen triamcinolone (KENALOG) 0.1 % triamcinolone acetonide 0.1 % topical cream  APPLY A THIN LAYER TO THE AFFECTED AREA(S) BY TOPICAL ROUTE 2 TIMES PER DAY    . valACYclovir (VALTREX) 1000 MG tablet Take 1 tablet (1,000 mg total) by mouth 2 (two) times daily. 20 tablet 0  . vitamin B-12 (CYANOCOBALAMIN) 1000 MCG tablet Take 1,000 mcg by mouth daily.     No current  facility-administered medications on file prior to visit.    Past Medical History:  Diagnosis Date  . Arthritis   . Cancer ([place])   . Hypertension     History reviewed. No pertinent surgical history.  Social History   Socioeconomic History  . Marital status: Married    Spouse name: Not on file  . Number of children: Not on file  . Years of education: Not on file  . Highest education level: Not on file  Occupational History  . Not on file  Tobacco Use  . Smoking status: Never Smoker  . Smokeless tobacco: Never Used  Vaping Use  . Vaping Use: Never used  Substance and Sexual Activity  . Alcohol use: Not Currently    Alcohol/week: 0.0 standard drinks  . Drug use: Never  . Sexual activity: Not on file  Other Topics Concern  . Not on file  Social History Narrative  . Not on file   Social Determinants of Health   Financial Resource Strain: Not on file  Food Insecurity: Not on file  Transportation Needs: Not on file  Physical Activity: Not on file  Stress: Not on file  Social Connections: Not on file    Family History  Problem Relation Age of Onset  . Diabetes Mother   .  Hyperlipidemia Mother   . Hypertension Mother     Review of Systems  Constitutional: Positive for fatigue. Negative for diaphoresis and fever.  Respiratory: Positive for shortness of breath (mild - with incline since weight gain). Negative for cough and wheezing.   Cardiovascular: Negative for palpitations and leg swelling.  Gastrointestinal: Negative for abdominal pain, blood in stool, constipation, diarrhea and nausea.       GERD occ  Musculoskeletal: Positive for arthralgias.  Neurological: Positive for headaches. Negative for dizziness and light-headedness.       Objective:   Vitals:   12/30/20 0817  BP: 140/88  Pulse: 87  Temp: 98.5 F (36.9 C)  SpO2: 97%   BP Readings from Last 3 Encounters:  12/30/20 140/88  09/08/20 (!) 149/85  06/16/20 134/64   Wt Readings from Last 3  Encounters:  12/30/20 148 lb (67.1 kg)  09/08/20 153 lb 4.8 oz (69.5 kg)  06/16/20 147 lb 11.2 oz (67 kg)   Body mass index is 27.07 kg/m.   Physical Exam Constitutional:      General: She is not in acute distress.    Appearance: Normal appearance. She is not ill-appearing.  HENT:     Head: Normocephalic and atraumatic.  Cardiovascular:     Rate and Rhythm: Normal rate and regular rhythm.     Heart sounds: No murmur heard.   Pulmonary:     Effort: Pulmonary effort is normal. No respiratory distress.     Breath sounds: No wheezing or rales.  Abdominal:     General: There is no distension.     Palpations: Abdomen is soft.     Tenderness: There is no abdominal tenderness. There is no guarding or rebound.  Musculoskeletal:     Right lower leg: Edema present.     Left lower leg: No edema.  Skin:    General: Skin is warm and dry.  Neurological:     Mental Status: She is alert.  Psychiatric:        Mood and Affect: Mood normal.            Assessment & Plan:    See Problem List for Assessment and Plan of chronic medical problems.    This visit occurred during the SARS-CoV-2 public health emergency.  Safety protocols were in place, including screening questions prior to the visit, additional usage of staff PPE, and extensive cleaning of exam room while observing appropriate contact time as indicated for disinfecting solutions.

## 2020-12-30 ENCOUNTER — Other Ambulatory Visit: Payer: Self-pay

## 2020-12-30 ENCOUNTER — Ambulatory Visit (INDEPENDENT_AMBULATORY_CARE_PROVIDER_SITE_OTHER): Payer: 59 | Admitting: Internal Medicine

## 2020-12-30 ENCOUNTER — Encounter: Payer: Self-pay | Admitting: Internal Medicine

## 2020-12-30 DIAGNOSIS — K760 Fatty (change of) liver, not elsewhere classified: Secondary | ICD-10-CM | POA: Diagnosis not present

## 2020-12-30 DIAGNOSIS — I10 Essential (primary) hypertension: Secondary | ICD-10-CM | POA: Diagnosis not present

## 2020-12-30 DIAGNOSIS — M1990 Unspecified osteoarthritis, unspecified site: Secondary | ICD-10-CM | POA: Insufficient documentation

## 2020-12-30 DIAGNOSIS — M159 Polyosteoarthritis, unspecified: Secondary | ICD-10-CM

## 2020-12-30 DIAGNOSIS — C8307 Small cell B-cell lymphoma, spleen: Secondary | ICD-10-CM | POA: Diagnosis not present

## 2020-12-30 DIAGNOSIS — M8949 Other hypertrophic osteoarthropathy, multiple sites: Secondary | ICD-10-CM

## 2020-12-30 DIAGNOSIS — K219 Gastro-esophageal reflux disease without esophagitis: Secondary | ICD-10-CM | POA: Diagnosis not present

## 2020-12-30 NOTE — Assessment & Plan Note (Signed)
Chronic Intermittent Takes pantoprazole 40 mg as needed only

## 2020-12-30 NOTE — Assessment & Plan Note (Signed)
Chronic Blood pressure well controlled at home-she does monitor on a regular basis Continue losartan 50 mg daily If her blood pressure is not controlled she will let me know and can increase losartan to 50 mg twice daily

## 2020-12-30 NOTE — Assessment & Plan Note (Signed)
Chronic Take Celebrex as needed only

## 2020-12-30 NOTE — Patient Instructions (Signed)
It was nice to meet you.   I will see you in one year.

## 2021-01-05 NOTE — Progress Notes (Signed)
HEMATOLOGY/ONCOLOGY CLINIC NOTE  Date of Service: 01/06/21    Patient Care Team: Binnie Rail, MD as PCP - General (Internal Medicine)  CHIEF COMPLAINTS/PURPOSE OF CONSULTATION:  F/u for mx of SMZL and maintenance R[place]  HISTORY OF PRESENTING ILLNESS:   Emily Foley is a wonderful 65 y.o. 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 She is accompanied today by three members of her family. The pt reports that she is doing well overall.   The pt notes that on March 6 she developed a low-grade fever and continues to have a fever. She has kept her fever at [place] with Tylenol every 6 hours. She notes that today she has felt the least feverish since March 6.  She saw her PCP Dr. Hulan Foley who ruled out influenza and UTI. She has also seen rheumatology over the last couple weeks and had a rheumatological workup that did not show overt evidence of a primary autoimmune condition. She was noted to be neg for ANA, RF and CCP Ab. Interesting was noted to have low C4 levels.  She notes that her resting HR has been elevated and describes her heart rate as tachycardic. She had an ECHO on 12/26/17 which showed a normal EF, and was then referred to see infectious disease: CMV and TB Quantiferon were negative.  She notes that the ID team told her there was no clear evidence of an infectious process. TTE no vegetation. BL Bcx neg.  She notes that last week she developed a cough, but believes it to be allergy related. She notes that she has had an enlarged spleen since at least October 2018 revealed by a 07/31/17 US Abdomen. She notes some abdominal discomfort, and has felt that she gets full quickly after eating.  In the last year she intentionally dropped about 20 lbs of weight with change in her diet and avoiding most carbs. She notes that in the last 3 weeks, since she began feeling ill this month,  she has lost about 6-7 lbs.   She also notes that she has been having night sweats that have returned this month after a few months of night sweats last year which abated in August 2018. She notes no other signs of recent infection, and denies any travel outside of the country. She denies any concern of insect bites.   She notes that in [place] she was on iron pills for low Hgb, which increased her Hgb. She stopped taking after her Hgb normalized.  She notes that in March 2018 she had a bad stomach bug and in December 2017 she had influenza.  She notes that she has been under a lot of stress with the deaths of her mother and step-father in a couple weeks of one another.   The pt reports that she has seen rheumatology twice for her hand, knee and hip pain but was confirmed to have osteoarthritis as opposed to rheumatoid arthritis. She notes that the severity of her joint pains mirror the content of her diet. She notes that her joint have never been red or swollen.   She notes that she will be having an iron infusion on 01/03/18.  Of note prior to the patient's visit today, pt has had BM Bx completed on 12/27/17 with results revealing HYPERCELLULAR BONE MARROW FOR AGE WITH NUMEROUS ATYPICAL LYMPHOID AGGREGATES. - TRILINEAGE HEMATOPOIESIS. On 12/27/17 the patient's flow cytometry  revealed A MINOR MONOCLONAL B-CELL POPULATION IDENTIFIED.  Flow cytometric analysis shows a minor population of monoclonal B cells (17% of all lymphocytes) expressing pan B-cell antigens including CD20 with lack of CD5, CD10, CD25 or CD103 expression. Immunohistochemical stains were also performed and show that the lymphoid aggregates show a mixture of T and B cells with slight predominance of T cells. No significant cyclin D1, CD34, or TdT positivity is identified. The overall findings are limited but slightly favor involvement by a B-cell lymphoproliferative process. Consideration was given to marginal zone lymphoma  and splenic lymphoma especially in the presence of splenomegaly. Nonetheless, additional material (ie lymph node or tissue biopsy) is strongly recommended for further evaluation.  I discussed the BM Bx results with Dr Emily Foley -- overall BM Bx + clinical picture certainly concerning for SMZL with possible coombs neg AIHA and possible autoimmune phenomenon related to [place]?Marland Kitchen  Most recent lab results (12/30/17) of CBC  is as follows: all values are WNL except for WBC at 3.5k, RBC at 3.18, Hgb at 7.6, HCT at 24.4, MCV at 76.7, MCH at 23.9, MCHC at 31.1, RDW at 17.0. LDH 12/30/17 was at 616. Haptoglobin<10 -- suggestive of hemolysis. C-reactive protein 09/10/17 was elevated at 20.5.   On review of systems, pt reports low-grade fever, night sweats, some fatigue, tachycardia, occasional lightheadedness, and denies dizziness, weakness, noticing any enlarged lymph nodes, changes in bowel habits, abdominal pains, abnormal vaginal discharge, and any other symptoms.   On PMHx the pt reports high blood pressure and degenerative arthritis. On Family Hx the pt reports her father had rheumatoid arthritis, her mother had kidney disease.  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 The patient's last visit with [place] was on 09/08/2020. The pt reports that she is doing well overall.  The pt reports no new symptoms or concerns. The pt notes that she has been getting more fatigued more quickly lately.  Of note since the patient's last visit, pt has had CT Chest Abd Pel on 09/12/2020, which revealed "1. No evidence of recurrent lymphoma. 2. Hepatic steatosis. 3.  Aortic atherosclerosis (ICD10-I70.0)."  Lab results today 01/06/2021 of CBC w/diff and CMP is as follows: all values are WNL except for Glucose of 109, Calcium of 8.5, Total Protein of 6.3, ALT of 50. 01/06/2021 LDH of 209.  On review of systems, pt reports fatigue and denies acute stiff/painful joints, new  lumps/bumps, SOB, abdominal pain, changes in bowel habits, leg swelling, and any other symptoms.  MEDICAL HISTORY:  Past Medical History:  Diagnosis Date  . Arthritis   . Cancer ([place])   . Hypertension     SURGICAL HISTORY: No past surgical history on file.  SOCIAL HISTORY: Social History   Socioeconomic History  . Marital status: Married    Spouse name: Not on file  . Number of children: Not on file  . Years of education: Not on file  . Highest education level: Not on file  Occupational History  . Not on file  Tobacco Use  . Smoking status: Never Smoker  . Smokeless tobacco: Never Used  Vaping Use  . Vaping Use: Never used  Substance and Sexual Activity  . Alcohol use: Not Currently    Alcohol/week: 0.0 standard drinks  . Drug use: Never  . Sexual activity: Not on file  Other Topics Concern  . Not on file  Social History Narrative  . Not on file   Social Determinants of Health  Financial Resource Strain: Not on file  Food Insecurity: Not on file  Transportation Needs: Not on file  Physical Activity: Not on file  Stress: Not on file  Social Connections: Not on file  Intimate Partner Violence: Not on file    FAMILY HISTORY: Family History  Problem Relation Age of Onset  . Diabetes Mother   . Hyperlipidemia Mother   . Hypertension Mother   . Rheum arthritis Brother   . Diabetes Brother     ALLERGIES:  is allergic to biaxin [clarithromycin].  MEDICATIONS:  Current Outpatient Medications  Medication Sig Dispense Refill  . acyclovir ointment (ZOVIRAX) 5 % SMARTSIG:Topical 5 Times Daily PRN    . celecoxib (CELEBREX) 200 MG capsule Take 200 mg by mouth daily as needed.    . folic acid (FOLVITE) 409 MCG tablet Take 800 mcg by mouth daily.     Marland Kitchen losartan (COZAAR) 50 MG tablet Take 50 mg by mouth daily.    . pantoprazole (PROTONIX) 40 MG tablet Take 40 mg by mouth daily.    Marland Kitchen triamcinolone (KENALOG) 0.1 % triamcinolone acetonide 0.1 % topical cream  APPLY  A THIN LAYER TO THE AFFECTED AREA(S) BY TOPICAL ROUTE 2 TIMES PER DAY    . valACYclovir (VALTREX) 1000 MG tablet Take 1 tablet (1,000 mg total) by mouth 2 (two) times daily. 20 tablet 0  . vitamin B-12 (CYANOCOBALAMIN) 1000 MCG tablet Take 1,000 mcg by mouth daily.     No current facility-administered medications for this visit.    REVIEW OF SYSTEMS:   10 Point review of Systems was done is negative except as noted above.  PHYSICAL EXAMINATION: ECOG FS:1 - Symptomatic but completely ambulatory  Vitals:   01/06/21 1031  BP: (!) 143/78  Pulse: 73  Resp: 18  Temp: 97.7 F (36.5 C)  SpO2: 100%   Wt Readings from Last 3 Encounters:  01/06/21 149 lb 14.4 oz (68 kg)  12/30/20 148 lb (67.1 kg)  09/08/20 153 lb 4.8 oz (69.5 kg)   Body mass index is 27.42 kg/m.     GENERAL:alert, in no acute distress and comfortable SKIN: no acute rashes, no significant lesions EYES: conjunctiva are pink and non-injected, sclera anicteric OROPHARYNX: MMM, no exudates, no oropharyngeal erythema or ulceration NECK: supple, no JVD LYMPH:  no palpable lymphadenopathy in the cervical, axillary or inguinal regions LUNGS: clear to auscultation b/l with normal respiratory effort HEART: regular rate & rhythm ABDOMEN:  normoactive bowel sounds , non tender, not distended. Extremity: no pedal edema PSYCH: alert & oriented x 3 with fluent speech NEURO: no focal motor/sensory deficits  LABORATORY DATA:  I have reviewed the data as listed  . CBC Latest Ref Rng & Units 01/06/2021 09/08/2020 06/16/2020  WBC 4.0 - 10.5 K/uL 5.8 5.6 5.8  Hemoglobin 12.0 - 15.0 g/dL 14.6 15.6(H) 14.5  Hematocrit 36.0 - 46.0 % 42.6 46.0 42.4  Platelets 150 - 400 K/uL 221 223 224   . CBC    Component Value Date/Time   WBC 5.8 01/06/2021 0955   RBC 4.88 01/06/2021 0955   HGB 14.6 01/06/2021 0955   HGB 14.5 07/03/2018 0835   HGB 12.8 09/10/2017 0801   HCT 42.6 01/06/2021 0955   HCT 39.4 09/10/2017 0801   PLT 221 01/06/2021  0955   PLT 183 07/03/2018 0835   PLT 144 (L) 09/10/2017 0801   MCV 87.3 01/06/2021 0955   MCV 79.0 (L) 09/10/2017 0801   MCH 29.9 01/06/2021 0955   MCHC 34.3 01/06/2021 0955  RDW 12.5 01/06/2021 0955   RDW 15.6 (H) 09/10/2017 0801   LYMPHSABS 1.2 01/06/2021 0955   LYMPHSABS 0.9 09/10/2017 0801   MONOABS 0.5 01/06/2021 0955   MONOABS 0.4 09/10/2017 0801   EOSABS 0.1 01/06/2021 0955   EOSABS 0.1 09/10/2017 0801   BASOSABS 0.0 01/06/2021 0955   BASOSABS 0.0 09/10/2017 0801    . CMP Latest Ref Rng & Units 01/06/2021 09/08/2020 06/16/2020  Glucose 70 - 99 mg/dL 109(H) 121(H) 119(H)  BUN 8 - 23 mg/dL 16 19 14   Creatinine 0.44 - 1.00 mg/dL 0.76 0.78 0.75  Sodium 135 - 145 mmol/L 140 139 143  Potassium 3.5 - 5.1 mmol/L 4.1 4.2 4.1  Chloride 98 - 111 mmol/L 108 109 111  CO2 22 - 32 mmol/L 22 22 25   Calcium 8.9 - 10.3 mg/dL 8.5(L) 9.6 9.2  Total Protein 6.5 - 8.1 g/dL 6.3(L) 6.7 6.5  Total Bilirubin 0.3 - 1.2 mg/dL 0.9 0.8 0.9  Alkaline Phos 38 - 126 U/L 72 77 70  AST 15 - 41 U/L 25 26 23   ALT 0 - 44 U/L 50(H) 70(H) 41   . Lab Results  Component Value Date   LDH 209 (H) 01/06/2021     12/27/17 BM Bx:   12/27/17 Flow Cytometry:     RADIOGRAPHIC STUDIES: I have personally reviewed the radiological images as listed and agreed with the findings in the report. No results found.  ASSESSMENT & PLAN:   65 y.o. female with  1. 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 Patient appeared to be have significant constitutional symptoms . No evidence of rheumatological or infectious etiology for her fevers and night sweats. Constitutional symptoms now resolved.  2. S/p Splenomegaly - likely due to [place]. Marland Kitchenimproved on [place] abd. Clinical no palpable splenomegaly today.   04/18/18 US Abdomen revealed Splenomegaly with a length of 13.3 cm and a volume of 432 cc. By report, the spleen measured up to 15.1 cm in maximum dimension on the PET-CT from January 08, 2018  suggesting interval improvement.   3. Anemia - with elevated LDH and low haptoglobin concerning for hemolysis. Coombs neg . Could be IgA driven Coombs neg AIHA. +ve reticulocytosis and Smear with some microspherocytes. No over urine hemosiderinuria. Partly anemia could also be from hypersplenism.  hgb normal now at 14.1  4. Low C4 ? Immune complex disease/autoimmunity related to lymphoma. Per rheumatology - no evidence of primary rheumatological disorder. No overt evidence of endocarditis.  5. h/o Fevers/chills/night sweat -- likely constitutional symptoms related to ?lymphoma - now resolved. 6. Thrombocytopenia likely from splenomegaly vs lymphoma-- resolved. PLT resolved   PLAN:  -Discussed pt labwork today, 01/06/2021; blood counts all normal, chemistries stable, LDH improved. -Recommended pt stay physically active and eat healthy.  -Recommended pt receive second booster. -Discussed Evusheld and pt's eligibility. Will send referral. -Advised pt that antibody producing machinery responds back after six months of finishing immunosuppressive treatment. -No lab or clinical evidence of SMZL progression at this time. Will continue watchful observation. -Start 2000 IU Vitamin D daily. -Continue B-complex vitamin. -Will see back in 4 months with labs.   FOLLOW UP: RTC with Dr Irene Limbo with labs in 4 months   The total time spent in the appointment was 20 minutes and more than 50% was on counseling and direct patient cares.   All of the patient's questions were answered with apparent satisfaction. The patient knows to call the clinic with any problems, questions or concerns.  Sullivan Lone MD [place] AAHIVMS [facility] Hematology/Oncology Physician [facility]  (Office):       [phone] (Work cell):  [phone] (Fax):           [phone]  01/06/2021 11:09 AM  I, Reinaldo Raddle, am acting as scribe for Dr. Sullivan Lone, MD.     .I have reviewed the above documentation for  accuracy and completeness, and I agree with the above. Brunetta Genera MD

## 2021-01-06 ENCOUNTER — Inpatient Hospital Stay: Payer: 59 | Attending: Hematology and Oncology

## 2021-01-06 ENCOUNTER — Other Ambulatory Visit: Payer: Self-pay

## 2021-01-06 ENCOUNTER — Inpatient Hospital Stay (HOSPITAL_BASED_OUTPATIENT_CLINIC_OR_DEPARTMENT_OTHER): Payer: 59 | Admitting: Hematology

## 2021-01-06 VITALS — BP 143/78 | HR 73 | Temp 97.7°F | Resp 18 | Ht 62.0 in | Wt 149.9 lb

## 2021-01-06 DIAGNOSIS — D649 Anemia, unspecified: Secondary | ICD-10-CM | POA: Insufficient documentation

## 2021-01-06 DIAGNOSIS — C8307 Small cell B-cell lymphoma, spleen: Secondary | ICD-10-CM | POA: Diagnosis not present

## 2021-01-06 DIAGNOSIS — C884 Extranodal marginal zone B-cell lymphoma of mucosa-associated lymphoid tissue [MALT-lymphoma]: Secondary | ICD-10-CM | POA: Insufficient documentation

## 2021-01-06 DIAGNOSIS — Z5112 Encounter for antineoplastic immunotherapy: Secondary | ICD-10-CM

## 2021-01-06 LAB — CBC WITH DIFFERENTIAL/PLATELET
Abs Immature Granulocytes: 0.01 10*3/uL (ref 0.00–0.07)
Basophils Absolute: 0 10*3/uL (ref 0.0–0.1)
Basophils Relative: 1 %
Eosinophils Absolute: 0.1 10*3/uL (ref 0.0–0.5)
Eosinophils Relative: 2 %
HCT: 42.6 % (ref 36.0–46.0)
Hemoglobin: 14.6 g/dL (ref 12.0–15.0)
Immature Granulocytes: 0 %
Lymphocytes Relative: 21 %
Lymphs Abs: 1.2 10*3/uL (ref 0.7–4.0)
MCH: 29.9 pg (ref 26.0–34.0)
MCHC: 34.3 g/dL (ref 30.0–36.0)
MCV: 87.3 fL (ref 80.0–100.0)
Monocytes Absolute: 0.5 10*3/uL (ref 0.1–1.0)
Monocytes Relative: 9 %
Neutro Abs: 3.9 10*3/uL (ref 1.7–7.7)
Neutrophils Relative %: 67 %
Platelets: 221 10*3/uL (ref 150–400)
RBC: 4.88 MIL/uL (ref 3.87–5.11)
RDW: 12.5 % (ref 11.5–15.5)
WBC: 5.8 10*3/uL (ref 4.0–10.5)
nRBC: 0 % (ref 0.0–0.2)

## 2021-01-06 LAB — CMP (CANCER CENTER ONLY)
ALT: 50 U/L — ABNORMAL HIGH (ref 0–44)
AST: 25 U/L (ref 15–41)
Albumin: 4.3 g/dL (ref 3.5–5.0)
Alkaline Phosphatase: 72 U/L (ref 38–126)
Anion gap: 10 (ref 5–15)
BUN: 16 mg/dL (ref 8–23)
CO2: 22 mmol/L (ref 22–32)
Calcium: 8.5 mg/dL — ABNORMAL LOW (ref 8.9–10.3)
Chloride: 108 mmol/L (ref 98–111)
Creatinine: 0.76 mg/dL (ref 0.44–1.00)
GFR, Estimated: >60 mL/min (ref >60)
Glucose, Bld: 109 mg/dL — ABNORMAL HIGH (ref 70–99)
Potassium: 4.1 mmol/L (ref 3.5–5.1)
Sodium: 140 mmol/L (ref 135–145)
Total Bilirubin: 0.9 mg/dL (ref 0.3–1.2)
Total Protein: 6.3 g/dL — ABNORMAL LOW (ref 6.5–8.1)

## 2021-01-06 LAB — LACTATE DEHYDROGENASE: LDH: 209 U/L — ABNORMAL HIGH (ref 98–192)

## 2021-01-09 ENCOUNTER — Telehealth: Payer: Self-pay | Admitting: Hematology

## 2021-01-09 NOTE — Telephone Encounter (Signed)
Left message with follow-up appointment per 4/1 los. Gave option to call back to reschedule if needed.

## 2021-01-15 ENCOUNTER — Telehealth: Payer: Self-pay | Admitting: Physician Assistant

## 2021-01-15 NOTE — Telephone Encounter (Signed)
Called pt about Evusheld (tixagevimab co-packaged with cilgavimab) for pre-exposure prophylaxis for prevention of coronavirus disease 2019 (COVID-19) caused by the SARS-CoV-2 virus. The patient is a candidate for this therapy given increased risk for severe disease caused by immunosuppression.    Unable to reach pt- left VM and mychart message.   Matthewjames Petrasek PA-C  [facility]     

## 2021-02-06 ENCOUNTER — Telehealth: Payer: Self-pay | Admitting: Adult Health

## 2021-02-06 NOTE — Telephone Encounter (Signed)
Attempt #2 to discuss Evusheld.  No answer and VM is full, unable to leave message.    We are happy to discuss and arrange evusheld with the patient in the future.  Wilber Bihari, NP

## 2021-02-09 ENCOUNTER — Other Ambulatory Visit (HOSPITAL_BASED_OUTPATIENT_CLINIC_OR_DEPARTMENT_OTHER): Payer: Self-pay

## 2021-02-09 ENCOUNTER — Other Ambulatory Visit (HOSPITAL_COMMUNITY): Payer: Self-pay

## 2021-02-09 MED FILL — Losartan Potassium Tab 50 MG: ORAL | 90 days supply | Qty: 90 | Fill #0 | Status: CN

## 2021-02-17 ENCOUNTER — Other Ambulatory Visit (HOSPITAL_COMMUNITY): Payer: Self-pay

## 2021-02-17 MED FILL — Losartan Potassium Tab 50 MG: ORAL | 90 days supply | Qty: 90 | Fill #0 | Status: AC

## 2021-03-20 ENCOUNTER — Other Ambulatory Visit (HOSPITAL_COMMUNITY): Payer: Self-pay

## 2021-03-20 ENCOUNTER — Telehealth (INDEPENDENT_AMBULATORY_CARE_PROVIDER_SITE_OTHER): Payer: 59 | Admitting: Internal Medicine

## 2021-03-20 ENCOUNTER — Encounter: Payer: Self-pay | Admitting: Internal Medicine

## 2021-03-20 DIAGNOSIS — J019 Acute sinusitis, unspecified: Secondary | ICD-10-CM | POA: Insufficient documentation

## 2021-03-20 DIAGNOSIS — R140 Abdominal distension (gaseous): Secondary | ICD-10-CM | POA: Insufficient documentation

## 2021-03-20 DIAGNOSIS — J0100 Acute maxillary sinusitis, unspecified: Secondary | ICD-10-CM

## 2021-03-20 MED ORDER — ONDANSETRON 4 MG PO TBDP
4.0000 mg | ORAL_TABLET | Freq: Three times a day (TID) | ORAL | 0 refills | Status: DC | PRN
  Filled 2021-03-20: qty 20, 7d supply, fill #0

## 2021-03-20 MED ORDER — DOXYCYCLINE HYCLATE 100 MG PO TABS
100.0000 mg | ORAL_TABLET | Freq: Two times a day (BID) | ORAL | 0 refills | Status: DC
Start: 2021-03-20 — End: 2021-03-24
  Filled 2021-03-20: qty 20, 10d supply, fill #0

## 2021-03-20 NOTE — Assessment & Plan Note (Signed)
Acute Likely bacterial  Start doxycycline 100 mg BID x 10 day otc cold medications Alternate Tylenol and ibuprofen Rest, fluid Call if no improvement

## 2021-03-20 NOTE — Progress Notes (Signed)
Virtual Visit via Video Note  I connected with Emily Foley on 03/20/21 at  3:40 PM EDT by a video enabled telemedicine application and verified that I am speaking with the correct person using two identifiers.   I discussed the limitations of evaluation and management by telemedicine and the availability of in person appointments. The patient expressed understanding and agreed to proceed.  Present for the visit:  Myself, Dr Billey Gosling, Aline Brochure.  The patient is currently at home and I am in the office.    No referring provider.    History of Present Illness: This is an acute visit for cold symptoms.  Her symptoms started 3 days ago.  She initially just felt achy.  Next couple of days she felt nauseous, aches, fever up to 101.5, nasal congestion, headaches, teeth pain, little bit of ear pain, postnasal drainage.  She has taken Tylenol.  Her oxygen saturation has been good.  She took 2 home COVID test and they have been negative.  She is also noted that when she eats she gets full very quickly.  She feels bloated.  She denies any constipation or reflux.      Review of Systems  Constitutional:  Positive for diaphoresis and fever.  HENT:  Positive for congestion, ear pain, sinus pain and sore throat.   Respiratory:  Negative for cough and shortness of breath.   Musculoskeletal:  Positive for myalgias.  Neurological:  Positive for headaches.      Social History   Socioeconomic History   Marital status: Married    Spouse name: Not on file   Number of children: Not on file   Years of education: Not on file   Highest education level: Not on file  Occupational History   Not on file  Tobacco Use   Smoking status: Never   Smokeless tobacco: Never  Vaping Use   Vaping Use: Never used  Substance and Sexual Activity   Alcohol use: Not Currently    Alcohol/week: 0.0 standard drinks   Drug use: Never   Sexual activity: Not on file  Other Topics Concern   Not on file   Social History Narrative   Not on file   Social Determinants of Health   Financial Resource Strain: Not on file  Food Insecurity: Not on file  Transportation Needs: Not on file  Physical Activity: Not on file  Stress: Not on file  Social Connections: Not on file     Observations/Objective: Appears well in NAD Breathing normally  Assessment and Plan:  See Problem List for Assessment and Plan of chronic medical problems.   Follow Up Instructions:    I discussed the assessment and treatment plan with the patient. The patient was provided an opportunity to ask questions and all were answered. The patient agreed with the plan and demonstrated an understanding of the instructions.   The patient was advised to call back or seek an in-person evaluation if the symptoms worsen or if the condition fails to improve as anticipated.    Binnie Rail, MD

## 2021-03-20 NOTE — Assessment & Plan Note (Signed)
New Has been experiencing abdominal bloating and feeling full quickly when she eats If this does not resolve should be evaluated further-she states she did experience this when she had splenomegaly She also states diaphoresis, which likely related to her fevers She will let me know if this does not improve or schedule an earlier follow-up with Dr. Irene Limbo

## 2021-03-21 ENCOUNTER — Telehealth: Payer: 59 | Admitting: Internal Medicine

## 2021-03-22 ENCOUNTER — Encounter: Payer: Self-pay | Admitting: Internal Medicine

## 2021-03-23 ENCOUNTER — Encounter: Payer: Self-pay | Admitting: Internal Medicine

## 2021-03-24 ENCOUNTER — Other Ambulatory Visit: Payer: Self-pay | Admitting: Internal Medicine

## 2021-03-24 ENCOUNTER — Other Ambulatory Visit (HOSPITAL_COMMUNITY): Payer: Self-pay

## 2021-03-24 DIAGNOSIS — J0101 Acute recurrent maxillary sinusitis: Secondary | ICD-10-CM

## 2021-03-24 DIAGNOSIS — J0100 Acute maxillary sinusitis, unspecified: Secondary | ICD-10-CM

## 2021-03-24 MED ORDER — AMOXICILLIN-POT CLAVULANATE 875-125 MG PO TABS
1.0000 | ORAL_TABLET | Freq: Two times a day (BID) | ORAL | 0 refills | Status: AC
  Filled 2021-03-24: qty 20, 10d supply, fill #0

## 2021-03-24 NOTE — Telephone Encounter (Signed)
Follow up message    Patient calling for advice  Temp 99.3 Cough Sore throat Headache Sinus pressure Neg covid home test  6/15

## 2021-03-29 ENCOUNTER — Other Ambulatory Visit (HOSPITAL_COMMUNITY): Payer: Self-pay

## 2021-03-29 ENCOUNTER — Encounter: Payer: Self-pay | Admitting: Internal Medicine

## 2021-04-06 ENCOUNTER — Encounter: Payer: Self-pay | Admitting: Hematology

## 2021-04-06 NOTE — Progress Notes (Signed)
Subjective:    Patient ID: Emily Foley, female    DOB: 06-11-1956, 65 y.o.   MRN: [id number]  HPI The patient is here for an acute visit.   Swollen, painful ear -about 1 week ago her left inner ear started to swell and felt like there is fluid in it.  It is tender on the inside.  Couple of days ago the whole [place] closed and she had difficulty hearing.  The symptoms have continued.  She did have something similar years ago when it was an infection.     Medications and allergies reviewed with patient and updated if appropriate.  Patient Active Problem List   Diagnosis Date Noted   Acute sinus infection 03/20/2021   Abdominal bloating 03/20/2021   Hypertension 12/30/2020   GERD (gastroesophageal reflux disease) 12/30/2020   Hepatic steatosis 12/30/2020   Osteoarthritis 12/30/2020   Splenic marginal zone b-cell lymphoma ([place]) 01/13/2018   Counseling regarding advance care planning and goals of care 01/13/2018   Iron deficiency anemia 04/29/2017   Thrombocytopenia ([place]) 01/28/2017   Splenomegaly 01/28/2017    Current Outpatient Medications on File Prior to Visit  Medication Sig Dispense Refill   acyclovir ointment (ZOVIRAX) 5 % APPLY TO THE AFFECTED AREA(S) 5 TIMES A DAY AS NEEDED 15 g 5   celecoxib (CELEBREX) 200 MG capsule TAKE 1 CAPSULE BY MOUTH ONCE DAILY WITH FOOD AS NEEDED 30 capsule 1   diclofenac Sodium (VOLTAREN) 1 % GEL APPLY 2 GRAMS TO UPPER EXTREMITY JOINT EVERY 6 HOURS AS NEEDED 364 g 1   folic acid (FOLVITE) 680 MCG tablet Take 800 mcg by mouth daily.      losartan (COZAAR) 50 MG tablet TAKE 1 TABLET BY MOUTH ONCE DAILY 90 tablet 3   ondansetron (ZOFRAN ODT) 4 MG disintegrating tablet Dissolve 1 tablet (4 mg total) by mouth every 8 (eight) hours as needed for nausea or vomiting. 20 tablet 0   pantoprazole (PROTONIX) 40 MG tablet TAKE 1 TABLET BY MOUTH ONCE A DAY 90 tablet 4   triamcinolone (KENALOG) 0.1 % triamcinolone acetonide 0.1 % topical cream  APPLY A  THIN LAYER TO THE AFFECTED AREA(S) BY TOPICAL ROUTE 2 TIMES PER DAY     valACYclovir (VALTREX) 1000 MG tablet Take 1 tablet (1,000 mg total) by mouth 2 (two) times daily. 20 tablet 0   vitamin B-12 (CYANOCOBALAMIN) 1000 MCG tablet Take 1,000 mcg by mouth daily.     No current facility-administered medications on file prior to visit.    Past Medical History:  Diagnosis Date   Arthritis    Cancer ([place])    Hypertension     History reviewed. No pertinent surgical history.  Social History   Socioeconomic History   Marital status: Married    Spouse name: Not on file   Number of children: Not on file   Years of education: Not on file   Highest education level: Not on file  Occupational History   Not on file  Tobacco Use   Smoking status: Never   Smokeless tobacco: Never  Vaping Use   Vaping Use: Never used  Substance and Sexual Activity   Alcohol use: Not Currently    Alcohol/week: 0.0 standard drinks   Drug use: Never   Sexual activity: Not on file  Other Topics Concern   Not on file  Social History Narrative   Not on file   Social Determinants of Health   Financial Resource Strain: Not on file  Food Insecurity:  Not on file  Transportation Needs: Not on file  Physical Activity: Not on file  Stress: Not on file  Social Connections: Not on file    Family History  Problem Relation Age of Onset   Diabetes Mother    Hyperlipidemia Mother    Hypertension Mother    Rheum arthritis Brother    Diabetes Brother     Review of Systems  Constitutional:  Negative for chills and fever.  HENT:  Positive for ear discharge (Left ear), ear pain and hearing loss (Only 1 day). Negative for congestion, sinus pressure, sinus pain and sore throat.   Respiratory:  Negative for cough, shortness of breath and wheezing.   Neurological:  Negative for dizziness and headaches.      Objective:   Vitals:   04/07/21 1304  BP: (!) 148/82  Pulse: 84  Temp: 99.2 F (37.3 C)  SpO2: 98%    BP Readings from Last 3 Encounters:  04/07/21 (!) 148/82  01/06/21 (!) 143/78  12/30/20 140/88   Wt Readings from Last 3 Encounters:  04/07/21 145 lb (65.8 kg)  01/06/21 149 lb 14.4 oz (68 kg)  12/30/20 148 lb (67.1 kg)   Body mass index is 26.52 kg/m.   Physical Exam    GENERAL APPEARANCE: Appears stated age, well appearing, NAD EYES: conjunctiva clear, no icterus HENT: Right ear canal and tympanic membrane normal, left ear canal with erythema, swelling and slight watery discharge, TM partially visualized and normal, oropharynx with no erythema or exudates, trachea midline, no cervical or supraclavicular lymphadenopathy SKIN: Warm, dry      Assessment & Plan:    See Problem List for Assessment and Plan of chronic medical problems.    This visit occurred during the SARS-CoV-2 public health emergency.  Safety protocols were in place, including screening questions prior to the visit, additional usage of staff PPE, and extensive cleaning of exam room while observing appropriate contact time as indicated for disinfecting solutions.

## 2021-04-07 ENCOUNTER — Ambulatory Visit: Payer: 59 | Admitting: Internal Medicine

## 2021-04-07 ENCOUNTER — Other Ambulatory Visit: Payer: Self-pay

## 2021-04-07 ENCOUNTER — Encounter: Payer: Self-pay | Admitting: Internal Medicine

## 2021-04-07 ENCOUNTER — Other Ambulatory Visit (HOSPITAL_COMMUNITY): Payer: Self-pay

## 2021-04-07 DIAGNOSIS — H60392 Other infective otitis externa, left ear: Secondary | ICD-10-CM | POA: Diagnosis not present

## 2021-04-07 DIAGNOSIS — Z792 Long term (current) use of antibiotics: Secondary | ICD-10-CM | POA: Diagnosis not present

## 2021-04-07 DIAGNOSIS — H6092 Unspecified otitis externa, left ear: Secondary | ICD-10-CM | POA: Insufficient documentation

## 2021-04-07 MED ORDER — AMOXICILLIN 500 MG PO CAPS
ORAL_CAPSULE | ORAL | 0 refills | Status: DC
  Filled 2021-04-07: qty 4, 1d supply, fill #0

## 2021-04-07 MED ORDER — NEOMYCIN-POLYMYXIN-HC 3.5-10000-1 OT SOLN
4.0000 [drp] | Freq: Four times a day (QID) | OTIC | 0 refills | Status: DC
  Filled 2021-04-07: qty 10, 13d supply, fill #0

## 2021-04-07 NOTE — Patient Instructions (Signed)

## 2021-04-07 NOTE — Assessment & Plan Note (Signed)
To have dental work-requires antibiotic prophylaxis Amoxicillin 500 mg-4 caps 1 hour prior to dental work

## 2021-04-07 NOTE — Assessment & Plan Note (Signed)
Acute Symptoms and exam consistent with left otitis externa Cortisporin otic 4 drops 4 times daily to left ear Call if no improvement

## 2021-05-07 NOTE — Progress Notes (Signed)
HEMATOLOGY/ONCOLOGY CLINIC NOTE  Date of Service: 05/07/21    Patient Care Team: Binnie Rail, MD as PCP - General (Internal Medicine)  CHIEF COMPLAINTS/PURPOSE OF CONSULTATION:  F/u for mx of SMZL   HISTORY OF PRESENTING ILLNESS:   Emily Foley is a wonderful 65 y.o. 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 She is accompanied today by three members of her family. The pt reports that she is doing well overall.   The pt notes that on March 6 she developed a low-grade fever and continues to have a fever. She has kept her fever at [place] with Tylenol every 6 hours. She notes that today she has felt the least feverish since March 6.  She saw her PCP Dr. Hulan Fess who ruled out influenza and UTI. She has also seen rheumatology over the last couple weeks and had a rheumatological workup that did not show overt evidence of a primary autoimmune condition. She was noted to be neg for ANA, RF and CCP Ab. Interesting was noted to have low C4 levels.  She notes that her resting HR has been elevated and describes her heart rate as tachycardic. She had an ECHO on 12/26/17 which showed a normal EF, and was then referred to see infectious disease: CMV and TB Quantiferon were negative.  She notes that the ID team told her there was no clear evidence of an infectious process. TTE no vegetation. BL Bcx neg.  She notes that last week she developed a cough, but believes it to be allergy related. She notes that she has had an enlarged spleen since at least October 2018 revealed by a 07/31/17 US Abdomen. She notes some abdominal discomfort, and has felt that she gets full quickly after eating.  In the last year she intentionally dropped about 20 lbs of weight with change in her diet and avoiding most carbs. She notes that in the last 3 weeks, since she began feeling ill this month, she has lost about 6-7  lbs.   She also notes that she has been having night sweats that have returned this month after a few months of night sweats last year which abated in August 2018. She notes no other signs of recent infection, and denies any travel outside of the country. She denies any concern of insect bites.   She notes that in [place] she was on iron pills for low Hgb, which increased her Hgb. She stopped taking after her Hgb normalized.  She notes that in March 2018 she had a bad stomach bug and in December 2017 she had influenza.  She notes that she has been under a lot of stress with the deaths of her mother and step-father in a couple weeks of one another.   The pt reports that she has seen rheumatology twice for her hand, knee and hip pain but was confirmed to have osteoarthritis as opposed to rheumatoid arthritis. She notes that the severity of her joint pains mirror the content of her diet. She notes that her joint have never been red or swollen.   She notes that she will be having an iron infusion on 01/03/18.  Of note prior to the patient's visit today, pt has had BM Bx completed on 12/27/17 with results revealing HYPERCELLULAR BONE MARROW FOR AGE WITH NUMEROUS ATYPICAL LYMPHOID AGGREGATES. - TRILINEAGE HEMATOPOIESIS. On 12/27/17 the patient's flow cytometry revealed A  MINOR MONOCLONAL B-CELL POPULATION IDENTIFIED.  Flow cytometric analysis shows a minor population of monoclonal B cells (17% of all lymphocytes) expressing pan B-cell antigens including CD20 with lack of CD5, CD10, CD25 or CD103 expression. Immunohistochemical stains were also performed and show that the lymphoid aggregates show a mixture of T and B cells with slight predominance of T cells. No significant cyclin D1, CD34, or TdT positivity is identified. The overall findings are limited but slightly favor involvement by a B-cell lymphoproliferative process. Consideration was given to marginal zone lymphoma and splenic lymphoma  especially in the presence of splenomegaly. Nonetheless, additional material (ie lymph node or tissue biopsy) is strongly recommended for further evaluation.  I discussed the BM Bx results with Dr Gari Crown -- overall BM Bx + clinical picture certainly concerning for SMZL with possible coombs neg AIHA and possible autoimmune phenomenon related to [place]?Marland Kitchen  Most recent lab results (12/30/17) of CBC  is as follows: all values are WNL except for WBC at 3.5k, RBC at 3.18, Hgb at 7.6, HCT at 24.4, MCV at 76.7, MCH at 23.9, MCHC at 31.1, RDW at 17.0. LDH 12/30/17 was at 616. Haptoglobin<10 -- suggestive of hemolysis. C-reactive protein 09/10/17 was elevated at 20.5.   On review of systems, pt reports low-grade fever, night sweats, some fatigue, tachycardia, occasional lightheadedness, and denies dizziness, weakness, noticing any enlarged lymph nodes, changes in bowel habits, abdominal pains, abnormal vaginal discharge, and any other symptoms.   On PMHx the pt reports high blood pressure and degenerative arthritis. On Family Hx the pt reports her father had rheumatoid arthritis, her mother had kidney disease.  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 The patient's last visit with [place] was on 01/06/2021. The pt reports that she is doing well overall.  The pt reports no acute new concerns. Has been feeling well. No fevers/chills/night sweats , unexpected weight loss. Good energy levels.  Lab results today 05/08/2021 of CBC w/diff and CMP reviewed with patient  On review of systems, pt reports no other acute new symptoms.   MEDICAL HISTORY:  Past Medical History:  Diagnosis Date   Arthritis    Cancer ([place])    Hypertension     SURGICAL HISTORY: No past surgical history on file.  SOCIAL HISTORY: Social History   Socioeconomic History   Marital status: Married    Spouse name: Not on file   Number of children: Not on file   Years of education:  Not on file   Highest education level: Not on file  Occupational History   Not on file  Tobacco Use   Smoking status: Never   Smokeless tobacco: Never  Vaping Use   Vaping Use: Never used  Substance and Sexual Activity   Alcohol use: Not Currently    Alcohol/week: 0.0 standard drinks   Drug use: Never   Sexual activity: Not on file  Other Topics Concern   Not on file  Social History Narrative   Not on file   Social Determinants of Health   Financial Resource Strain: Not on file  Food Insecurity: Not on file  Transportation Needs: Not on file  Physical Activity: Not on file  Stress: Not on file  Social Connections: Not on file  Intimate Partner Violence: Not on file    FAMILY HISTORY: Family History  Problem Relation Age of Onset   Diabetes Mother    Hyperlipidemia Mother    Hypertension Mother    Rheum arthritis Brother  Diabetes Brother     ALLERGIES:  is allergic to biaxin [clarithromycin] and doxycycline.  MEDICATIONS:  Current Outpatient Medications  Medication Sig Dispense Refill   acyclovir ointment (ZOVIRAX) 5 % APPLY TO THE AFFECTED AREA(S) 5 TIMES A DAY AS NEEDED 15 g 5   amoxicillin (AMOXIL) 500 MG capsule Take 4 capsules by mouth one hour prior to dental visit 4 capsule 0   celecoxib (CELEBREX) 200 MG capsule TAKE 1 CAPSULE BY MOUTH ONCE DAILY WITH FOOD AS NEEDED 30 capsule 1   diclofenac Sodium (VOLTAREN) 1 % GEL APPLY 2 GRAMS TO UPPER EXTREMITY JOINT EVERY 6 HOURS AS NEEDED XX123456 g 1   folic acid (FOLVITE) Q000111Q MCG tablet Take 800 mcg by mouth daily.      losartan (COZAAR) 50 MG tablet TAKE 1 TABLET BY MOUTH ONCE DAILY 90 tablet 3   neomycin-polymyxin-hydrocortisone (CORTISPORIN) OTIC solution Place 4 drops into the left ear 4 (four) times daily. 10 mL 0   ondansetron (ZOFRAN ODT) 4 MG disintegrating tablet Dissolve 1 tablet (4 mg total) by mouth every 8 (eight) hours as needed for nausea or vomiting. 20 tablet 0   pantoprazole (PROTONIX) 40 MG tablet  TAKE 1 TABLET BY MOUTH ONCE A DAY 90 tablet 4   triamcinolone (KENALOG) 0.1 % triamcinolone acetonide 0.1 % topical cream  APPLY A THIN LAYER TO THE AFFECTED AREA(S) BY TOPICAL ROUTE 2 TIMES PER DAY     valACYclovir (VALTREX) 1000 MG tablet Take 1 tablet (1,000 mg total) by mouth 2 (two) times daily. 20 tablet 0   vitamin B-12 (CYANOCOBALAMIN) 1000 MCG tablet Take 1,000 mcg by mouth daily.     No current facility-administered medications for this visit.    REVIEW OF SYSTEMS:   10 Point review of Systems was done is negative except as noted above.  PHYSICAL EXAMINATION: ECOG FS:1 - Symptomatic but completely ambulatory  Vitals:   05/08/21 1513  BP: (!) 162/86  Pulse: 73  Resp: 16  Temp: 97.8 F (36.6 C)  SpO2: 98%    Wt Readings from Last 3 Encounters:  04/07/21 145 lb (65.8 kg)  01/06/21 149 lb 14.4 oz (68 kg)  12/30/20 148 lb (67.1 kg)   Body mass index is 26.85 kg/m.    NAD GENERAL:alert, in no acute distress and comfortable SKIN: no acute rashes, no significant lesions EYES: conjunctiva are pink and non-injected, sclera anicteric OROPHARYNX: MMM, no exudates, no oropharyngeal erythema or ulceration NECK: supple, no JVD LYMPH:  no palpable lymphadenopathy in the cervical, axillary or inguinal regions LUNGS: clear to auscultation b/l with normal respiratory effort HEART: regular rate & rhythm ABDOMEN:  normoactive bowel sounds , non tender, not distended. Extremity: no pedal edema PSYCH: alert & oriented x 3 with fluent speech NEURO: no focal motor/sensory deficits  LABORATORY DATA:  I have reviewed the data as listed  . CBC Latest Ref Rng & Units 05/08/2021 01/06/2021 09/08/2020  WBC 4.0 - 10.5 K/uL 6.2 5.8 5.6  Hemoglobin 12.0 - 15.0 g/dL 14.7 14.6 15.6(H)  Hematocrit 36.0 - 46.0 % 42.5 42.6 46.0  Platelets 150 - 400 K/uL 247 221 223   . CBC    Component Value Date/Time   WBC 6.2 05/08/2021 1441   RBC 4.93 05/08/2021 1441   HGB 14.7 05/08/2021 1441   HGB  14.5 07/03/2018 0835   HGB 12.8 09/10/2017 0801   HCT 42.5 05/08/2021 1441   HCT 39.4 09/10/2017 0801   PLT 247 05/08/2021 1441   PLT 183 07/03/2018  0835   PLT 144 (L) 09/10/2017 0801   MCV 86.2 05/08/2021 1441   MCV 79.0 (L) 09/10/2017 0801   MCH 29.8 05/08/2021 1441   MCHC 34.6 05/08/2021 1441   RDW 12.8 05/08/2021 1441   RDW 15.6 (H) 09/10/2017 0801   LYMPHSABS 1.6 05/08/2021 1441   LYMPHSABS 0.9 09/10/2017 0801   MONOABS 0.6 05/08/2021 1441   MONOABS 0.4 09/10/2017 0801   EOSABS 0.1 05/08/2021 1441   EOSABS 0.1 09/10/2017 0801   BASOSABS 0.1 05/08/2021 1441   BASOSABS 0.0 09/10/2017 0801    . CMP Latest Ref Rng & Units 05/08/2021 01/06/2021 09/08/2020  Glucose 70 - 99 mg/dL 96 109(H) 121(H)  BUN 8 - 23 mg/dL '17 16 19  '$ Creatinine 0.44 - 1.00 mg/dL 0.76 0.76 0.78  Sodium 135 - 145 mmol/L 141 140 139  Potassium 3.5 - 5.1 mmol/L 4.1 4.1 4.2  Chloride 98 - 111 mmol/L 109 108 109  CO2 22 - 32 mmol/L '24 22 22  '$ Calcium 8.9 - 10.3 mg/dL 9.6 8.5(L) 9.6  Total Protein 6.5 - 8.1 g/dL 6.6 6.3(L) 6.7  Total Bilirubin 0.3 - 1.2 mg/dL 0.7 0.9 0.8  Alkaline Phos 38 - 126 U/L 91 72 77  AST 15 - 41 U/L '25 25 26  '$ ALT 0 - 44 U/L 47(H) 50(H) 70(H)   . Lab Results  Component Value Date   LDH 209 (H) 01/06/2021   . Lab Results  Component Value Date   LDH 247 (H) 05/08/2021     12/27/17 BM Bx:   12/27/17 Flow Cytometry:     RADIOGRAPHIC STUDIES: I have personally reviewed the radiological images as listed and agreed with the findings in the report. No results found.  ASSESSMENT & PLAN:   65 y.o. female with  1. 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 Patient appeared to be have significant constitutional symptoms . No evidence of rheumatological or infectious etiology for her fevers and night sweats. Constitutional symptoms now resolved.  2. S/p Splenomegaly - likely due to [place]. Marland Kitchenimproved on [place] abd. Clinical no palpable splenomegaly today.    04/18/18 US Abdomen revealed Splenomegaly with a length of 13.3 cm and a volume of 432 cc. By report, the spleen measured up to 15.1 cm in maximum dimension on the PET-CT from January 08, 2018 suggesting interval improvement.   3. S/p Anemia - with elevated LDH and low haptoglobin concerning for hemolysis. Coombs neg . Could be IgA driven Coombs neg AIHA. +ve reticulocytosis and Smear with some microspherocytes. No over urine hemosiderinuria. Partly anemia could also be from hypersplenism.  hgb normal now at 14.7  4. Low C4 ? Immune complex disease/autoimmunity related to lymphoma. Per rheumatology - no evidence of primary rheumatological disorder. No overt evidence of endocarditis.  5. h/o Fevers/chills/night sweat -- likely constitutional symptoms related to ?lymphoma - now resolved. 6. Thrombocytopenia likely from splenomegaly vs lymphoma-- resolved. PLT resolved   PLAN:  -Discussed pt labwork today, 05/08/2021; cbc wnl, CMP unremarkable. -LDH chronically borderline elevated - no acute changes. -No lab or clinical evidence of SMZL progression at this time. Will continue watchful observation. -continue 2000 IU Vitamin D daily. -Continue B-complex vitamin. -Will see back in 70month  FOLLOW UP: RTC with Dr KIrene Limbowith labs in 6 months   The total time spent in the appointment was 20 minutes and more than 50% was on counseling and direct patient cares.   All of the patient's questions were answered with apparent satisfaction. The patient  knows to call the clinic with any problems, questions or concerns.    Sullivan Lone MD [place] AAHIVMS [facility] Hematology/Oncology Physician [facility]  (Office):       [phone] (Work cell):  [phone] (Fax):           [phone]  05/07/2021 10:58 PM  I, Reinaldo Raddle, am acting as scribe for Dr. Sullivan Lone, MD.     .I have reviewed the above documentation for accuracy and completeness, and I agree with the above. Brunetta Genera  MD

## 2021-05-08 ENCOUNTER — Inpatient Hospital Stay: Payer: 59

## 2021-05-08 ENCOUNTER — Other Ambulatory Visit: Payer: Self-pay

## 2021-05-08 ENCOUNTER — Inpatient Hospital Stay: Payer: 59 | Attending: Hematology | Admitting: Hematology

## 2021-05-08 VITALS — BP 162/86 | HR 73 | Temp 97.8°F | Resp 16 | Ht 62.0 in | Wt 146.8 lb

## 2021-05-08 DIAGNOSIS — Z8572 Personal history of non-Hodgkin lymphomas: Secondary | ICD-10-CM | POA: Insufficient documentation

## 2021-05-08 DIAGNOSIS — D649 Anemia, unspecified: Secondary | ICD-10-CM | POA: Diagnosis not present

## 2021-05-08 DIAGNOSIS — C8307 Small cell B-cell lymphoma, spleen: Secondary | ICD-10-CM

## 2021-05-08 LAB — CMP (CANCER CENTER ONLY)
ALT: 47 U/L — ABNORMAL HIGH (ref 0–44)
AST: 25 U/L (ref 15–41)
Albumin: 4.4 g/dL (ref 3.5–5.0)
Alkaline Phosphatase: 91 U/L (ref 38–126)
Anion gap: 8 (ref 5–15)
BUN: 17 mg/dL (ref 8–23)
CO2: 24 mmol/L (ref 22–32)
Calcium: 9.6 mg/dL (ref 8.9–10.3)
Chloride: 109 mmol/L (ref 98–111)
Creatinine: 0.76 mg/dL (ref 0.44–1.00)
GFR, Estimated: >60 mL/min (ref >60)
Glucose, Bld: 96 mg/dL (ref 70–99)
Potassium: 4.1 mmol/L (ref 3.5–5.1)
Sodium: 141 mmol/L (ref 135–145)
Total Bilirubin: 0.7 mg/dL (ref 0.3–1.2)
Total Protein: 6.6 g/dL (ref 6.5–8.1)

## 2021-05-08 LAB — CBC WITH DIFFERENTIAL/PLATELET
Abs Immature Granulocytes: 0.01 10*3/uL (ref 0.00–0.07)
Basophils Absolute: 0.1 10*3/uL (ref 0.0–0.1)
Basophils Relative: 1 %
Eosinophils Absolute: 0.1 10*3/uL (ref 0.0–0.5)
Eosinophils Relative: 2 %
HCT: 42.5 % (ref 36.0–46.0)
Hemoglobin: 14.7 g/dL (ref 12.0–15.0)
Immature Granulocytes: 0 %
Lymphocytes Relative: 25 %
Lymphs Abs: 1.6 10*3/uL (ref 0.7–4.0)
MCH: 29.8 pg (ref 26.0–34.0)
MCHC: 34.6 g/dL (ref 30.0–36.0)
MCV: 86.2 fL (ref 80.0–100.0)
Monocytes Absolute: 0.6 10*3/uL (ref 0.1–1.0)
Monocytes Relative: 10 %
Neutro Abs: 3.9 10*3/uL (ref 1.7–7.7)
Neutrophils Relative %: 62 %
Platelets: 247 10*3/uL (ref 150–400)
RBC: 4.93 MIL/uL (ref 3.87–5.11)
RDW: 12.8 % (ref 11.5–15.5)
WBC: 6.2 10*3/uL (ref 4.0–10.5)
nRBC: 0 % (ref 0.0–0.2)

## 2021-05-08 LAB — LACTATE DEHYDROGENASE: LDH: 247 U/L — ABNORMAL HIGH (ref 98–192)

## 2021-05-09 ENCOUNTER — Telehealth: Payer: Self-pay | Admitting: Hematology

## 2021-05-09 NOTE — Telephone Encounter (Signed)
Scheduled follow-up appointment per 8/1 los. Patient is aware. 

## 2021-05-14 ENCOUNTER — Encounter: Payer: Self-pay | Admitting: Hematology

## 2021-05-20 ENCOUNTER — Other Ambulatory Visit (HOSPITAL_COMMUNITY): Payer: Self-pay

## 2021-05-20 MED FILL — Losartan Potassium Tab 50 MG: ORAL | 90 days supply | Qty: 90 | Fill #1 | Status: AC

## 2021-06-02 ENCOUNTER — Encounter: Payer: Self-pay | Admitting: Internal Medicine

## 2021-06-02 ENCOUNTER — Other Ambulatory Visit: Payer: Self-pay

## 2021-06-02 ENCOUNTER — Ambulatory Visit: Payer: 59 | Admitting: Internal Medicine

## 2021-06-02 ENCOUNTER — Other Ambulatory Visit (HOSPITAL_COMMUNITY): Payer: Self-pay

## 2021-06-02 VITALS — BP 130/72 | HR 80 | Temp 98.8°F | Ht 62.0 in | Wt 143.2 lb

## 2021-06-02 DIAGNOSIS — H60312 Diffuse otitis externa, left ear: Secondary | ICD-10-CM | POA: Diagnosis not present

## 2021-06-02 MED ORDER — METHYLPREDNISOLONE 4 MG PO TBPK
ORAL_TABLET | ORAL | 0 refills | Status: DC
  Filled 2021-06-02: qty 21, 6d supply, fill #0

## 2021-06-02 MED ORDER — AMOXICILLIN-POT CLAVULANATE 875-125 MG PO TABS
1.0000 | ORAL_TABLET | Freq: Two times a day (BID) | ORAL | 0 refills | Status: DC
Start: 2021-06-02 — End: 2021-06-13
  Filled 2021-06-02: qty 20, 10d supply, fill #0

## 2021-06-02 NOTE — Progress Notes (Signed)
Subjective:    Patient ID: Emily Foley, female    DOB: 10/16/1955, 65 y.o.   MRN: [id number]  HPI The patient is here for an acute visit.  She was here almost 2 months ago with left otitis externa.  She states the topical antibiotic did help, but it still would get scabby.  She would occasionally have an exudate.  It was itchy and at times she would pick at the scab and she thinks she may have caused things to get worse.  Recently she did put a Q-tip in the ear canal and was scratching it and she thinks she really irritated it.  The ear canal is swollen.  She is having some swelling just anterior to the ear and down into the neck and the area is also tender.  She is concerned that the infection is worse and that she may have caused her to get worse.   Medications and allergies reviewed with patient and updated if appropriate.  Patient Active Problem List   Diagnosis Date Noted   Otitis externa of left ear 04/07/2021   Preventive antibiotic-dental work 04/07/2021   Acute sinus infection 03/20/2021   Abdominal bloating 03/20/2021   Hypertension 12/30/2020   GERD (gastroesophageal reflux disease) 12/30/2020   Hepatic steatosis 12/30/2020   Osteoarthritis 12/30/2020   Splenic marginal zone b-cell lymphoma ([place]) 01/13/2018   Counseling regarding advance care planning and goals of care 01/13/2018   Iron deficiency anemia 04/29/2017   Thrombocytopenia ([place]) 01/28/2017   Splenomegaly 01/28/2017    Current Outpatient Medications on File Prior to Visit  Medication Sig Dispense Refill   acyclovir ointment (ZOVIRAX) 5 % APPLY TO THE AFFECTED AREA(S) 5 TIMES A DAY AS NEEDED 15 g 5   amoxicillin (AMOXIL) 500 MG capsule Take 4 capsules by mouth one hour prior to dental visit 4 capsule 0   celecoxib (CELEBREX) 200 MG capsule TAKE 1 CAPSULE BY MOUTH ONCE DAILY WITH FOOD AS NEEDED 30 capsule 1   diclofenac Sodium (VOLTAREN) 1 % GEL APPLY 2 GRAMS TO UPPER EXTREMITY JOINT EVERY 6 HOURS AS  NEEDED XX123456 g 1   folic acid (FOLVITE) Q000111Q MCG tablet Take 800 mcg by mouth daily.      losartan (COZAAR) 50 MG tablet TAKE 1 TABLET BY MOUTH ONCE DAILY 90 tablet 3   neomycin-polymyxin-hydrocortisone (CORTISPORIN) OTIC solution Place 4 drops into the left ear 4 (four) times daily. 10 mL 0   ondansetron (ZOFRAN ODT) 4 MG disintegrating tablet Dissolve 1 tablet (4 mg total) by mouth every 8 (eight) hours as needed for nausea or vomiting. 20 tablet 0   pantoprazole (PROTONIX) 40 MG tablet TAKE 1 TABLET BY MOUTH ONCE A DAY 90 tablet 4   triamcinolone (KENALOG) 0.1 % triamcinolone acetonide 0.1 % topical cream  APPLY A THIN LAYER TO THE AFFECTED AREA(S) BY TOPICAL ROUTE 2 TIMES PER DAY     valACYclovir (VALTREX) 1000 MG tablet Take 1 tablet (1,000 mg total) by mouth 2 (two) times daily. 20 tablet 0   vitamin B-12 (CYANOCOBALAMIN) 1000 MCG tablet Take 1,000 mcg by mouth daily.     No current facility-administered medications on file prior to visit.    Past Medical History:  Diagnosis Date   Arthritis    Cancer ([place])    Hypertension     No past surgical history on file.  Social History   Socioeconomic History   Marital status: Married    Spouse name: Not on file   Number  of children: Not on file   Years of education: Not on file   Highest education level: Not on file  Occupational History   Not on file  Tobacco Use   Smoking status: Never   Smokeless tobacco: Never  Vaping Use   Vaping Use: Never used  Substance and Sexual Activity   Alcohol use: Not Currently    Alcohol/week: 0.0 standard drinks   Drug use: Never   Sexual activity: Not on file  Other Topics Concern   Not on file  Social History Narrative   Not on file   Social Determinants of Health   Financial Resource Strain: Not on file  Food Insecurity: Not on file  Transportation Needs: Not on file  Physical Activity: Not on file  Stress: Not on file  Social Connections: Not on file    Family History  Problem  Relation Age of Onset   Diabetes Mother    Hyperlipidemia Mother    Hypertension Mother    Rheum arthritis Brother    Diabetes Brother     Review of Systems  Constitutional:  Positive for chills. Negative for fever.  HENT:  Positive for ear pain and hearing loss. Negative for congestion, sinus pain and sore throat.       Objective:   Vitals:   06/02/21 1058  BP: 130/72  Pulse: 80  Temp: 98.8 F (37.1 C)  SpO2: 98%   BP Readings from Last 3 Encounters:  06/02/21 130/72  05/08/21 (!) 162/86  04/07/21 (!) 148/82   Wt Readings from Last 3 Encounters:  06/02/21 143 lb 3.2 oz (65 kg)  05/08/21 146 lb 12.8 oz (66.6 kg)  04/07/21 145 lb (65.8 kg)   Body mass index is 26.19 kg/m.   Physical Exam Constitutional:      General: She is not in acute distress.    Appearance: Normal appearance. She is not ill-appearing.  HENT:     Head: Normocephalic and atraumatic.     Right Ear: Tympanic membrane, ear canal and external ear normal. There is no impacted cerumen.     Ears:     Comments: Left ear canal swollen shut-I am not able to look in the ear canal at all.  Mild swelling anterior to left ear and down into the neck Musculoskeletal:     Cervical back: Neck supple. Tenderness (Mild anterior to left ear and just below ear and neck) present.  Lymphadenopathy:     Cervical: Cervical adenopathy (Left cervical lymphadenopathy) present.  Skin:    General: Skin is warm and dry.  Neurological:     Mental Status: She is alert.           Assessment & Plan:    See Problem List for Assessment and Plan of chronic medical problems.    This visit occurred during the SARS-CoV-2 public health emergency.  Safety protocols were in place, including screening questions prior to the visit, additional usage of staff PPE, and extensive cleaning of exam room while observing appropriate contact time as indicated for disinfecting solutions.

## 2021-06-02 NOTE — Assessment & Plan Note (Signed)
Acute Significant swelling of the left ear canal-I am unable to even look in the canal at all likely secondary to otitis externa, cannot exclude otitis media at the same time Medrol Dosepak Augmentin 875-125 p.o. twice daily x10 days When she is able she will start using the eardrops she has at home She will make an appointment to see ENT-if she is not able to see ENT she will come in so that I can relook in her ear

## 2021-06-02 NOTE — Patient Instructions (Signed)
     Medications changes include :  augmentin and prednisone    Your prescription(s) have been submitted to your pharmacy. Please take as directed and contact our office if you believe you are having problem(s) with the medication(s).

## 2021-06-05 ENCOUNTER — Ambulatory Visit: Payer: 59 | Admitting: Internal Medicine

## 2021-06-09 ENCOUNTER — Encounter: Payer: Self-pay | Admitting: Internal Medicine

## 2021-06-12 NOTE — Patient Instructions (Addendum)
    Medications changes include :   zyrtec, flonase, ciprodex

## 2021-06-12 NOTE — Progress Notes (Signed)
Subjective:    Patient ID: Emily Foley, female    DOB: Oct 16, 1955, 65 y.o.   MRN: [id number]  HPI The patient is here for follow up ear infection.   Left otitis externa -she completed the Augmentin and did take the prednisone.  The prednisone did not help significantly with the ear canal swelling.  She used the drops she had had before.  There was some improvement, but she was still having symptoms.  She was able to get into see ENT today and saw Dr. Benjamine Mola.  He cleaned out the ear and she has a persistent otitis externa any prescribed Ciprodex.  She will follow-up with him in 2 weeks.  She has taken Zyrtec on occasion and it does seem to help.   Medications and allergies reviewed with patient and updated if appropriate.  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 20 tablet 0   celecoxib (CELEBREX) 200 MG capsule TAKE 1 CAPSULE BY MOUTH ONCE DAILY WITH FOOD AS NEEDED 30 capsule 1   diclofenac Sodium (VOLTAREN) 1 % GEL APPLY 2 GRAMS TO UPPER EXTREMITY JOINT EVERY 6 HOURS AS  NEEDED [id number] g 1   folic acid (FOLVITE) Q000111Q MCG tablet Take 800 mcg by mouth daily.      losartan (COZAAR) 50 MG tablet TAKE 1 TABLET BY MOUTH ONCE DAILY 90 tablet 3   methylPREDNISolone (MEDROL DOSEPAK) 4 MG TBPK tablet Take as directed per package instructions.  (6 tablets on day 1, then decrease by 1 tablet every day until finished). 21 tablet 0   neomycin-polymyxin-hydrocortisone (CORTISPORIN) OTIC solution Place 4 drops into the left ear 4 (four) times daily. 10 mL 0   ondansetron (ZOFRAN ODT) 4 MG disintegrating tablet Dissolve 1 tablet (4 mg total) by mouth every 8 (eight) hours as needed for nausea or vomiting. 20 tablet 0   pantoprazole (PROTONIX) 40 MG tablet TAKE 1 TABLET BY MOUTH ONCE A DAY 90 tablet 4   triamcinolone (KENALOG) 0.1 % triamcinolone acetonide 0.1 % topical cream  APPLY A THIN LAYER TO THE AFFECTED AREA(S) BY TOPICAL ROUTE 2 TIMES PER DAY     valACYclovir (VALTREX) 1000 MG tablet Take 1 tablet (1,000 mg total) by mouth 2 (two) times daily. 20 tablet 0   vitamin B-12 (CYANOCOBALAMIN) 1000 MCG tablet Take 1,000 mcg by mouth daily.     No current facility-administered medications on file prior to visit.    Past Medical History:  Diagnosis Date   Arthritis    Cancer ([place])    Hypertension     History reviewed. No pertinent  surgical history.  Social History   Socioeconomic History   Marital status: Married    Spouse name: Not on file   Number of children: Not on file   Years of education: Not on file   Highest education level: Not on file  Occupational History   Not on file  Tobacco Use   Smoking status: Never   Smokeless tobacco: Never  Vaping Use   Vaping Use: Never used  Substance and Sexual Activity   Alcohol use: Not Currently    Alcohol/week: 0.0 standard drinks   Drug use: Never   Sexual activity: Not on file  Other Topics Concern   Not on file  Social History Narrative   Not on file   Social Determinants of Health   Financial Resource  Strain: Not on file  Food Insecurity: Not on file  Transportation Needs: Not on file  Physical Activity: Not on file  Stress: Not on file  Social Connections: Not on file    Family History  Problem Relation Age of Onset   Diabetes Mother    Hyperlipidemia Mother    Hypertension Mother    Rheum arthritis Brother    Diabetes Brother     Review of Systems  Constitutional:  Negative for fever.  HENT:  Positive for ear discharge, ear pain and hearing loss (Decreased and some hyper sensitivity). Negative for sinus pressure.   Neurological:  Negative for headaches.      Objective:   Vitals:   06/13/21 1436  BP: 124/78  Pulse: 82  Temp: 98.4 F (36.9 C)  SpO2: 96%   BP Readings from Last 3 Encounters:  06/13/21 124/78  06/02/21 130/72  05/08/21 (!) 162/86   Wt Readings from Last 3 Encounters:  06/13/21 142 lb (64.4 kg)  06/02/21 143 lb 3.2 oz (65 kg)  05/08/21 146 lb 12.8 oz (66.6 kg)   Body mass index is 25.97 kg/m.   Physical Exam Constitutional:      Appearance: Normal appearance.  HENT:     Head: Normocephalic and atraumatic.     Ears:     Comments: Left ear canal with mild erythema and swelling, debris and yellow exudate present, tympanic membrane erythematous and swollen Musculoskeletal:     Cervical back: Neck supple. No tenderness.  Lymphadenopathy:     Cervical: No cervical adenopathy.  Skin:    General: Skin is warm and dry.  Neurological:     Mental Status: She is alert.           Assessment & Plan:    See Problem List for Assessment and Plan of chronic medical problems.    This visit occurred during the SARS-CoV-2 public health emergency.  Safety protocols were in place, including screening questions prior to the visit, additional usage of staff PPE, and extensive cleaning of exam room while observing appropriate contact time as indicated for disinfecting solutions.

## 2021-06-13 ENCOUNTER — Encounter: Payer: Self-pay | Admitting: Internal Medicine

## 2021-06-13 ENCOUNTER — Other Ambulatory Visit: Payer: Self-pay

## 2021-06-13 ENCOUNTER — Ambulatory Visit: Payer: 59 | Admitting: Internal Medicine

## 2021-06-13 ENCOUNTER — Other Ambulatory Visit (HOSPITAL_COMMUNITY): Payer: Self-pay

## 2021-06-13 DIAGNOSIS — H60312 Diffuse otitis externa, left ear: Secondary | ICD-10-CM

## 2021-06-13 DIAGNOSIS — H6122 Impacted cerumen, left ear: Secondary | ICD-10-CM | POA: Diagnosis not present

## 2021-06-13 DIAGNOSIS — H9012 Conductive hearing loss, unilateral, left ear, with unrestricted hearing on the contralateral side: Secondary | ICD-10-CM | POA: Diagnosis not present

## 2021-06-13 MED ORDER — CIPROFLOXACIN-DEXAMETHASONE 0.3-0.1 % OT SUSP
OTIC | 3 refills | Status: DC
  Filled 2021-06-13: qty 7.5, 7d supply, fill #0

## 2021-06-13 NOTE — Assessment & Plan Note (Signed)
Subacute-chronic Completed the Augmentin, prednisone and has been using bacterial eardrops Some improvement, but still symptomatic Did see ENT today and ear was cleaned out and prescribed Ciprodex, which she will take for 2 weeks Takes Zyrtec daily and start using Flonase 1-2 times daily

## 2021-06-21 ENCOUNTER — Encounter: Payer: Self-pay | Admitting: Internal Medicine

## 2021-06-22 NOTE — Progress Notes (Signed)
Subjective:    Patient ID: Emily Foley, female    DOB: 10/25/1955, 65 y.o.   MRN: [id number]  This visit occurred during the SARS-CoV-2 public health emergency.  Safety protocols were in place, including screening questions prior to the visit, additional usage of staff PPE, and extensive cleaning of exam room while observing appropriate contact time as indicated for disinfecting solutions.    HPI The patient is here for an acute visit.   ? UTI:  Her symptoms started 2 days ago.  She woke up with dysuria, pelvic pressure and increase in frequency.  She thought it could have been related to drinking soda and eating chocolate the night before.  She drank a lot of fluids and her symptoms did improve.  Her symptoms are better so she does not think she has urinary tract infection, but still has some mild discomfort.  She is more tired than usual.     Ear - pain , swelling gone.  No d/c since two nights ago.  Hearing is still decreased in the left ear and it feels clogged.  She is using Flonase daily.  She has taken Sudafed as needed.    Medications and allergies reviewed with patient and updated if appropriate.  Patient Active Problem List   Diagnosis Date Noted  . Eustachian tube dysfunction, left 06/23/2021  . Dysuria 06/23/2021  . Otitis externa of left ear 04/07/2021  . Preventive antibiotic-dental work 04/07/2021  . Acute sinus infection 03/20/2021  . Abdominal bloating 03/20/2021  . Hypertension 12/30/2020  . GERD (gastroesophageal reflux disease) 12/30/2020  . Hepatic steatosis 12/30/2020  . Osteoarthritis 12/30/2020  . 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 Counseling regarding advance care planning and goals of care 01/13/2018  . Iron deficiency anemia 04/29/2017  . Thrombocytopenia ([place]) 01/28/2017  . Splenomegaly 01/28/2017    Current Outpatient Medications on File Prior to Visit  Medication Sig Dispense Refill  . acyclovir ointment (ZOVIRAX)  5 % APPLY TO THE AFFECTED AREA(S) 5 TIMES A DAY AS NEEDED 15 g 5  . amoxicillin (AMOXIL) 500 MG capsule Take 4 capsules by mouth one hour prior to dental visit 4 capsule 0  . celecoxib (CELEBREX) 200 MG capsule TAKE 1 CAPSULE BY MOUTH ONCE DAILY WITH FOOD AS NEEDED 30 capsule 1  . ciprofloxacin-dexamethasone (CIPRODEX) OTIC suspension Place 4 drops into affected ear 2 times per day for 7 days 7.5 mL 3  . diclofenac Sodium (VOLTAREN) 1 % GEL APPLY 2 GRAMS TO UPPER EXTREMITY JOINT EVERY 6 HOURS AS NEEDED XX123456 g 1  . folic acid (FOLVITE) Q000111Q MCG tablet Take 800 mcg by mouth daily.     Marland Kitchen losartan (COZAAR) 50 MG tablet TAKE 1 TABLET BY MOUTH ONCE DAILY 90 tablet 3  . ondansetron (ZOFRAN ODT) 4 MG disintegrating tablet Dissolve 1 tablet (4 mg total) by mouth every 8 (eight) hours as needed for nausea or vomiting. 20 tablet 0  . pantoprazole (PROTONIX) 40 MG tablet TAKE 1 TABLET BY MOUTH ONCE A DAY 90 tablet 4  . triamcinolone (KENALOG) 0.1 % triamcinolone acetonide 0.1 % topical cream  APPLY A THIN LAYER TO THE AFFECTED AREA(S) BY TOPICAL ROUTE 2 TIMES PER DAY    . valACYclovir (VALTREX) 1000 MG tablet Take 1 tablet (1,000 mg total) by mouth 2 (two) times daily. 20 tablet 0  . vitamin B-12 (CYANOCOBALAMIN) 1000 MCG tablet Take 1,000 mcg by mouth daily.     No current facility-administered medications on file  prior to visit.    Past Medical History:  Diagnosis Date  . Arthritis   . Cancer ([place])   . Hypertension     No past surgical history on file.  Social History   Socioeconomic History  . Marital status: Married    Spouse name: Not on file  . Number of children: Not on file  . Years of education: Not on file  . Highest education level: Not on file  Occupational History  . Not on file  Tobacco Use  . Smoking status: Never  . Smokeless tobacco: Never  Vaping Use  . Vaping Use: Never used  Substance and Sexual Activity  . Alcohol use: Not Currently    Alcohol/week: 0.0 standard  drinks  . Drug use: Never  . Sexual activity: Not on file  Other Topics Concern  . Not on file  Social History Narrative  . Not on file   Social Determinants of Health   Financial Resource Strain: Not on file  Food Insecurity: Not on file  Transportation Needs: Not on file  Physical Activity: Not on file  Stress: Not on file  Social Connections: Not on file    Family History  Problem Relation Age of Onset  . Diabetes Mother   . Hyperlipidemia Mother   . Hypertension Mother   . Rheum arthritis Brother   . Diabetes Brother     Review of Systems     Objective:   Vitals:   06/23/21 1557  BP: 126/82  Pulse: 73  Temp: 98.3 F (36.8 C)  SpO2: 99%   BP Readings from Last 3 Encounters:  06/23/21 126/82  06/13/21 124/78  06/02/21 130/72   Wt Readings from Last 3 Encounters:  06/23/21 140 lb (63.5 kg)  06/13/21 142 lb (64.4 kg)  06/02/21 143 lb 3.2 oz (65 kg)   Body mass index is 25.61 kg/m.   Physical Exam Constitutional:      Appearance: Normal appearance.  HENT:     Left Ear: Ear canal and external ear normal. There is no impacted cerumen.     Ears:     Comments: Left tympanic membrane dull but without erythema Skin:    General: Skin is warm and dry.  Neurological:     Mental Status: She is alert.            Assessment & Plan:    See Problem List for Assessment and Plan of chronic medical problems.

## 2021-06-23 ENCOUNTER — Other Ambulatory Visit: Payer: Self-pay

## 2021-06-23 ENCOUNTER — Ambulatory Visit (INDEPENDENT_AMBULATORY_CARE_PROVIDER_SITE_OTHER): Payer: 59 | Admitting: Internal Medicine

## 2021-06-23 ENCOUNTER — Encounter: Payer: Self-pay | Admitting: Internal Medicine

## 2021-06-23 VITALS — BP 126/82 | HR 73 | Temp 98.3°F | Ht 62.0 in | Wt 140.0 lb

## 2021-06-23 DIAGNOSIS — R300 Dysuria: Secondary | ICD-10-CM | POA: Diagnosis not present

## 2021-06-23 DIAGNOSIS — H6982 Other specified disorders of Eustachian tube, left ear: Secondary | ICD-10-CM | POA: Diagnosis not present

## 2021-06-23 DIAGNOSIS — H6992 Unspecified Eustachian tube disorder, left ear: Secondary | ICD-10-CM | POA: Insufficient documentation

## 2021-06-23 LAB — POC URINALSYSI DIPSTICK (AUTOMATED)
Bilirubin, UA: NEGATIVE
Blood, UA: NEGATIVE
Glucose, UA: NEGATIVE
Ketones, UA: NEGATIVE
Leukocytes, UA: NEGATIVE
Nitrite, UA: NEGATIVE
Protein, UA: NEGATIVE
Spec Grav, UA: 1.01 (ref 1.010–1.025)
Urobilinogen, UA: 0.2 E.U./dL
pH, UA: 6 (ref 5.0–8.0)

## 2021-06-23 NOTE — Addendum Note (Signed)
Addended by: Marcina Millard on: 06/23/2021 04:53 PM   Modules accepted: Orders

## 2021-06-23 NOTE — Patient Instructions (Signed)
  Your urine looks clear.  Will send it for a culture.

## 2021-06-23 NOTE — Assessment & Plan Note (Signed)
Subacute Otitis externa looks like it has improved and no evidence of otitis media Experiencing decreased hearing related to eustachian tube dysfunction and possible middle ear effusion Sees ENT later this month Continue Flonase once-twice daily Start Sudafed daily Can do another steroid taper if needed

## 2021-06-23 NOTE — Assessment & Plan Note (Signed)
Acute Had some dysuria 2 nights ago along with bladder pressure and increasing frequency-symptoms improved with increased fluids Still slightly symptomatic, but does not think she has UTI, but wanted to make sure Urine dip here fairly negative Will send urine for culture to confirm no infection

## 2021-06-25 LAB — CULTURE, URINE COMPREHENSIVE: RESULT:: NO GROWTH

## 2021-06-29 ENCOUNTER — Ambulatory Visit: Payer: 59 | Attending: Internal Medicine

## 2021-06-29 DIAGNOSIS — Z23 Encounter for immunization: Secondary | ICD-10-CM

## 2021-06-29 NOTE — Progress Notes (Signed)
   Covid-19 Vaccination Clinic  Name:  Emily Foley    MRN: [id number] DOB: [birth year]  06/29/2021  [name] was observed post Covid-19 immunization for 15 minutes without incident. She was provided with Vaccine Information Sheet and instruction to access the V-Safe system.   Ms. Kitchen was instructed to call 911 with any severe reactions post vaccine: Difficulty breathing  Swelling of face and throat  A fast heartbeat  A bad rash all over body  Dizziness and weakness

## 2021-07-03 ENCOUNTER — Other Ambulatory Visit (HOSPITAL_COMMUNITY): Payer: Self-pay

## 2021-07-03 DIAGNOSIS — H6122 Impacted cerumen, left ear: Secondary | ICD-10-CM | POA: Diagnosis not present

## 2021-07-03 DIAGNOSIS — H9042 Sensorineural hearing loss, unilateral, left ear, with unrestricted hearing on the contralateral side: Secondary | ICD-10-CM | POA: Diagnosis not present

## 2021-07-03 MED ORDER — PREDNISONE 10 MG (21) PO TBPK
ORAL_TABLET | ORAL | 1 refills | Status: DC
  Filled 2021-07-03: qty 21, 6d supply, fill #0
  Filled 2021-07-06: qty 21, 6d supply, fill #1

## 2021-07-04 ENCOUNTER — Other Ambulatory Visit: Payer: Self-pay | Admitting: Otolaryngology

## 2021-07-04 ENCOUNTER — Encounter: Payer: Self-pay | Admitting: Internal Medicine

## 2021-07-04 ENCOUNTER — Other Ambulatory Visit (HOSPITAL_COMMUNITY): Payer: Self-pay | Admitting: Otolaryngology

## 2021-07-04 DIAGNOSIS — H9192 Unspecified hearing loss, left ear: Secondary | ICD-10-CM

## 2021-07-04 DIAGNOSIS — Z1231 Encounter for screening mammogram for malignant neoplasm of breast: Secondary | ICD-10-CM | POA: Diagnosis not present

## 2021-07-04 DIAGNOSIS — H918X9 Other specified hearing loss, unspecified ear: Secondary | ICD-10-CM

## 2021-07-04 LAB — HM MAMMOGRAPHY

## 2021-07-05 ENCOUNTER — Other Ambulatory Visit (HOSPITAL_BASED_OUTPATIENT_CLINIC_OR_DEPARTMENT_OTHER): Payer: Self-pay

## 2021-07-05 MED ORDER — COVID-19MRNA BIVAL VACC PFIZER 30 MCG/0.3ML IM SUSP
INTRAMUSCULAR | 0 refills | Status: DC
  Filled 2021-07-05: qty 0.3, 1d supply, fill #0

## 2021-07-06 ENCOUNTER — Other Ambulatory Visit (HOSPITAL_COMMUNITY): Payer: Self-pay

## 2021-07-07 ENCOUNTER — Other Ambulatory Visit (HOSPITAL_COMMUNITY): Payer: Self-pay

## 2021-07-10 ENCOUNTER — Ambulatory Visit (HOSPITAL_COMMUNITY)
Admission: RE | Admit: 2021-07-10 | Discharge: 2021-07-10 | Disposition: A | Discharge status: Home / Self Care | Payer: 59 | Source: Ambulatory Visit | Attending: Otolaryngology | Admitting: Otolaryngology

## 2021-07-10 DIAGNOSIS — H9190 Unspecified hearing loss, unspecified ear: Secondary | ICD-10-CM | POA: Diagnosis not present

## 2021-07-10 DIAGNOSIS — H918X9 Other specified hearing loss, unspecified ear: Secondary | ICD-10-CM | POA: Diagnosis not present

## 2021-07-10 MED ORDER — GADOBUTROL 1 MMOL/ML IV SOLN
7.0000 mL | Freq: Once | INTRAVENOUS | Status: AC | PRN
  Administered 2021-07-10: 7 mL via INTRAVENOUS

## 2021-07-27 ENCOUNTER — Encounter: Payer: Self-pay | Admitting: Internal Medicine

## 2021-07-27 NOTE — Progress Notes (Signed)
Outside notes received. Information abstracted. Notes sent to scan.  

## 2021-08-14 ENCOUNTER — Other Ambulatory Visit (HOSPITAL_COMMUNITY): Payer: Self-pay

## 2021-08-14 ENCOUNTER — Other Ambulatory Visit: Payer: Self-pay

## 2021-08-14 ENCOUNTER — Encounter: Payer: Self-pay | Admitting: Internal Medicine

## 2021-08-14 MED ORDER — CELECOXIB 200 MG PO CAPS
ORAL_CAPSULE | ORAL | 1 refills | Status: DC
  Filled 2021-08-14: qty 30, 30d supply, fill #0
  Filled 2022-07-09: qty 30, 30d supply, fill #1

## 2021-08-14 MED FILL — Acyclovir Oint 5%: CUTANEOUS | 7 days supply | Qty: 15 | Fill #0 | Status: CN

## 2021-08-14 MED FILL — Acyclovir Oint 5%: CUTANEOUS | 30 days supply | Qty: 15 | Fill #0 | Status: AC

## 2021-08-14 MED FILL — Pantoprazole Sodium EC Tab 40 MG (Base Equiv): ORAL | 90 days supply | Qty: 90 | Fill #0 | Status: AC

## 2021-08-15 ENCOUNTER — Other Ambulatory Visit (HOSPITAL_COMMUNITY): Payer: Self-pay

## 2021-08-17 ENCOUNTER — Encounter: Payer: Self-pay | Admitting: Internal Medicine

## 2021-08-17 ENCOUNTER — Other Ambulatory Visit (HOSPITAL_COMMUNITY): Payer: Self-pay

## 2021-08-17 ENCOUNTER — Other Ambulatory Visit: Payer: Self-pay

## 2021-08-17 MED ORDER — LOSARTAN POTASSIUM 50 MG PO TABS
ORAL_TABLET | Freq: Every day | ORAL | 3 refills | Status: DC
  Filled 2021-08-17: qty 90, 90d supply, fill #0
  Filled 2021-11-20: qty 90, 90d supply, fill #1
  Filled 2022-02-28: qty 90, 90d supply, fill #2
  Filled 2022-05-31: qty 90, 90d supply, fill #3

## 2021-08-23 DIAGNOSIS — H9042 Sensorineural hearing loss, unilateral, left ear, with unrestricted hearing on the contralateral side: Secondary | ICD-10-CM | POA: Diagnosis not present

## 2021-11-08 ENCOUNTER — Other Ambulatory Visit: Payer: Self-pay

## 2021-11-08 ENCOUNTER — Inpatient Hospital Stay: Payer: 59 | Attending: Hematology | Admitting: Hematology

## 2021-11-08 ENCOUNTER — Inpatient Hospital Stay: Payer: 59

## 2021-11-08 VITALS — BP 147/72 | HR 81 | Temp 97.7°F | Resp 20 | Wt 151.1 lb

## 2021-11-08 DIAGNOSIS — D649 Anemia, unspecified: Secondary | ICD-10-CM | POA: Diagnosis not present

## 2021-11-08 DIAGNOSIS — C8307 Small cell B-cell lymphoma, spleen: Secondary | ICD-10-CM | POA: Diagnosis not present

## 2021-11-08 DIAGNOSIS — Z8572 Personal history of non-Hodgkin lymphomas: Secondary | ICD-10-CM | POA: Diagnosis not present

## 2021-11-08 LAB — CMP (CANCER CENTER ONLY)
ALT: 49 U/L — ABNORMAL HIGH (ref 0–44)
AST: 28 U/L (ref 15–41)
Albumin: 4.4 g/dL (ref 3.5–5.0)
Alkaline Phosphatase: 78 U/L (ref 38–126)
Anion gap: 8 (ref 5–15)
BUN: 20 mg/dL (ref 8–23)
CO2: 23 mmol/L (ref 22–32)
Calcium: 9.6 mg/dL (ref 8.9–10.3)
Chloride: 107 mmol/L (ref 98–111)
Creatinine: 0.58 mg/dL (ref 0.44–1.00)
GFR, Estimated: >60 mL/min (ref >60)
Glucose, Bld: 111 mg/dL — ABNORMAL HIGH (ref 70–99)
Potassium: 3.6 mmol/L (ref 3.5–5.1)
Sodium: 138 mmol/L (ref 135–145)
Total Bilirubin: 0.6 mg/dL (ref 0.3–1.2)
Total Protein: 6.7 g/dL (ref 6.5–8.1)

## 2021-11-08 LAB — CBC WITH DIFFERENTIAL/PLATELET
Abs Immature Granulocytes: 0.01 10*3/uL (ref 0.00–0.07)
Basophils Absolute: 0 10*3/uL (ref 0.0–0.1)
Basophils Relative: 1 %
Eosinophils Absolute: 0.1 10*3/uL (ref 0.0–0.5)
Eosinophils Relative: 3 %
HCT: 42 % (ref 36.0–46.0)
Hemoglobin: 14.5 g/dL (ref 12.0–15.0)
Immature Granulocytes: 0 %
Lymphocytes Relative: 24 %
Lymphs Abs: 1.3 10*3/uL (ref 0.7–4.0)
MCH: 29.8 pg (ref 26.0–34.0)
MCHC: 34.5 g/dL (ref 30.0–36.0)
MCV: 86.2 fL (ref 80.0–100.0)
Monocytes Absolute: 0.5 10*3/uL (ref 0.1–1.0)
Monocytes Relative: 9 %
Neutro Abs: 3.5 10*3/uL (ref 1.7–7.7)
Neutrophils Relative %: 63 %
Platelets: 237 10*3/uL (ref 150–400)
RBC: 4.87 MIL/uL (ref 3.87–5.11)
RDW: 12.6 % (ref 11.5–15.5)
WBC: 5.5 10*3/uL (ref 4.0–10.5)
nRBC: 0 % (ref 0.0–0.2)

## 2021-11-08 LAB — LACTATE DEHYDROGENASE: LDH: 204 U/L — ABNORMAL HIGH (ref 98–192)

## 2021-11-09 ENCOUNTER — Telehealth: Payer: Self-pay | Admitting: Hematology

## 2021-11-09 NOTE — Telephone Encounter (Signed)
Left message with follow-up appointment per 2/1 los.

## 2021-11-14 ENCOUNTER — Encounter: Payer: Self-pay | Admitting: Hematology

## 2021-11-14 NOTE — Progress Notes (Incomplete Revision)
HEMATOLOGY/ONCOLOGY CLINIC NOTE  Date of Service: .11/08/2021  Patient Care Team: Binnie Rail, MD as PCP - General (Internal Medicine)  CHIEF COMPLAINTS/PURPOSE OF CONSULTATION:  Follow-up for continued evaluation and management of splenic marginal zone lymphoma  HISTORY OF PRESENTING ILLNESS:    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 The patient's last visit with [place] was on 01/06/2021. The pt reports that she is doing well overall.  The pt reports no acute new concerns. Has been feeling well. No fevers/chills/night sweats , unexpected weight loss. Good energy levels.  Lab results today 05/08/2021 of CBC w/diff and CMP reviewed with patient  On review of systems, pt reports no other acute new symptoms.   MEDICAL HISTORY:  Past Medical History:  Diagnosis Date   Arthritis    Cancer ([place])    Hypertension     SURGICAL HISTORY: No past surgical history on file.  SOCIAL HISTORY: Social History   Socioeconomic History   Marital status: Married    Spouse name: Not on file   Number of children: Not on file   Years of education: Not on file   Highest education level: Not on file  Occupational History   Not on file  Tobacco Use   Smoking status: Never   Smokeless tobacco: Never  Vaping Use   Vaping Use: Never used  Substance and Sexual Activity   Alcohol use: Not Currently    Alcohol/week: 0.0 standard drinks   Drug use: Never   Sexual activity: Not on file  Other Topics Concern   Not on file  Social History Narrative   Not on file   Social Determinants of Health   Financial Resource Strain: Not on file  Food Insecurity: Not on file  Transportation Needs: Not on file  Physical Activity: Not on file  Stress: Not on file  Social Connections: Not on file  Intimate Partner Violence: Not on file    FAMILY HISTORY: Family History  Problem Relation Age of Onset   Diabetes Mother     Hyperlipidemia Mother    Hypertension Mother    Rheum arthritis Brother    Diabetes Brother     ALLERGIES:  is allergic to biaxin [clarithromycin] and doxycycline.  MEDICATIONS:  Current Outpatient Medications  Medication Sig Dispense Refill   celecoxib (CELEBREX) 200 MG capsule TAKE 1 CAPSULE BY MOUTH ONCE DAILY WITH FOOD AS NEEDED 30 capsule 1   ciprofloxacin-dexamethasone (CIPRODEX) OTIC suspension Place 4 drops into affected ear 2 times per day for 7 days 7.5 mL 3   folic acid (FOLVITE) 427 MCG tablet Take 800 mcg by mouth daily.      losartan (COZAAR) 50 MG tablet TAKE 1 TABLET BY MOUTH ONCE DAILY 90 tablet 3   ondansetron (ZOFRAN ODT) 4 MG disintegrating tablet Dissolve 1 tablet (4 mg total) by mouth every 8 (eight) hours as needed for nausea or vomiting. 20 tablet 0   pantoprazole (PROTONIX) 40 MG tablet TAKE 1 TABLET BY MOUTH ONCE A DAY 90 tablet 4   valACYclovir (VALTREX) 1000 MG tablet Take 1 tablet (1,000 mg total) by mouth 2 (two) times daily. 20 tablet 0   vitamin B-12 (CYANOCOBALAMIN) 1000 MCG tablet Take 1,000 mcg by mouth daily.     amoxicillin (AMOXIL) 500 MG capsule Take 4 capsules by mouth one hour prior to dental visit 4 capsule 0   COVID-19 mRNA bivalent vaccine, Pfizer, injection Inject into the muscle. 0.3 mL 0  predniSONE (STERAPRED UNI-PAK 21 TAB) 10 MG (21) TBPK tablet Take as directed per package instructions. (Patient not taking: Reported on 11/08/2021) 21 tablet 1   triamcinolone (KENALOG) 0.1 % triamcinolone acetonide 0.1 % topical cream  APPLY A THIN LAYER TO THE AFFECTED AREA(S) BY TOPICAL ROUTE 2 TIMES PER DAY     No current facility-administered medications for this visit.    REVIEW OF SYSTEMS:   10 Point review of Systems was done is negative except as noted above.  PHYSICAL EXAMINATION: ECOG FS:1 - Symptomatic but completely ambulatory  Vitals:   11/08/21 1510  BP: (!) 147/72  Pulse: 81  Resp: 20  Temp: 97.7 F (36.5 C)  SpO2: 100%    Wt  Readings from Last 3 Encounters:  11/08/21 151 lb 1.6 oz (68.5 kg)  06/23/21 140 lb (63.5 kg)  06/13/21 142 lb (64.4 kg)   Body mass index is 27.64 kg/m.    NAD GENERAL:alert, in no acute distress and comfortable SKIN: no acute rashes, no significant lesions EYES: conjunctiva are pink and non-injected, sclera anicteric OROPHARYNX: MMM, no exudates, no oropharyngeal erythema or ulceration NECK: supple, no JVD LYMPH:  no palpable lymphadenopathy in the cervical, axillary or inguinal regions LUNGS: clear to auscultation b/l with normal respiratory effort HEART: regular rate & rhythm ABDOMEN:  normoactive bowel sounds , non tender, not distended. Extremity: no pedal edema PSYCH: alert & oriented x 3 with fluent speech NEURO: no focal motor/sensory deficits  LABORATORY DATA:  I have reviewed the data as listed  . CBC Latest Ref Rng & Units 11/08/2021 05/08/2021 01/06/2021  WBC 4.0 - 10.5 K/uL 5.5 6.2 5.8  Hemoglobin 12.0 - 15.0 g/dL 14.5 14.7 14.6  Hematocrit 36.0 - 46.0 % 42.0 42.5 42.6  Platelets 150 - 400 K/uL 237 247 221   . CBC    Component Value Date/Time   WBC 5.5 11/08/2021 1430   RBC 4.87 11/08/2021 1430   HGB 14.5 11/08/2021 1430   HGB 14.5 07/03/2018 0835   HGB 12.8 09/10/2017 0801   HCT 42.0 11/08/2021 1430   HCT 39.4 09/10/2017 0801   PLT 237 11/08/2021 1430   PLT 183 07/03/2018 0835   PLT 144 (L) 09/10/2017 0801   MCV 86.2 11/08/2021 1430   MCV 79.0 (L) 09/10/2017 0801   MCH 29.8 11/08/2021 1430   MCHC 34.5 11/08/2021 1430   RDW 12.6 11/08/2021 1430   RDW 15.6 (H) 09/10/2017 0801   LYMPHSABS 1.3 11/08/2021 1430   LYMPHSABS 0.9 09/10/2017 0801   MONOABS 0.5 11/08/2021 1430   MONOABS 0.4 09/10/2017 0801   EOSABS 0.1 11/08/2021 1430   EOSABS 0.1 09/10/2017 0801   BASOSABS 0.0 11/08/2021 1430   BASOSABS 0.0 09/10/2017 0801    . CMP Latest Ref Rng & Units 11/08/2021 05/08/2021 01/06/2021  Glucose 70 - 99 mg/dL 111(H) 96 109(H)  BUN 8 - 23 mg/dL 20 17 16    Creatinine 0.44 - 1.00 mg/dL 0.58 0.76 0.76  Sodium 135 - 145 mmol/L 138 141 140  Potassium 3.5 - 5.1 mmol/L 3.6 4.1 4.1  Chloride 98 - 111 mmol/L 107 109 108  CO2 22 - 32 mmol/L 23 24 22   Calcium 8.9 - 10.3 mg/dL 9.6 9.6 8.5(L)  Total Protein 6.5 - 8.1 g/dL 6.7 6.6 6.3(L)  Total Bilirubin 0.3 - 1.2 mg/dL 0.6 0.7 0.9  Alkaline Phos 38 - 126 U/L 78 91 72  AST 15 - 41 U/L 28 25 25   ALT 0 - 44 U/L 49(H) 47(H) 50(H)   .  Lab Results  Component Value Date   LDH 204 (H) 11/08/2021   . Lab Results  Component Value Date   LDH 204 (H) 11/08/2021     12/27/17 BM Bx:   12/27/17 Flow Cytometry:     RADIOGRAPHIC STUDIES: I have personally reviewed the radiological images as listed and agreed with the findings in the report. No results found.  ASSESSMENT & PLAN:   66 y.o. female with  1. 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 Patient appeared to be have significant constitutional symptoms . No evidence of rheumatological or infectious etiology for her fevers and night sweats. Constitutional symptoms now resolved.  2. S/p Splenomegaly - likely due to [place]. Marland Kitchenimproved on [place] abd. Clinical no palpable splenomegaly today.   04/18/18 US Abdomen revealed Splenomegaly with a length of 13.3 cm and a volume of 432 cc. By report, the spleen measured up to 15.1 cm in maximum dimension on the PET-CT from January 08, 2018 suggesting interval improvement.   3. S/p Anemia - with elevated LDH and low haptoglobin concerning for hemolysis. Coombs neg . Could be IgA driven Coombs neg AIHA. +ve reticulocytosis and Smear with some microspherocytes. No over urine hemosiderinuria. Partly anemia could also be from hypersplenism.  hgb normal now at 14.7  4. Low C4 ? Immune complex disease/autoimmunity related to lymphoma. Per rheumatology - no evidence of primary rheumatological disorder. No overt evidence of endocarditis.  5. h/o Fevers/chills/night sweat -- likely constitutional  symptoms related to ?lymphoma - now resolved. 6. Thrombocytopenia likely from splenomegaly vs lymphoma-- resolved. PLT resolved   PLAN:  -Discussed pt labwork today, 05/08/2021; cbc wnl, CMP unremarkable. -LDH chronically borderline elevated - no acute changes. -No lab or clinical evidence of SMZL progression at this time. Will continue watchful observation. -continue 2000 IU Vitamin D daily. -Continue B-complex vitamin. -Will see back in 45months  FOLLOW UP: RTC with Dr Irene Limbo with labs in 6 months   The total time spent in the appointment was 20 minutes and more than 50% was on counseling and direct patient cares.   All of the patient's questions were answered with apparent satisfaction. The patient knows to call the clinic with any problems, questions or concerns.    Sullivan Lone MD MS AAHIVMS [facility] Hematology/Oncology Physician [facility]         .Marland Kitchen

## 2021-11-14 NOTE — Progress Notes (Addendum)
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 is here for continued evaluation and management of her splenic marginal zone lymphoma. She was last seen by [place] about 6 months ago. She notes she has been doing well overall and has no acute new concerns. Has gained a little bit of weight over the holidays but has been staying physically active. No fevers no chills no night sweats no unexpected new fatigue. No abdominal pain or distention. No new acute joint pain or swelling.  Labs done today were reviewed with the patient.  MEDICAL HISTORY:  Past Medical History:  Diagnosis Date   Arthritis    Cancer ([place])    Hypertension     SURGICAL HISTORY: No past surgical history on file.  SOCIAL HISTORY: Social History   Socioeconomic History   Marital status: Married    Spouse name: Not on file   Number of children: Not on file   Years of education: Not on file   Highest education level: Not on file  Occupational History   Not on file  Tobacco Use   Smoking status: Never   Smokeless tobacco: Never  Vaping Use   Vaping Use: Never used  Substance and Sexual Activity   Alcohol use: Not Currently    Alcohol/week: 0.0 standard drinks   Drug use: Never   Sexual activity: Not on file  Other Topics Concern   Not on file  Social History Narrative   Not on file   Social Determinants of Health   Financial Resource Strain: Not on file  Food Insecurity: Not on file  Transportation Needs: Not on file  Physical Activity: Not on file  Stress: Not on file  Social Connections: Not on file  Intimate Partner Violence: Not on file     FAMILY HISTORY: Family History  Problem Relation Age of Onset   Diabetes Mother    Hyperlipidemia Mother    Hypertension Mother    Rheum arthritis Brother    Diabetes Brother     ALLERGIES:  is allergic to biaxin [clarithromycin] and doxycycline.  MEDICATIONS:  Current Outpatient Medications  Medication Sig Dispense Refill   celecoxib (CELEBREX) 200 MG capsule TAKE 1 CAPSULE BY MOUTH ONCE DAILY WITH FOOD AS NEEDED 30 capsule 1   ciprofloxacin-dexamethasone (CIPRODEX) OTIC suspension Place 4 drops into affected ear 2 times per day for 7 days 7.5 mL 3   folic acid (FOLVITE) 415 MCG tablet Take 800 mcg by mouth daily.      losartan (COZAAR) 50 MG tablet TAKE 1 TABLET BY MOUTH ONCE DAILY 90 tablet 3   ondansetron (ZOFRAN ODT) 4 MG disintegrating tablet Dissolve 1 tablet (4 mg total) by mouth every 8 (eight) hours as needed for nausea or vomiting. 20 tablet 0   pantoprazole (PROTONIX) 40 MG tablet TAKE 1 TABLET BY MOUTH ONCE A DAY 90 tablet 4   valACYclovir (VALTREX) 1000 MG tablet Take 1 tablet (1,000 mg total) by mouth 2 (two) times daily. 20 tablet 0   vitamin B-12 (CYANOCOBALAMIN) 1000 MCG tablet Take 1,000 mcg by mouth daily.     amoxicillin (AMOXIL) 500 MG capsule Take 4 capsules by mouth one hour prior to dental visit  4 capsule 0   COVID-19 mRNA bivalent vaccine, Pfizer, injection Inject into the muscle. 0.3 mL 0   predniSONE (STERAPRED UNI-PAK 21 TAB) 10 MG (21) TBPK tablet Take as directed per package instructions. (Patient not taking: Reported on 11/08/2021) 21 tablet 1   triamcinolone (KENALOG) 0.1 % triamcinolone acetonide 0.1 % topical cream  APPLY A THIN LAYER TO THE AFFECTED AREA(S) BY TOPICAL ROUTE 2 TIMES PER DAY     No current facility-administered medications for this visit.    REVIEW OF SYSTEMS:   .10 Point review of Systems was done is negative except as noted above.  PHYSICAL EXAMINATION: ECOG FS:1 - Symptomatic but completely ambulatory  Vitals:   11/08/21  1510  BP: (!) 147/72  Pulse: 81  Resp: 20  Temp: 97.7 F (36.5 C)  SpO2: 100%    Wt Readings from Last 3 Encounters:  11/08/21 151 lb 1.6 oz (68.5 kg)  06/23/21 140 lb (63.5 kg)  06/13/21 142 lb (64.4 kg)   Body mass index is 27.64 kg/m.    NAD GENERAL:alert, in no acute distress and comfortable SKIN: no acute rashes, no significant lesions EYES: conjunctiva are pink and non-injected, sclera anicteric OROPHARYNX: MMM, no exudates, no oropharyngeal erythema or ulceration NECK: supple, no JVD LYMPH:  no palpable lymphadenopathy in the cervical, axillary or inguinal regions LUNGS: clear to auscultation b/l with normal respiratory effort HEART: regular rate & rhythm ABDOMEN:  normoactive bowel sounds , non tender, not distended. Extremity: no pedal edema PSYCH: alert & oriented x 3 with fluent speech NEURO: no focal motor/sensory deficits   LABORATORY DATA:  I have reviewed the data as listed  . CBC Latest Ref Rng & Units 11/08/2021 05/08/2021 01/06/2021  WBC 4.0 - 10.5 K/uL 5.5 6.2 5.8  Hemoglobin 12.0 - 15.0 g/dL 14.5 14.7 14.6  Hematocrit 36.0 - 46.0 % 42.0 42.5 42.6  Platelets 150 - 400 K/uL 237 247 221   CBC    Component Value Date/Time   WBC 5.5 11/08/2021 1430   RBC 4.87 11/08/2021 1430   HGB 14.5 11/08/2021 1430   HGB 14.5 07/03/2018 0835   HGB 12.8 09/10/2017 0801   HCT 42.0 11/08/2021 1430   HCT 39.4 09/10/2017 0801   PLT 237 11/08/2021 1430   PLT 183 07/03/2018 0835   PLT 144 (L) 09/10/2017 0801   MCV 86.2 11/08/2021 1430   MCV 79.0 (L) 09/10/2017 0801   MCH 29.8 11/08/2021 1430   MCHC 34.5 11/08/2021 1430   RDW 12.6 11/08/2021 1430   RDW 15.6 (H) 09/10/2017 0801   LYMPHSABS 1.3 11/08/2021 1430   LYMPHSABS 0.9 09/10/2017 0801   MONOABS 0.5 11/08/2021 1430   MONOABS 0.4 09/10/2017 0801   EOSABS 0.1 11/08/2021 1430   EOSABS 0.1 09/10/2017 0801   BASOSABS 0.0 11/08/2021 1430   BASOSABS 0.0 09/10/2017 0801    . CMP Latest Ref Rng & Units 11/08/2021  05/08/2021 01/06/2021  Glucose 70 - 99 mg/dL 111(H) 96 109(H)  BUN 8 - 23 mg/dL 20 17 16   Creatinine 0.44 - 1.00 mg/dL 0.58 0.76 0.76  Sodium 135 - 145 mmol/L 138 141 140  Potassium 3.5 - 5.1 mmol/L 3.6 4.1 4.1  Chloride 98 - 111 mmol/L 107 109 108  CO2 22 - 32 mmol/L 23 24 22   Calcium 8.9 - 10.3 mg/dL 9.6 9.6 8.5(L)  Total Protein 6.5 - 8.1 g/dL 6.7 6.6 6.3(L)  Total Bilirubin 0.3 - 1.2 mg/dL 0.6 0.7 0.9  Alkaline Phos 38 - 126 U/L 78  91 72  AST 15 - 41 U/L 28 25 25   ALT 0 - 44 U/L 49(H) 47(H) 50(H)   . Lab Results  Component Value Date   LDH 204 (H) 11/08/2021   12/27/17 BM Bx:   12/27/17 Flow Cytometry:     RADIOGRAPHIC STUDIES: I have personally reviewed the radiological images as listed and agreed with the findings in the report. No results found.  ASSESSMENT & PLAN:   66 y.o. female with  1. 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 Patient appeared to be have significant constitutional symptoms . No evidence of rheumatological or infectious etiology for her fevers and night sweats. Constitutional symptoms now resolved.  2. S/p Splenomegaly - likely due to [place]. Marland Kitchenimproved on [place] abd. Clinical no palpable splenomegaly today.  04/18/18 US Abdomen revealed Splenomegaly with a length of 13.3 cm and a volume of 432 cc. By report, the spleen measured up to 15.1 cm in maximum dimension on the PET-CT from January 08, 2018 suggesting interval improvement.   3. S/p Anemia - with elevated LDH and low haptoglobin concerning for hemolysis. Coombs neg . Could be IgA driven Coombs neg AIHA. +ve reticulocytosis and Smear with some microspherocytes. No over urine hemosiderinuria. Partly anemia could also be from hypersplenism.    4.  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 Per rheumatology - no evidence of primary rheumatological disorder. No overt evidence of endocarditis.  5. h/o Fevers/chills/night sweat -- likely  constitutional symptoms related to ?lymphoma - now resolved. 6. Thrombocytopenia likely from splenomegaly vs lymphoma-- resolved. PLT resolved  PLAN:  -Labs done today reviewed with the patient CBC is within normal limits with a hemoglobin of 14.5, WBC count of 5.5k platelets of 237k CMP within normal limits LDH high normal at 204 stable/improved -Patient with no lab or clinical evidence of splenic marginal zone lymphoma progression at this time. -We shall continue watchful observation -Patient she will continue her vitamin D 2000 units daily and vitamin B complex 1 capsule p.o. daily. -Discussed overall wellness interventions including healthy diet exercise and stress reduction. -We shall see her back in 6 months unless any new questions or concerns arise  FOLLOW UP: RTC with Dr Irene Limbo with labs in 6 months  All of the patient's questions were answered with apparent satisfaction. The patient knows to call the clinic with any problems, questions or concerns.    Sullivan Lone MD MS AAHIVMS [facility] Hematology/Oncology Physician [facility]         .Marland Kitchen

## 2021-11-20 ENCOUNTER — Other Ambulatory Visit (HOSPITAL_COMMUNITY): Payer: Self-pay

## 2021-11-22 DIAGNOSIS — R194 Change in bowel habit: Secondary | ICD-10-CM | POA: Diagnosis not present

## 2021-11-22 DIAGNOSIS — R140 Abdominal distension (gaseous): Secondary | ICD-10-CM | POA: Diagnosis not present

## 2021-11-22 DIAGNOSIS — K219 Gastro-esophageal reflux disease without esophagitis: Secondary | ICD-10-CM | POA: Diagnosis not present

## 2021-11-22 DIAGNOSIS — R1013 Epigastric pain: Secondary | ICD-10-CM | POA: Diagnosis not present

## 2021-12-15 DIAGNOSIS — Z8669 Personal history of other diseases of the nervous system and sense organs: Secondary | ICD-10-CM | POA: Diagnosis not present

## 2021-12-15 DIAGNOSIS — Z881 Allergy status to other antibiotic agents status: Secondary | ICD-10-CM | POA: Diagnosis not present

## 2021-12-15 DIAGNOSIS — H9193 Unspecified hearing loss, bilateral: Secondary | ICD-10-CM | POA: Diagnosis not present

## 2021-12-21 ENCOUNTER — Other Ambulatory Visit (HOSPITAL_COMMUNITY): Payer: Self-pay

## 2021-12-21 DIAGNOSIS — B9681 Helicobacter pylori [H. pylori] as the cause of diseases classified elsewhere: Secondary | ICD-10-CM | POA: Diagnosis not present

## 2021-12-21 DIAGNOSIS — R140 Abdominal distension (gaseous): Secondary | ICD-10-CM | POA: Diagnosis not present

## 2021-12-21 DIAGNOSIS — K219 Gastro-esophageal reflux disease without esophagitis: Secondary | ICD-10-CM | POA: Diagnosis not present

## 2021-12-21 DIAGNOSIS — R110 Nausea: Secondary | ICD-10-CM | POA: Diagnosis not present

## 2021-12-21 MED ORDER — DOXYCYCLINE HYCLATE 100 MG PO TABS
ORAL_TABLET | ORAL | 0 refills | Status: DC
  Filled 2021-12-21: qty 28, 14d supply, fill #0

## 2021-12-21 MED ORDER — METRONIDAZOLE 500 MG PO TABS
ORAL_TABLET | ORAL | 0 refills | Status: DC
  Filled 2021-12-21: qty 28, 14d supply, fill #0

## 2021-12-21 MED ORDER — BISMUTH SUBSALICYLATE 262 MG PO CHEW: CHEWABLE_TABLET | ORAL | 0 refills | Status: DC

## 2021-12-28 ENCOUNTER — Other Ambulatory Visit (HOSPITAL_COMMUNITY): Payer: Self-pay

## 2021-12-29 ENCOUNTER — Other Ambulatory Visit (HOSPITAL_COMMUNITY): Payer: Self-pay

## 2022-01-03 DIAGNOSIS — H524 Presbyopia: Secondary | ICD-10-CM | POA: Diagnosis not present

## 2022-01-24 ENCOUNTER — Encounter: Payer: Self-pay | Admitting: Internal Medicine

## 2022-01-24 ENCOUNTER — Encounter: Payer: Self-pay | Admitting: Family Medicine

## 2022-01-24 ENCOUNTER — Ambulatory Visit: Payer: 59 | Admitting: Family Medicine

## 2022-01-24 VITALS — BP 142/88 | HR 95 | Temp 98.0°F | Ht 62.0 in | Wt 145.0 lb

## 2022-01-24 DIAGNOSIS — R5383 Other fatigue: Secondary | ICD-10-CM

## 2022-01-24 DIAGNOSIS — R519 Headache, unspecified: Secondary | ICD-10-CM | POA: Diagnosis not present

## 2022-01-24 DIAGNOSIS — J029 Acute pharyngitis, unspecified: Secondary | ICD-10-CM | POA: Diagnosis not present

## 2022-01-24 DIAGNOSIS — R051 Acute cough: Secondary | ICD-10-CM | POA: Diagnosis not present

## 2022-01-24 LAB — POCT RAPID STREP A (OFFICE): Rapid Strep A Screen: NEGATIVE

## 2022-01-24 LAB — POC COVID19 BINAXNOW: SARS Coronavirus 2 Ag: NEGATIVE

## 2022-01-24 NOTE — Progress Notes (Signed)
Subjective: ? Emily Foley is a 66 y.o. female who presents for evaluation of sore throat.  She has had a recent close exposure to someone with proven streptococcal pharyngitis.  Associated symptoms include. ?Fatigue, headache, and cough started this morning.  ? ? ?Denies fever, chills, body aches, chest pain, palpitations, shortness of breath, abdominal pain, N/V/D ? ? ? ? ?Treatment to date:  Zyrtec .  Positive sick contacts.  No other aggravating or relieving factors.  No other c/o. ? ?The following portions of the patient's history were reviewed and updated as appropriate: allergies, current medications, past medical history, past social history, past surgical history and problem list. ? ?ROS as in subjective ?  ?Objective: ?Vitals:  ? 01/24/22 1129  ?BP: (!) 142/88  ?Pulse: 95  ?Temp: 98 ?F (36.7 ?C)  ?SpO2: 98%  ? ? ?General appearance: no distress, WD/WN, mildly ill-appearing ?HEENT: normocephalic, conjunctiva/corneas normal, sclerae anicteric, nares patent, no discharge or erythema, pharynx with erythema, without exudate.  ?Oral cavity: MMM, no lesions  ?Neck: supple, no lymphadenopathy, no thyromegaly ?Heart: RRR, normal S1, S2, no murmurs ?Lungs: CTA bilaterally, no wheezes, rhonchi, or rales ? ? ?Laboratory ?Strep test done. Results:negative.  ?Covid test done and negative.  ?  ?Assessment and Plan: ?Acute pharyngitis, unspecified etiology - Plan: POCT rapid strep A ? ?Acute nonintractable headache, unspecified headache type - Plan: POC COVID-19 ? ?Fatigue, unspecified type - Plan: POC COVID-19 ? ?Acute cough - Plan: POCT rapid strep A, POC COVID-19 ? ?Advised that symptoms and exam suggest a viral etiology.  Discussed symptomatic treatment including salt water gargles, warm fluids, rest, hydrate well, can use over-the-counter Tylenol or ibuprofen for throat pain, fever, or malaise. If worse or not improving in the next week, call or return.  ? ?

## 2022-01-24 NOTE — Patient Instructions (Signed)
You appear to have a viral illness.  You were negative for strep and COVID today. ? ?I recommend treating your symptoms and following up if you are getting significantly worse or not improving in the next week. ?

## 2022-01-25 ENCOUNTER — Ambulatory Visit: Payer: 59 | Admitting: Internal Medicine

## 2022-02-28 ENCOUNTER — Other Ambulatory Visit (HOSPITAL_COMMUNITY): Payer: Self-pay

## 2022-03-14 ENCOUNTER — Other Ambulatory Visit: Payer: Self-pay | Admitting: Oncology

## 2022-03-30 ENCOUNTER — Encounter: Payer: Self-pay | Admitting: Internal Medicine

## 2022-05-04 ENCOUNTER — Other Ambulatory Visit: Payer: Self-pay | Admitting: *Deleted

## 2022-05-04 DIAGNOSIS — C8307 Small cell B-cell lymphoma, spleen: Secondary | ICD-10-CM

## 2022-05-08 ENCOUNTER — Other Ambulatory Visit: Payer: Self-pay

## 2022-05-08 ENCOUNTER — Inpatient Hospital Stay: Payer: 59 | Attending: Hematology | Admitting: Hematology

## 2022-05-08 ENCOUNTER — Inpatient Hospital Stay: Payer: 59

## 2022-05-08 VITALS — BP 152/64 | HR 65 | Temp 97.7°F | Resp 15 | Ht 62.0 in | Wt 142.3 lb

## 2022-05-08 DIAGNOSIS — C8307 Small cell B-cell lymphoma, spleen: Secondary | ICD-10-CM

## 2022-05-08 DIAGNOSIS — D649 Anemia, unspecified: Secondary | ICD-10-CM | POA: Diagnosis not present

## 2022-05-08 LAB — CMP (CANCER CENTER ONLY)
ALT: 41 U/L (ref 0–44)
AST: 25 U/L (ref 15–41)
Albumin: 4.8 g/dL (ref 3.5–5.0)
Alkaline Phosphatase: 63 U/L (ref 38–126)
Anion gap: 5 (ref 5–15)
BUN: 15 mg/dL (ref 8–23)
CO2: 31 mmol/L (ref 22–32)
Calcium: 10.2 mg/dL (ref 8.9–10.3)
Chloride: 104 mmol/L (ref 98–111)
Creatinine: 0.84 mg/dL (ref 0.44–1.00)
GFR, Estimated: >60 mL/min (ref >60)
Glucose, Bld: 123 mg/dL — ABNORMAL HIGH (ref 70–99)
Potassium: 4.1 mmol/L (ref 3.5–5.1)
Sodium: 140 mmol/L (ref 135–145)
Total Bilirubin: 0.9 mg/dL (ref 0.3–1.2)
Total Protein: 6.7 g/dL (ref 6.5–8.1)

## 2022-05-08 LAB — CBC WITH DIFFERENTIAL (CANCER CENTER ONLY)
Abs Immature Granulocytes: 0 10*3/uL (ref 0.00–0.07)
Basophils Absolute: 0 10*3/uL (ref 0.0–0.1)
Basophils Relative: 1 %
Eosinophils Absolute: 0.1 10*3/uL (ref 0.0–0.5)
Eosinophils Relative: 2 %
HCT: 42.7 % (ref 36.0–46.0)
Hemoglobin: 14.6 g/dL (ref 12.0–15.0)
Immature Granulocytes: 0 %
Lymphocytes Relative: 27 %
Lymphs Abs: 1.3 10*3/uL (ref 0.7–4.0)
MCH: 29.9 pg (ref 26.0–34.0)
MCHC: 34.2 g/dL (ref 30.0–36.0)
MCV: 87.5 fL (ref 80.0–100.0)
Monocytes Absolute: 0.4 10*3/uL (ref 0.1–1.0)
Monocytes Relative: 8 %
Neutro Abs: 3 10*3/uL (ref 1.7–7.7)
Neutrophils Relative %: 62 %
Platelet Count: 213 10*3/uL (ref 150–400)
RBC: 4.88 MIL/uL (ref 3.87–5.11)
RDW: 12.4 % (ref 11.5–15.5)
WBC Count: 4.8 10*3/uL (ref 4.0–10.5)
nRBC: 0 % (ref 0.0–0.2)

## 2022-05-08 LAB — LACTATE DEHYDROGENASE: LDH: 218 U/L — ABNORMAL HIGH (ref 98–192)

## 2022-05-09 ENCOUNTER — Telehealth: Payer: Self-pay | Admitting: Hematology

## 2022-05-09 NOTE — Telephone Encounter (Signed)
Left message with follow-up appointment per 8/1 los. 

## 2022-05-14 NOTE — Progress Notes (Signed)
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  HISTORY OF PRESENTING ILLNESS:  Please see previous note for details on initial presentation  INTERVAL HISTORY:  [name] is here for continued evaluation and management of her splenic marginal zone lymphoma.  She notes no acute new symptoms since her last clinic visit. No fevers no chills no night sweats.  No new symptoms of inflammatory arthritis. No abdominal pain or distention. Labs done today were discussed in detail with the patient.  Past Medical History:  Diagnosis Date   Arthritis    Cancer ([place])    Hypertension     SURGICAL HISTORY: No past surgical history on file.  SOCIAL HISTORY: Social History   Socioeconomic History   Marital status: Married    Spouse name: Not on file   Number of children: Not on file   Years of education: Not on file   Highest education level: Not on file  Occupational History   Not on file  Tobacco Use   Smoking status: Never   Smokeless tobacco: Never  Vaping Use   Vaping Use: Never used  Substance and Sexual Activity   Alcohol use: Not Currently    Alcohol/week: 0.0 standard drinks of alcohol   Drug use: Never   Sexual activity: Not on file  Other Topics Concern   Not on file  Social History Narrative   Not on file   Social Determinants of Health   Financial Resource Strain: Not on file  Food Insecurity: Not on file  Transportation Needs: Not on file  Physical Activity: Not on file  Stress: Not on file  Social Connections: Not on file  Intimate Partner Violence: Not on file    FAMILY HISTORY: Family History  Problem Relation Age of Onset   Diabetes Mother    Hyperlipidemia Mother    Hypertension Mother    Rheum arthritis Brother     Diabetes Brother     ALLERGIES:  is allergic to biaxin [clarithromycin] and doxycycline.  MEDICATIONS:  Current Outpatient Medications  Medication Sig Dispense Refill   celecoxib (CELEBREX) 200 MG capsule TAKE 1 CAPSULE BY MOUTH ONCE DAILY WITH FOOD AS NEEDED 30 capsule 1   ciprofloxacin-dexamethasone (CIPRODEX) OTIC suspension Place 4 drops into affected ear 2 times per day for 7 days 7.5 mL 3   folic acid (FOLVITE) 400 MCG tablet Take 800 mcg by mouth daily.      losartan (COZAAR) 50 MG tablet TAKE 1 TABLET BY MOUTH ONCE DAILY 90 tablet 3   valACYclovir (VALTREX) 1000 MG tablet Take 1 tablet (1,000 mg total) by mouth 2 (two) times daily. 20 tablet 0   vitamin B-12 (CYANOCOBALAMIN) 1000 MCG tablet Take 1,000 mcg by mouth daily.     bismuth subsalicylate (PEPTO-BISMOL) 262 MG chewable tablet Chew and swallow 2 tablets by mouth 4 times daily for 14 days 112 tablet 0   doxycycline (VIBRA-TABS) 100 MG tablet Take 1 tablet by mouth twice daily for 14 days 28 tablet 0   metroNIDAZOLE (FLAGYL) 500 MG tablet Take 1/2 tablet (250 mg) by mouth four times daily for 14 days. 28 tablet 0   ondansetron (ZOFRAN ODT) 4 MG disintegrating tablet Dissolve 1 tablet (4 mg total) by mouth every 8 (eight) hours as needed for nausea or vomiting. (Patient not taking:  Reported on 01/24/2022) 20 tablet 0   pantoprazole (PROTONIX) 40 MG tablet TAKE 1 TABLET BY MOUTH ONCE A DAY (Patient not taking: Reported on 01/24/2022) 90 tablet 4   triamcinolone (KENALOG) 0.1 % triamcinolone acetonide 0.1 % topical cream  APPLY A THIN LAYER TO THE AFFECTED AREA(S) BY TOPICAL ROUTE 2 TIMES PER DAY (Patient not taking: Reported on 05/08/2022)     No current facility-administered medications for this visit.    REVIEW OF SYSTEMS:   10 Point review of Systems was done is negative except as noted above.  PHYSICAL EXAMINATION: ECOG FS:1 - Symptomatic but completely ambulatory  Vitals:   05/08/22 1440  BP: (!) 152/64  Pulse: 65   Resp: 15  Temp: 97.7 F (36.5 C)  SpO2: 100%    Wt Readings from Last 3 Encounters:  05/08/22 142 lb 4.8 oz (64.5 kg)  01/24/22 145 lb (65.8 kg)  11/08/21 151 lb 1.6 oz (68.5 kg)   Body mass index is 26.03 kg/m.   NAD GENERAL:alert, in no acute distress and comfortable SKIN: no acute rashes, no significant lesions EYES: conjunctiva are pink and non-injected, sclera anicteric OROPHARYNX: MMM, no exudates, no oropharyngeal erythema or ulceration NECK: supple, no JVD LYMPH:  no palpable lymphadenopathy in the cervical, axillary or inguinal regions LUNGS: clear to auscultation b/l with normal respiratory effort HEART: regular rate & rhythm ABDOMEN:  normoactive bowel sounds , non tender, not distended. Extremity: no pedal edema PSYCH: alert & oriented x 3 with fluent speech NEURO: no focal motor/sensory deficits   LABORATORY DATA:  I have reviewed the data as listed  .    Latest Ref Rng & Units 05/08/2022    2:34 PM 11/08/2021    2:30 PM 05/08/2021    2:41 PM  CBC  WBC 4.0 - 10.5 K/uL 4.8  5.5  6.2   Hemoglobin 12.0 - 15.0 g/dL 14.6  14.5  14.7   Hematocrit 36.0 - 46.0 % 42.7  42.0  42.5   Platelets 150 - 400 K/uL 213  237  247    CBC    Component Value Date/Time   WBC 4.8 05/08/2022 1434   WBC 5.5 11/08/2021 1430   RBC 4.88 05/08/2022 1434   HGB 14.6 05/08/2022 1434   HGB 12.8 09/10/2017 0801   HCT 42.7 05/08/2022 1434   HCT 39.4 09/10/2017 0801   PLT 213 05/08/2022 1434   PLT 144 (L) 09/10/2017 0801   MCV 87.5 05/08/2022 1434   MCV 79.0 (L) 09/10/2017 0801   MCH 29.9 05/08/2022 1434   MCHC 34.2 05/08/2022 1434   RDW 12.4 05/08/2022 1434   RDW 15.6 (H) 09/10/2017 0801   LYMPHSABS 1.3 05/08/2022 1434   LYMPHSABS 0.9 09/10/2017 0801   MONOABS 0.4 05/08/2022 1434   MONOABS 0.4 09/10/2017 0801   EOSABS 0.1 05/08/2022 1434   EOSABS 0.1 09/10/2017 0801   BASOSABS 0.0 05/08/2022 1434   BASOSABS 0.0 09/10/2017 0801    .    Latest Ref Rng & Units 05/08/2022     2:34 PM 11/08/2021    2:30 PM 05/08/2021    2:41 PM  CMP  Glucose 70 - 99 mg/dL 123  111  96   BUN 8 - 23 mg/dL '15  20  17   '$ Creatinine 0.44 - 1.00 mg/dL 0.84  0.58  0.76   Sodium 135 - 145 mmol/L 140  138  141   Potassium 3.5 - 5.1 mmol/L 4.1  3.6  4.1   Chloride 98 -  111 mmol/L 104  107  109   CO2 22 - 32 mmol/L '31  23  24   '$ Calcium 8.9 - 10.3 mg/dL 10.2  9.6  9.6   Total Protein 6.5 - 8.1 g/dL 6.7  6.7  6.6   Total Bilirubin 0.3 - 1.2 mg/dL 0.9  0.6  0.7   Alkaline Phos 38 - 126 U/L 63  78  91   AST 15 - 41 U/L '25  28  25   '$ ALT 0 - 44 U/L 41  49  47    . Lab Results  Component Value Date   LDH 218 (H) 05/08/2022   12/27/17 BM Bx:   12/27/17 Flow Cytometry:     RADIOGRAPHIC STUDIES: I have personally reviewed the radiological images as listed and agreed with the findings in the report. No results found.  ASSESSMENT & PLAN:   66 y.o. female with  1. 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 Patient appeared to be have significant constitutional symptoms . No evidence of rheumatological or infectious etiology for her fevers and night sweats. Constitutional symptoms now resolved.  2. S/p Splenomegaly - likely due to [place]. Marland Kitchenimproved on [place] abd. Clinical no palpable splenomegaly today.  04/18/18 US Abdomen revealed Splenomegaly with a length of 13.3 cm and a volume of 432 cc. By report, the spleen measured up to 15.1 cm in maximum dimension on the PET-CT from January 08, 2018 suggesting interval improvement.   3. S/p Anemia - with elevated LDH and low haptoglobin concerning for hemolysis. Coombs neg . Could be IgA driven Coombs neg AIHA. +ve reticulocytosis and Smear with some microspherocytes. No over urine hemosiderinuria. Partly anemia could also be from hypersplenism.    4.  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 Per rheumatology - no evidence of primary rheumatological disorder. No overt evidence of  endocarditis.  5. h/o Fevers/chills/night sweat -- likely constitutional symptoms related to ?lymphoma - now resolved. 6. Thrombocytopenia likely from splenomegaly vs lymphoma-- resolved. PLT resolved  PLAN:  -Patient notes no new symptoms or signs suggestive of lymphoma progression at this time. -Labs done today were reviewed in detail with the patient CBC within normal limits with hemoglobin of 14.6, platelets of 213k and normal WBC counts. CMP stable LDH borderline elevated at 216 -Patient has no lab or clinical evidence of significant splenic marginal zone lymphoma recurrence/progression at this time. -Continue active surveillance.  No clear indication for additional treatment of the patient's splenic marginal zone lymphoma at this time.  FOLLOW UP: RTC with Dr Irene Limbo with labs in 6 months  The total time spent in the appointment was 20 minutes*.  All of the patient's questions were answered with apparent satisfaction. The patient knows to call the clinic with any problems, questions or concerns.   Sullivan Lone MD MS AAHIVMS [facility] Hematology/Oncology Physician [facility]  .*Total Encounter Time as defined by the Centers for Medicare and Medicaid Services includes, in addition to the face-to-face time of a patient visit (documented in the note above) non-face-to-face time: obtaining and reviewing outside history, ordering and reviewing medications, tests or procedures, care coordination (communications with other health care professionals or caregivers) and documentation in the medical record.

## 2022-05-31 ENCOUNTER — Other Ambulatory Visit (HOSPITAL_COMMUNITY): Payer: Self-pay

## 2022-07-08 ENCOUNTER — Encounter: Payer: Self-pay | Admitting: Internal Medicine

## 2022-07-09 ENCOUNTER — Other Ambulatory Visit (HOSPITAL_COMMUNITY): Payer: Self-pay

## 2022-07-09 ENCOUNTER — Other Ambulatory Visit: Payer: Self-pay | Admitting: Hematology

## 2022-07-09 MED ORDER — VALACYCLOVIR HCL 1 G PO TABS
1000.0000 mg | ORAL_TABLET | Freq: Two times a day (BID) | ORAL | 0 refills | Status: AC
Start: 2022-07-09 — End: ?
  Filled 2022-07-09: qty 20, 10d supply, fill #0

## 2022-07-10 DIAGNOSIS — Z1231 Encounter for screening mammogram for malignant neoplasm of breast: Secondary | ICD-10-CM | POA: Diagnosis not present

## 2022-07-10 DIAGNOSIS — M25561 Pain in right knee: Secondary | ICD-10-CM | POA: Diagnosis not present

## 2022-07-10 DIAGNOSIS — M1711 Unilateral primary osteoarthritis, right knee: Secondary | ICD-10-CM | POA: Diagnosis not present

## 2022-07-10 LAB — HM MAMMOGRAPHY

## 2022-07-13 ENCOUNTER — Other Ambulatory Visit (HOSPITAL_COMMUNITY): Payer: Self-pay

## 2022-07-16 ENCOUNTER — Other Ambulatory Visit (HOSPITAL_COMMUNITY): Payer: Self-pay

## 2022-07-16 MED ORDER — PANTOPRAZOLE SODIUM 40 MG PO TBEC
40.0000 mg | DELAYED_RELEASE_TABLET | Freq: Every day | ORAL | 1 refills | Status: DC
  Filled 2022-07-16 – 2022-08-08 (×2): qty 90, 90d supply, fill #0

## 2022-08-01 ENCOUNTER — Other Ambulatory Visit (HOSPITAL_COMMUNITY): Payer: Self-pay

## 2022-08-08 ENCOUNTER — Other Ambulatory Visit (HOSPITAL_COMMUNITY): Payer: Self-pay

## 2022-08-08 ENCOUNTER — Other Ambulatory Visit: Payer: Self-pay | Admitting: Internal Medicine

## 2022-08-10 ENCOUNTER — Other Ambulatory Visit (HOSPITAL_COMMUNITY): Payer: Self-pay

## 2022-08-10 ENCOUNTER — Other Ambulatory Visit: Payer: Self-pay | Admitting: Internal Medicine

## 2022-08-10 MED ORDER — CELECOXIB 200 MG PO CAPS
200.0000 mg | ORAL_CAPSULE | Freq: Every day | ORAL | 1 refills | Status: DC | PRN
  Filled 2022-08-10: qty 30, 30d supply, fill #0

## 2022-08-16 ENCOUNTER — Other Ambulatory Visit (HOSPITAL_COMMUNITY): Payer: Self-pay

## 2022-08-21 ENCOUNTER — Encounter: Payer: Self-pay | Admitting: Internal Medicine

## 2022-08-21 DIAGNOSIS — H9192 Unspecified hearing loss, left ear: Secondary | ICD-10-CM | POA: Insufficient documentation

## 2022-08-21 DIAGNOSIS — R739 Hyperglycemia, unspecified: Secondary | ICD-10-CM | POA: Insufficient documentation

## 2022-08-21 NOTE — Patient Instructions (Addendum)
Blood work was ordered.   The lab is on the first floor.    Medications changes include :   none     Return in about 1 year (around 08/23/2023) for Physical Exam.   Health Maintenance, Female Adopting a healthy lifestyle and getting preventive care are important in promoting health and wellness. Ask your health care provider about: The right schedule for you to have regular tests and exams. Things you can do on your own to prevent diseases and keep yourself healthy. What should I know about diet, weight, and exercise? Eat a healthy diet  Eat a diet that includes plenty of vegetables, fruits, low-fat dairy products, and lean protein. Do not eat a lot of foods that are high in solid fats, added sugars, or sodium. Maintain a healthy weight Body mass index (BMI) is used to identify weight problems. It estimates body fat based on height and weight. Your health care provider can help determine your BMI and help you achieve or maintain a healthy weight. Get regular exercise Get regular exercise. This is one of the most important things you can do for your health. Most adults should: Exercise for at least 150 minutes each week. The exercise should increase your heart rate and make you sweat (moderate-intensity exercise). Do strengthening exercises at least twice a week. This is in addition to the moderate-intensity exercise. Spend less time sitting. Even light physical activity can be beneficial. Watch cholesterol and blood lipids Have your blood tested for lipids and cholesterol at 66 years of age, then have this test every 5 years. Have your cholesterol levels checked more often if: Your lipid or cholesterol levels are high. You are older than 66 years of age. You are at high risk for heart disease. What should I know about cancer screening? Depending on your health history and family history, you may need to have cancer screening at various ages. This may include screening  for: Breast cancer. Cervical cancer. Colorectal cancer. Skin cancer. Lung cancer. What should I know about heart disease, diabetes, and high blood pressure? Blood pressure and heart disease High blood pressure causes heart disease and increases the risk of stroke. This is more likely to develop in people who have high blood pressure readings or are overweight. Have your blood pressure checked: Every 3-5 years if you are 68-74 years of age. Every year if you are 24 years old or older. Diabetes Have regular diabetes screenings. This checks your fasting blood sugar level. Have the screening done: Once every three years after age 82 if you are at a normal weight and have a low risk for diabetes. More often and at a younger age if you are overweight or have a high risk for diabetes. What should I know about preventing infection? Hepatitis B If you have a higher risk for hepatitis B, you should be screened for this virus. Talk with your health care provider to find out if you are at risk for hepatitis B infection. Hepatitis C Testing is recommended for: Everyone born from [birth year] through 1965. Anyone with known risk factors for hepatitis C. Sexually transmitted infections (STIs) Get screened for STIs, including gonorrhea and chlamydia, if: You are sexually active and are younger than 66 years of age. You are older than 66 years of age and your health care provider tells you that you are at risk for this type of infection. Your sexual activity has changed since you were last screened, and you are at  increased risk for chlamydia or gonorrhea. Ask your health care provider if you are at risk. Ask your health care provider about whether you are at high risk for HIV. Your health care provider may recommend a prescription medicine to help prevent HIV infection. If you choose to take medicine to prevent HIV, you should first get tested for HIV. You should then be tested every 3 months for as long as you  are taking the medicine. Pregnancy If you are about to stop having your period (premenopausal) and you may become pregnant, seek counseling before you get pregnant. Take 400 to 800 micrograms (mcg) of folic acid every day if you become pregnant. Ask for birth control (contraception) if you want to prevent pregnancy. Osteoporosis and menopause Osteoporosis is a disease in which the bones lose minerals and strength with aging. This can result in bone fractures. If you are 62 years old or older, or if you are at risk for osteoporosis and fractures, ask your health care provider if you should: Be screened for bone loss. Take a calcium or vitamin D supplement to lower your risk of fractures. Be given hormone replacement therapy (HRT) to treat symptoms of menopause. Follow these instructions at home: Alcohol use Do not drink alcohol if: Your health care provider tells you not to drink. You are pregnant, may be pregnant, or are planning to become pregnant. If you drink alcohol: Limit how much you have to: 0-1 drink a day. Know how much alcohol is in your drink. In the [place] one drink equals one 12 oz bottle of beer (355 mL), one 5 oz glass of wine (148 mL), or one 1 oz glass of hard liquor (44 mL). Lifestyle Do not use any products that contain nicotine or tobacco. These products include cigarettes, chewing tobacco, and vaping devices, such as e-cigarettes. If you need help quitting, ask your health care provider. Do not use street drugs. Do not share needles. Ask your health care provider for help if you need support or information about quitting drugs. General instructions Schedule regular health, dental, and eye exams. Stay current with your vaccines. Tell your health care provider if: You often feel depressed. You have ever been abused or do not feel safe at home. Summary Adopting a healthy lifestyle and getting preventive care are important in promoting health and wellness. Follow your  health care provider's instructions about healthy diet, exercising, and getting tested or screened for diseases. Follow your health care provider's instructions on monitoring your cholesterol and blood pressure. This information is not intended to replace advice given to you by your health care provider. Make sure you discuss any questions you have with your health care provider. Document Revised: 02/13/2021 Document Reviewed: 02/13/2021 Elsevier Patient Education  [place].

## 2022-08-21 NOTE — Progress Notes (Unsigned)
Subjective:    Patient ID: Emily Foley, female    DOB: April 19, 1956, 66 y.o.   MRN: [id number]      HPI Cylee is here for a Physical exam.    She developed cold symptoms - they are mild and she thinks they are just viral. Covid test today at home was neg.    She has been having lower abdominal pain associated with diarrhea - has seen Dr Collene Mares - treated with xifaxan and it helped, but has symptoms intermittently - maybe related to her poor diet.  Has hpylori that she still needs to treat - she has the medication at home.   Medications and allergies reviewed with patient and updated if appropriate.  Current Outpatient Medications on File Prior to Visit  Medication Sig Dispense Refill   folic acid (FOLVITE) 160 MCG tablet Take 800 mcg by mouth daily.      losartan (COZAAR) 50 MG tablet TAKE 1 TABLET BY MOUTH ONCE DAILY 90 tablet 3   pantoprazole (PROTONIX) 40 MG tablet Take 1 tablet (40 mg total) by mouth daily. 90 tablet 1   valACYclovir (VALTREX) 1000 MG tablet Take 1 tablet (1,000 mg total) by mouth 2 (two) times daily. 20 tablet 0   vitamin B-12 (CYANOCOBALAMIN) 1000 MCG tablet Take 1,000 mcg by mouth daily.     No current facility-administered medications on file prior to visit.    Review of Systems  Constitutional:  Negative for fever.  HENT:  Positive for congestion, postnasal drip and sore throat.   Eyes:  Negative for visual disturbance.  Respiratory:  Positive for cough and chest tightness (occ with cold weather and walking an incline). Negative for shortness of breath and wheezing.   Cardiovascular:  Negative for chest pain, palpitations (occ PVCs) and leg swelling.  Gastrointestinal:  Positive for abdominal pain (lower abd pain) and diarrhea. Negative for blood in stool, constipation and nausea.       Gerd occ  Genitourinary:  Negative for dysuria.  Musculoskeletal:  Positive for arthralgias (right knee) and back pain.       Leg cramping  Skin:  Negative for rash.   Neurological:  Negative for light-headedness and headaches.  Psychiatric/Behavioral:  Negative for dysphoric mood. The patient is not nervous/anxious.        Objective:   Vitals:   08/22/22 1512 08/22/22 1605  BP: (!) 148/78 (!) 140/72  Pulse: 85   Temp: 98.6 F (37 C)   SpO2: 96%    Filed Weights   08/22/22 1512  Weight: 143 lb (64.9 kg)   Body mass index is 26.16 kg/m.  BP Readings from Last 3 Encounters:  08/22/22 (!) 140/72  05/08/22 (!) 152/64  01/24/22 (!) 142/88    Wt Readings from Last 3 Encounters:  08/22/22 143 lb (64.9 kg)  05/08/22 142 lb 4.8 oz (64.5 kg)  01/24/22 145 lb (65.8 kg)       Physical Exam Constitutional: She appears well-developed and well-nourished. No distress.  HENT:  Head: Normocephalic and atraumatic.  Right Ear: External ear normal. Normal ear canal and TM Left Ear: External ear normal.  Normal ear canal and TM Mouth/Throat: Oropharynx is clear and moist.  Eyes: Conjunctivae normal.  Neck: Neck supple. No tracheal deviation present. No thyromegaly present.  No carotid bruit  Cardiovascular: Normal rate, regular rhythm and normal heart sounds.   No murmur heard.  No edema. Pulmonary/Chest: Effort normal and breath sounds normal. No respiratory distress. She has no wheezes.  She has no rales.  Breast: deferred   Abdominal: Soft. She exhibits no distension. There is no tenderness.  Lymphadenopathy: She has no cervical adenopathy.  Skin: Skin is warm and dry. She is not diaphoretic.  Psychiatric: She has a normal mood and affect. Her behavior is normal.     Lab Results  Component Value Date   WBC 4.8 05/08/2022   HGB 14.6 05/08/2022   HCT 42.7 05/08/2022   PLT 213 05/08/2022   GLUCOSE 123 (H) 05/08/2022   CHOL 196 08/22/2022   TRIG 223.0 (H) 08/22/2022   HDL 46.00 08/22/2022   LDLDIRECT 118.0 08/22/2022   ALT 41 05/08/2022   AST 25 05/08/2022   NA 140 05/08/2022   K 4.1 05/08/2022   CL 104 05/08/2022   CREATININE  0.84 05/08/2022   BUN 15 05/08/2022   CO2 31 05/08/2022   TSH 1.18 08/22/2022   HGBA1C 5.4 08/22/2022         Assessment & Plan:   Physical exam: Screening blood work  ordered Exercise  walking more Weight   ok  - she wants to lose weight Substance abuse  none  Needs to work on diet and maintaining regular exercise.   Reviewed recommended immunizations.   Health Maintenance  Topic Date Due   Zoster Vaccines- Shingrix (1 of 2) Never done   COLONOSCOPY (Pts 45-64yr Insurance coverage will need to be confirmed)  Never done   Pneumonia Vaccine 66 Years old (1 - PCV) Never done   DEXA SCAN  Never done   COVID-19 Vaccine (2 - Pfizer risk series) 09/07/2022 (Originally 07/20/2021)   TETANUS/TDAP  08/23/2023 (Originally 08/15/1975)   MAMMOGRAM  07/05/2023   INFLUENZA VACCINE  Completed   Hepatitis C Screening  Completed   HPV VACCINES  Aged Out          See Problem List for Assessment and Plan of chronic medical problems.

## 2022-08-22 ENCOUNTER — Other Ambulatory Visit (HOSPITAL_COMMUNITY): Payer: Self-pay

## 2022-08-22 ENCOUNTER — Ambulatory Visit (INDEPENDENT_AMBULATORY_CARE_PROVIDER_SITE_OTHER): Payer: 59 | Admitting: Internal Medicine

## 2022-08-22 VITALS — BP 140/72 | HR 85 | Temp 98.6°F | Ht 62.0 in | Wt 143.0 lb

## 2022-08-22 DIAGNOSIS — Z0000 Encounter for general adult medical examination without abnormal findings: Secondary | ICD-10-CM | POA: Diagnosis not present

## 2022-08-22 DIAGNOSIS — K219 Gastro-esophageal reflux disease without esophagitis: Secondary | ICD-10-CM | POA: Diagnosis not present

## 2022-08-22 DIAGNOSIS — R739 Hyperglycemia, unspecified: Secondary | ICD-10-CM

## 2022-08-22 DIAGNOSIS — H9192 Unspecified hearing loss, left ear: Secondary | ICD-10-CM

## 2022-08-22 DIAGNOSIS — J069 Acute upper respiratory infection, unspecified: Secondary | ICD-10-CM | POA: Insufficient documentation

## 2022-08-22 DIAGNOSIS — R1030 Lower abdominal pain, unspecified: Secondary | ICD-10-CM | POA: Insufficient documentation

## 2022-08-22 DIAGNOSIS — I10 Essential (primary) hypertension: Secondary | ICD-10-CM

## 2022-08-22 LAB — LIPID PANEL
Cholesterol: 196 mg/dL (ref 0–200)
HDL: 46 mg/dL (ref >39.00)
NonHDL: 149.56
Total CHOL/HDL Ratio: 4
Triglycerides: 223 mg/dL — ABNORMAL HIGH (ref 0.0–149.0)
VLDL: 44.6 mg/dL — ABNORMAL HIGH (ref 0.0–40.0)

## 2022-08-22 LAB — HEMOGLOBIN A1C: Hgb A1c MFr Bld: 5.4 % (ref 4.6–6.5)

## 2022-08-22 LAB — TSH: TSH: 1.18 u[IU]/mL (ref 0.35–5.50)

## 2022-08-22 LAB — LDL CHOLESTEROL, DIRECT: Direct LDL: 118 mg/dL

## 2022-08-22 MED ORDER — CELECOXIB 200 MG PO CAPS
200.0000 mg | ORAL_CAPSULE | Freq: Two times a day (BID) | ORAL | 3 refills | Status: AC | PRN
  Filled 2022-08-22 – 2022-09-11 (×2): qty 180, 90d supply, fill #0

## 2022-08-22 NOTE — Assessment & Plan Note (Signed)
Subacute Has been having intermittent lower abdominal pain associated with diarrhea Was treated with Xifaxan by Dr. Collene Mares and that did seem to help temporarily, but still having symptoms ?  Related to diet, SIBO, H. pylori versus other We will work on diet We will treat herself for the H. pylori If symptoms persist needs CT scan

## 2022-08-22 NOTE — Assessment & Plan Note (Signed)
Chronic Blood pressure not ideally controlled She will start monitoring her blood pressure at home regularly Continue losartan 50 mg once daily

## 2022-08-22 NOTE — Assessment & Plan Note (Signed)
Continue Likely viral in nature Continue symptomatic treatment She will let me know if her symptoms worsen or do not improve

## 2022-08-22 NOTE — Assessment & Plan Note (Signed)
Chronic GERD controlled Continue pantoprazole 40 mg daily 

## 2022-08-22 NOTE — Assessment & Plan Note (Signed)
Chronic Check a1c Low sugar / carb diet Stressed regular exercise  

## 2022-08-23 ENCOUNTER — Other Ambulatory Visit: Payer: Self-pay | Admitting: Internal Medicine

## 2022-08-23 ENCOUNTER — Other Ambulatory Visit (HOSPITAL_COMMUNITY): Payer: Self-pay

## 2022-08-23 MED ORDER — LOSARTAN POTASSIUM 50 MG PO TABS
50.0000 mg | ORAL_TABLET | Freq: Every day | ORAL | 3 refills | Status: DC
  Filled 2022-08-23: qty 90, 90d supply, fill #0
  Filled 2022-12-10: qty 90, 90d supply, fill #1

## 2022-09-11 ENCOUNTER — Other Ambulatory Visit (HOSPITAL_COMMUNITY): Payer: Self-pay

## 2022-09-21 ENCOUNTER — Other Ambulatory Visit: Payer: Self-pay

## 2022-10-02 ENCOUNTER — Encounter: Payer: Self-pay | Admitting: Internal Medicine

## 2022-10-05 ENCOUNTER — Encounter: Payer: Self-pay | Admitting: Internal Medicine

## 2022-10-05 NOTE — Progress Notes (Signed)
Outside notes received. Information abstracted. Notes sent to scan.  

## 2022-11-08 ENCOUNTER — Other Ambulatory Visit: Payer: Self-pay

## 2022-11-08 DIAGNOSIS — C8307 Small cell B-cell lymphoma, spleen: Secondary | ICD-10-CM

## 2022-11-09 ENCOUNTER — Inpatient Hospital Stay: Payer: Medicare Other | Attending: Hematology | Admitting: Hematology

## 2022-11-09 ENCOUNTER — Inpatient Hospital Stay: Payer: Medicare Other

## 2022-11-09 VITALS — BP 147/78 | HR 80 | Temp 97.9°F | Resp 16 | Wt 147.3 lb

## 2022-11-09 DIAGNOSIS — C8307 Small cell B-cell lymphoma, spleen: Secondary | ICD-10-CM

## 2022-11-09 DIAGNOSIS — Z8616 Personal history of COVID-19: Secondary | ICD-10-CM | POA: Diagnosis not present

## 2022-11-09 DIAGNOSIS — C8300 Small cell B-cell lymphoma, unspecified site: Secondary | ICD-10-CM | POA: Insufficient documentation

## 2022-11-09 LAB — CBC WITH DIFFERENTIAL (CANCER CENTER ONLY)
Abs Immature Granulocytes: 0.01 10*3/uL (ref 0.00–0.07)
Basophils Absolute: 0.1 10*3/uL (ref 0.0–0.1)
Basophils Relative: 1 %
Eosinophils Absolute: 0.2 10*3/uL (ref 0.0–0.5)
Eosinophils Relative: 3 %
HCT: 43.5 % (ref 36.0–46.0)
Hemoglobin: 15.2 g/dL — ABNORMAL HIGH (ref 12.0–15.0)
Immature Granulocytes: 0 %
Lymphocytes Relative: 28 %
Lymphs Abs: 1.5 10*3/uL (ref 0.7–4.0)
MCH: 29.9 pg (ref 26.0–34.0)
MCHC: 34.9 g/dL (ref 30.0–36.0)
MCV: 85.6 fL (ref 80.0–100.0)
Monocytes Absolute: 0.5 10*3/uL (ref 0.1–1.0)
Monocytes Relative: 9 %
Neutro Abs: 3.2 10*3/uL (ref 1.7–7.7)
Neutrophils Relative %: 59 %
Platelet Count: 229 10*3/uL (ref 150–400)
RBC: 5.08 MIL/uL (ref 3.87–5.11)
RDW: 12.6 % (ref 11.5–15.5)
WBC Count: 5.4 10*3/uL (ref 4.0–10.5)
nRBC: 0 % (ref 0.0–0.2)

## 2022-11-09 LAB — CMP (CANCER CENTER ONLY)
ALT: 45 U/L — ABNORMAL HIGH (ref 0–44)
AST: 24 U/L (ref 15–41)
Albumin: 4.5 g/dL (ref 3.5–5.0)
Alkaline Phosphatase: 77 U/L (ref 38–126)
Anion gap: 7 (ref 5–15)
BUN: 17 mg/dL (ref 8–23)
CO2: 26 mmol/L (ref 22–32)
Calcium: 9.7 mg/dL (ref 8.9–10.3)
Chloride: 108 mmol/L (ref 98–111)
Creatinine: 0.74 mg/dL (ref 0.44–1.00)
GFR, Estimated: >60 mL/min (ref >60)
Glucose, Bld: 95 mg/dL (ref 70–99)
Potassium: 4.2 mmol/L (ref 3.5–5.1)
Sodium: 141 mmol/L (ref 135–145)
Total Bilirubin: 0.7 mg/dL (ref 0.3–1.2)
Total Protein: 6.3 g/dL — ABNORMAL LOW (ref 6.5–8.1)

## 2022-11-09 LAB — LACTATE DEHYDROGENASE: LDH: 215 U/L — ABNORMAL HIGH (ref 98–192)

## 2022-11-09 NOTE — Progress Notes (Signed)
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  HISTORY OF PRESENTING ILLNESS:  Please see previous note for details on initial presentation  INTERVAL HISTORY:  [name] is here for continued evaluation and management of her splenic marginal zone lymphoma.    Patient was last seen by me on 05/08/22 and was doing well overall with no new medical concerns.  Today, she reports that she previously tested positive for COVID-19 over the holidays. Her symptoms felt more like a general cold and she did not take any medications.   Patient is UTD with influenza and COVID-19 booster vaccinations. However, she has not received the RSV or shingles vaccinations. She reports that she was previously tested for Hepatitis B.  She experiences some SOB and tightness in her chest when walking in cold temperatures. This has occurred prior to her COVID infection. She also reports fatigue which she attributes to her age. She does endorse some occasional abdominal pain. No new joint pains or swelling.   She has been taking vitamin B12 and folate. She does not take Protonix daily, but continues to take it. She continues to FU with her PCP once a year.    Past Medical History:  Diagnosis Date   Arthritis    Cancer ([place])    Hypertension     SURGICAL HISTORY: No past surgical history on file.  SOCIAL HISTORY: Social History   Socioeconomic History   Marital status: Married    Spouse name: Not on file   Number of children: Not on file   Years of education: Not on file   Highest education level: Not on file  Occupational History   Not on file  Tobacco Use   Smoking status: Never   Smokeless tobacco: Never  Vaping Use   Vaping Use: Never used  Substance and Sexual  Activity   Alcohol use: Not Currently    Alcohol/week: 0.0 standard drinks of alcohol   Drug use: Never   Sexual activity: Not on file  Other Topics Concern   Not on file  Social History Narrative   Not on file   Social Determinants of Health   Financial Resource Strain: Not on file  Food Insecurity: Not on file  Transportation Needs: Not on file  Physical Activity: Not on file  Stress: Not on file  Social Connections: Not on file  Intimate Partner Violence: Not on file    FAMILY HISTORY: Family History  Problem Relation Age of Onset   Diabetes Mother    Hyperlipidemia Mother    Hypertension Mother    Rheum arthritis Brother    Diabetes Brother     ALLERGIES:  is allergic to biaxin [clarithromycin] and doxycycline.  MEDICATIONS:  Current Outpatient Medications  Medication Sig Dispense Refill   celecoxib (CELEBREX) 200 MG capsule Take 1 capsule (200 mg total) by mouth 2 (two) times daily as needed. 469 capsule 3   folic acid (FOLVITE) 629 MCG tablet Take 800 mcg by mouth daily.      losartan (COZAAR) 50 MG tablet Take 1 tablet (50 mg total) by mouth daily. 90 tablet 3   pantoprazole (PROTONIX) 40 MG tablet Take 1 tablet (40 mg total) by mouth daily. 90 tablet 1   valACYclovir (VALTREX) 1000 MG tablet Take 1 tablet (1,000 mg total) by  mouth 2 (two) times daily. 20 tablet 0   vitamin B-12 (CYANOCOBALAMIN) 1000 MCG tablet Take 1,000 mcg by mouth daily.     No current facility-administered medications for this visit.    REVIEW OF SYSTEMS:    10 Point review of Systems was done is negative except as noted above.   PHYSICAL EXAMINATION: ECOG FS:1 - Symptomatic but completely ambulatory  Vitals:   11/09/22 1356  BP: (!) 147/78  Pulse: 80  Resp: 16  Temp: 97.9 F (36.6 C)  SpO2: 98%     Wt Readings from Last 3 Encounters:  08/22/22 143 lb (64.9 kg)  05/08/22 142 lb 4.8 oz (64.5 kg)  01/24/22 145 lb (65.8 kg)   Body mass index is 26.94 kg/m.     GENERAL:alert, in no acute distress and comfortable SKIN: no acute rashes, no significant lesions EYES: conjunctiva are pink and non-injected, sclera anicteric OROPHARYNX: MMM, no exudates, no oropharyngeal erythema or ulceration NECK: supple, no JVD LYMPH:  no palpable lymphadenopathy in the cervical, axillary or inguinal regions LUNGS: clear to auscultation b/l with normal respiratory effort HEART: regular rate & rhythm ABDOMEN:  normoactive bowel sounds , non tender, not distended. Extremity: no pedal edema PSYCH: alert & oriented x 3 with fluent speech NEURO: no focal motor/sensory deficits  LABORATORY DATA:  I have reviewed the data as listed  .    Latest Ref Rng & Units 11/09/2022    1:25 PM 05/08/2022    2:34 PM 11/08/2021    2:30 PM  CBC  WBC 4.0 - 10.5 K/uL 5.4  4.8  5.5   Hemoglobin 12.0 - 15.0 g/dL 15.2  14.6  14.5   Hematocrit 36.0 - 46.0 % 43.5  42.7  42.0   Platelets 150 - 400 K/uL 229  213  237    CBC    Component Value Date/Time   WBC 4.8 05/08/2022 1434   WBC 5.5 11/08/2021 1430   RBC 4.88 05/08/2022 1434   HGB 14.6 05/08/2022 1434   HGB 12.8 09/10/2017 0801   HCT 42.7 05/08/2022 1434   HCT 39.4 09/10/2017 0801   PLT 213 05/08/2022 1434   PLT 144 (L) 09/10/2017 0801   MCV 87.5 05/08/2022 1434   MCV 79.0 (L) 09/10/2017 0801   MCH 29.9 05/08/2022 1434   MCHC 34.2 05/08/2022 1434   RDW 12.4 05/08/2022 1434   RDW 15.6 (H) 09/10/2017 0801   LYMPHSABS 1.3 05/08/2022 1434   LYMPHSABS 0.9 09/10/2017 0801   MONOABS 0.4 05/08/2022 1434   MONOABS 0.4 09/10/2017 0801   EOSABS 0.1 05/08/2022 1434   EOSABS 0.1 09/10/2017 0801   BASOSABS 0.0 05/08/2022 1434   BASOSABS 0.0 09/10/2017 0801    .    Latest Ref Rng & Units 11/09/2022    1:25 PM 05/08/2022    2:34 PM 11/08/2021    2:30 PM  CMP  Glucose 70 - 99 mg/dL 95  123  111   BUN 8 - 23 mg/dL '17  15  20   '$ Creatinine 0.44 - 1.00 mg/dL 0.74  0.84  0.58   Sodium 135 - 145 mmol/L 141  140  138   Potassium 3.5 -  5.1 mmol/L 4.2  4.1  3.6   Chloride 98 - 111 mmol/L 108  104  107   CO2 22 - 32 mmol/L '26  31  23   '$ Calcium 8.9 - 10.3 mg/dL 9.7  10.2  9.6   Total Protein 6.5 - 8.1 g/dL 6.3  6.7  6.7   Total Bilirubin 0.3 - 1.2 mg/dL 0.7  0.9  0.6   Alkaline Phos 38 - 126 U/L 77  63  78   AST 15 - 41 U/L '24  25  28   '$ ALT 0 - 44 U/L 45  41  49    . Lab Results  Component Value Date   LDH 215 (H) 11/09/2022   12/27/17 BM Bx:   12/27/17 Flow Cytometry:     RADIOGRAPHIC STUDIES: I have personally reviewed the radiological images as listed and agreed with the findings in the report. No results found.  ASSESSMENT & PLAN:   68 y.o. female with  1. 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 Patient appeared to be have significant constitutional symptoms . No evidence of rheumatological or infectious etiology for her fevers and night sweats. Constitutional symptoms now resolved.  2. S/p Splenomegaly - likely due to [place]. Marland Kitchenimproved on [place] abd. Clinical no palpable splenomegaly today.  04/18/18 US Abdomen revealed Splenomegaly with a length of 13.3 cm and a volume of 432 cc. By report, the spleen measured up to 15.1 cm in maximum dimension on the PET-CT from January 08, 2018 suggesting interval improvement.   3. S/p Anemia - with elevated LDH and low haptoglobin concerning for hemolysis. Coombs neg . Could be IgA driven Coombs neg AIHA. +ve reticulocytosis and Smear with some microspherocytes. No over urine hemosiderinuria. Partly anemia could also be from hypersplenism.    4.  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 Per rheumatology - no evidence of primary rheumatological disorder. No overt evidence of endocarditis.  5. h/o Fevers/chills/night sweat -- likely constitutional symptoms related to ?lymphoma - now resolved. 6. Thrombocytopenia likely from splenomegaly vs lymphoma-- resolved. PLT resolved  PLAN:  -Discussed lab results on  11/09/22  with patient. No lymphocytosis. CBC showed WBC of 5.4 K, hemoglobin of 15.2, and platelets of 229 K. -CMP revealed borderline ALT level of 45 normal. Liver enzymes normal. -LDH 215 -mammogram normal -recommended pt to receive RSV and Shingles vaccinations -okay to take Hepatitis B booster vaccination - Reccommended pt to take vitamin B complex or multivitamin -Continue active surveillance.  No clear indication progression or need for additional treatment of the patient's splenic marginal zone lymphoma at this time.  FOLLOW UP: RTC with Dr Irene Limbo with labs in 6 months  I,Mitra Faeizi,acting as a Education administrator for Sullivan Lone, MD.,have documented all relevant documentation on the behalf of Sullivan Lone, MD,as directed by  Sullivan Lone, MD while in the presence of Sullivan Lone, MD.  .I have reviewed the above documentation for accuracy and completeness, and I agree with the above. Brunetta Genera MD

## 2022-11-12 ENCOUNTER — Telehealth: Payer: Self-pay | Admitting: Hematology

## 2022-11-12 NOTE — Telephone Encounter (Signed)
Called patient per 2/2 los notes to schedule f/u. Patient scheduled and notified.

## 2022-12-11 ENCOUNTER — Other Ambulatory Visit (HOSPITAL_COMMUNITY): Payer: Self-pay

## 2022-12-17 ENCOUNTER — Encounter: Payer: Self-pay | Admitting: Internal Medicine

## 2022-12-23 ENCOUNTER — Encounter: Payer: Self-pay | Admitting: Internal Medicine

## 2022-12-23 DIAGNOSIS — R002 Palpitations: Secondary | ICD-10-CM | POA: Insufficient documentation

## 2022-12-23 NOTE — Progress Notes (Unsigned)
Subjective:    Patient ID: Emily Foley, female    DOB: 1956-07-04, 67 y.o.   MRN: [id number]      HPI Emily Foley is here for  Chief Complaint  Patient presents with   Palpitations     H/o PVC's.  Has felt an abnormal rhythm and when she felt her pulse there was some irregularity.  She wondered if it was PACs versus PVCs.  She does not feel that she has atrial fibrillation at this time, but did not want to be at risk for.  Feels pressure in her chest when lays on stomach - better when rolls over.     Riding stationary bike and walking.  No concerning symptoms while doing those activities.   Medications and allergies reviewed with patient and updated if appropriate.  Current Outpatient Medications on File Prior to Visit  Medication Sig Dispense Refill   celecoxib (CELEBREX) 200 MG capsule Take 1 capsule (200 mg total) by mouth 2 (two) times daily as needed. [id number] capsule 3   folic acid (FOLVITE) Q000111Q MCG tablet Take 800 mcg by mouth daily.      losartan (COZAAR) 50 MG tablet Take 1 tablet (50 mg total) by mouth daily. 90 tablet 3   pantoprazole (PROTONIX) 40 MG tablet Take 1 tablet (40 mg total) by mouth daily. 90 tablet 1   valACYclovir (VALTREX) 1000 MG tablet Take 1 tablet (1,000 mg total) by mouth 2 (two) times daily. 20 tablet 0   vitamin B-12 (CYANOCOBALAMIN) 1000 MCG tablet Take 1,000 mcg by mouth daily.     No current facility-administered medications on file prior to visit.    Review of Systems  Respiratory:  Positive for shortness of breath (mild with exertion).   Cardiovascular:  Positive for chest pain (discomfort at times - tightness) and palpitations.  Neurological:  Positive for headaches (occ). Negative for dizziness and light-headedness.       Objective:   Vitals:   12/24/22 0803  BP: (!) 148/90  Pulse: 75  Temp: 98.4 F (36.9 C)  SpO2: 96%   BP Readings from Last 3 Encounters:  12/24/22 (!) 148/90  11/09/22 (!) 147/78  08/22/22 (!) 140/72   Wt  Readings from Last 3 Encounters:  12/24/22 146 lb (66.2 kg)  11/09/22 147 lb 4.8 oz (66.8 kg)  08/22/22 143 lb (64.9 kg)   Body mass index is 26.7 kg/m.    Physical Exam Constitutional:      General: She is not in acute distress.    Appearance: Normal appearance.  HENT:     Head: Normocephalic and atraumatic.  Eyes:     Conjunctiva/sclera: Conjunctivae normal.  Cardiovascular:     Rate and Rhythm: Normal rate and regular rhythm.     Heart sounds: Normal heart sounds.  Pulmonary:     Effort: Pulmonary effort is normal. No respiratory distress.     Breath sounds: Normal breath sounds. No wheezing.  Musculoskeletal:     Cervical back: Neck supple.     Right lower leg: No edema.     Left lower leg: No edema.  Lymphadenopathy:     Cervical: No cervical adenopathy.  Skin:    General: Skin is warm and dry.     Findings: No rash.  Neurological:     Mental Status: She is alert. Mental status is at baseline.  Psychiatric:        Mood and Affect: Mood normal.        Behavior: Behavior normal.  Assessment & Plan:    See Problem List for Assessment and Plan of chronic medical problems.

## 2022-12-23 NOTE — Patient Instructions (Addendum)
      Blood work was ordered.   The lab is on the first floor.    Medications changes include :       A referral was ordered for XXX.     Someone will call you to schedule an appointment.    Return if symptoms worsen or fail to improve.

## 2022-12-24 ENCOUNTER — Ambulatory Visit (INDEPENDENT_AMBULATORY_CARE_PROVIDER_SITE_OTHER): Payer: Medicare Other | Admitting: Internal Medicine

## 2022-12-24 VITALS — BP 138/80 | HR 75 | Temp 98.4°F | Ht 62.0 in | Wt 146.0 lb

## 2022-12-24 DIAGNOSIS — R002 Palpitations: Secondary | ICD-10-CM | POA: Diagnosis not present

## 2022-12-24 DIAGNOSIS — I10 Essential (primary) hypertension: Secondary | ICD-10-CM

## 2022-12-24 MED ORDER — CIPROFLOXACIN-DEXAMETHASONE 0.3-0.1 % OT SUSP: 4.0000 [drp] | Freq: Two times a day (BID) | OTIC | 5 refills | Status: DC | PRN

## 2022-12-24 MED ORDER — LOSARTAN POTASSIUM 50 MG PO TABS: 50.0000 mg | ORAL_TABLET | Freq: Two times a day (BID) | ORAL | 3 refills | Status: DC

## 2022-12-24 NOTE — Assessment & Plan Note (Signed)
Acute Has felt some palpitations and irregular heart rhythm when she is felt her pulse History of PVCs in the past EKG today NSR at 74 bpm, normal EKG.  No previous EKG on file for comparison. She will decrease caffeine, get BP better controlled and monitor If palpitations persist we will pursue Holter monitor No family history of CAD, could consider CT coronary calcium score at some point, but she is likely low risk Blood work done not long ago was all normal-no need to repeat at this time

## 2022-12-24 NOTE — Assessment & Plan Note (Signed)
Chronic Not ideally controlled Increase losartan from 50 mg daily to 50 mg twice daily-she tolerates that twice a day dosing better than 100 mg once a day She will continue regular exercise and work on weight loss If blood pressure is not controlled she will let me know

## 2023-01-02 ENCOUNTER — Encounter: Payer: Self-pay | Admitting: Internal Medicine

## 2023-01-24 NOTE — Progress Notes (Signed)
Subjective:    Patient ID: Emily Foley, female    DOB: August 17, 1956, 67 y.o.   MRN: [id number]      HPI Emily Foley is here for  Chief Complaint  Patient presents with   Cough    Still cough with mucus (hard to get up) Night time cough is worse; Fatigued     Persistent cold symptoms -  had covid the end of march  - went to UC - finished paxlovid.  Kept getting worse - sinuses swollen, coughed up discolored sputum, nasal congestion.  Covid test neg at home.   Went back to UC - sinus and eye infections.   Took augmentin, prednisone and cough syrup and felt better.  Her symptoms have persisted and seem to be getting a little worse.        Still has cough, ST, nasal congestion.  Two days ago started coughing up discolored sputum again.  Persistent ST .    Taking zyrtec daily.  Taking tessalon perles, atrovent nasal spray. Uses promethazine cough syrup at night.     Medications and allergies reviewed with patient and updated if appropriate.  Current Outpatient Medications on File Prior to Visit  Medication Sig Dispense Refill   celecoxib (CELEBREX) 200 MG capsule Take 1 capsule (200 mg total) by mouth 2 (two) times daily as needed. 180 capsule 3   ciprofloxacin-dexamethasone (CIPRODEX) OTIC suspension Place 4 drops into both ears 2 (two) times daily as needed. 7.5 mL 5   folic acid (FOLVITE) 800 MCG tablet Take 800 mcg by mouth daily.      losartan (COZAAR) 50 MG tablet Take 1 tablet (50 mg total) by mouth in the morning and at bedtime. 180 tablet 3   pantoprazole (PROTONIX) 40 MG tablet Take 1 tablet (40 mg total) by mouth daily. 90 tablet 1   valACYclovir (VALTREX) 1000 MG tablet Take 1 tablet (1,000 mg total) by mouth 2 (two) times daily. 20 tablet 0   vitamin B-12 (CYANOCOBALAMIN) 1000 MCG tablet Take 1,000 mcg by mouth daily.     No current facility-administered medications on file prior to visit.    Review of Systems  Constitutional:  Positive for fatigue. Negative for  fever.  HENT:  Positive for congestion, postnasal drip and sore throat. Negative for sinus pain.   Respiratory:  Positive for cough and shortness of breath. Negative for wheezing.   Neurological:  Positive for headaches.       Objective:   Vitals:   01/25/23 1028  BP: (!) 142/78  Pulse: 70  Temp: 98.8 F (37.1 C)  SpO2: 98%   BP Readings from Last 3 Encounters:  01/25/23 (!) 142/78  12/24/22 138/80  11/09/22 (!) 147/78   Wt Readings from Last 3 Encounters:  01/25/23 143 lb (64.9 kg)  12/24/22 146 lb (66.2 kg)  11/09/22 147 lb 4.8 oz (66.8 kg)   Body mass index is 26.16 kg/m.    Physical Exam Constitutional:      General: She is not in acute distress.    Appearance: Normal appearance.  HENT:     Head: Normocephalic and atraumatic.  Eyes:     Conjunctiva/sclera: Conjunctivae normal.  Cardiovascular:     Rate and Rhythm: Normal rate and regular rhythm.     Heart sounds: Normal heart sounds.  Pulmonary:     Effort: Pulmonary effort is normal. No respiratory distress.     Breath sounds: Normal breath sounds. No wheezing.  Musculoskeletal:     Cervical  back: Neck supple.     Right lower leg: No edema.     Left lower leg: No edema.  Lymphadenopathy:     Cervical: No cervical adenopathy.  Skin:    General: Skin is warm and dry.     Findings: No rash.  Neurological:     Mental Status: She is alert. Mental status is at baseline.  Psychiatric:        Mood and Affect: Mood normal.        Behavior: Behavior normal.            Assessment & Plan:    See Problem List for Assessment and Plan of chronic medical problems.

## 2023-01-25 ENCOUNTER — Ambulatory Visit (INDEPENDENT_AMBULATORY_CARE_PROVIDER_SITE_OTHER): Payer: Medicare Other | Admitting: Internal Medicine

## 2023-01-25 ENCOUNTER — Encounter: Payer: Self-pay | Admitting: Internal Medicine

## 2023-01-25 ENCOUNTER — Ambulatory Visit (INDEPENDENT_AMBULATORY_CARE_PROVIDER_SITE_OTHER): Payer: Medicare Other

## 2023-01-25 VITALS — BP 142/78 | HR 70 | Temp 98.8°F | Ht 62.0 in | Wt 143.0 lb

## 2023-01-25 DIAGNOSIS — J019 Acute sinusitis, unspecified: Secondary | ICD-10-CM

## 2023-01-25 DIAGNOSIS — I10 Essential (primary) hypertension: Secondary | ICD-10-CM | POA: Diagnosis not present

## 2023-01-25 DIAGNOSIS — R052 Subacute cough: Secondary | ICD-10-CM

## 2023-01-25 DIAGNOSIS — R0602 Shortness of breath: Secondary | ICD-10-CM

## 2023-01-25 DIAGNOSIS — J329 Chronic sinusitis, unspecified: Secondary | ICD-10-CM | POA: Insufficient documentation

## 2023-01-25 MED ORDER — CEFDINIR 300 MG PO CAPS: 300.0000 mg | ORAL_CAPSULE | Freq: Two times a day (BID) | ORAL | 0 refills | Status: AC

## 2023-01-25 MED ORDER — BENZONATATE 200 MG PO CAPS: 200.0000 mg | ORAL_CAPSULE | Freq: Three times a day (TID) | ORAL | 1 refills | Status: DC | PRN

## 2023-01-25 NOTE — Assessment & Plan Note (Signed)
Subacute Partially treated Still with persistent symptoms CXR today - I do not think she has PNA but need to confirm Start omnicef 300 mg bid x 7 days Tesslaon perles Continue promethazine cough syrup at night, zyrtec, otc meds Increase water intake She will let me know if she is not improving -- may need to add in prednisone in a few days if not improving

## 2023-01-25 NOTE — Assessment & Plan Note (Signed)
Chronic BP slightly elevated here today - likely related to her being sick No change in medication today She will monitor BP at home Continue losartan 50 mg bid

## 2023-01-25 NOTE — Patient Instructions (Addendum)
      Have a chest xray downstairs.     Medications changes include :   omnicef twice daily x 7 days, tessalon perles.      Return if symptoms worsen or fail to improve.

## 2023-04-03 ENCOUNTER — Telehealth: Payer: Self-pay | Admitting: Hematology

## 2023-04-09 ENCOUNTER — Encounter: Payer: Self-pay | Admitting: Internal Medicine

## 2023-04-09 ENCOUNTER — Ambulatory Visit (INDEPENDENT_AMBULATORY_CARE_PROVIDER_SITE_OTHER): Payer: Medicare Other | Admitting: Internal Medicine

## 2023-04-09 VITALS — BP 136/72 | HR 89 | Temp 99.0°F | Ht 62.0 in | Wt 139.0 lb

## 2023-04-09 DIAGNOSIS — J028 Acute pharyngitis due to other specified organisms: Secondary | ICD-10-CM

## 2023-04-09 DIAGNOSIS — B9689 Other specified bacterial agents as the cause of diseases classified elsewhere: Secondary | ICD-10-CM | POA: Diagnosis not present

## 2023-04-09 DIAGNOSIS — J029 Acute pharyngitis, unspecified: Secondary | ICD-10-CM

## 2023-04-09 DIAGNOSIS — J069 Acute upper respiratory infection, unspecified: Secondary | ICD-10-CM

## 2023-04-09 LAB — POCT RAPID STREP A (OFFICE): Rapid Strep A Screen: NEGATIVE

## 2023-04-09 MED ORDER — AMOXICILLIN 500 MG PO CAPS: 500.0000 mg | ORAL_CAPSULE | Freq: Three times a day (TID) | ORAL | 0 refills | Status: AC

## 2023-04-09 NOTE — Assessment & Plan Note (Signed)
Acute Some of her symptoms are improving, but her sore throat has been persistent concerning for bacterial cause Will be starting amoxicillin 500 mg 3 times daily x 10 days Continue symptomatic treatment

## 2023-04-09 NOTE — Assessment & Plan Note (Signed)
Acute Symptoms consistent with bacterial pharyngitis Rapid strep is negative, but symptoms concerning for bacterial infection that likely requires an antibiotic Start amoxicillin 500 mg 3 times daily x 10 days Continue symptomatic treatment Call if no improvement

## 2023-04-09 NOTE — Progress Notes (Signed)
Subjective:    Patient ID: Emily Foley, female    DOB: [birth year], 67 y.o.   MRN: [id number]      HPI Emily Foley is here for  Chief Complaint  Patient presents with   Sore Throat    Symptoms started 2 weeks ago with sore throat and congestion; Throat feels like razor blades when she swallows. Congestion has subsided. Past 6 days right side of throat has been swollen    She is here for an acute visit for cold symptoms.   Her symptoms started 2 weeks  She is experiencing she has been experiencing low-grade fever, Nasal congestion that was initially very discolored and is now still slightly discolored, postnasal drainage, sore throat that has been persistent, hoarseness, cough that is both dry and productive and headaches.  Her sore throat has been persistent since it started and her symptoms are more severe now than when it first started.  The soreness is especially on the right side.  She is swollen lymph nodes on the right side.  She has tried taking NyQuil, over-the-counter cold medications, over-the-counter pain medications     Medications and allergies reviewed with patient and updated if appropriate.  Current Outpatient Medications on File Prior to Visit  Medication Sig Dispense Refill   benzonatate (TESSALON) 200 MG capsule Take 1 capsule (200 mg total) by mouth 3 (three) times daily as needed for cough. 30 capsule 1   celecoxib (CELEBREX) 200 MG capsule Take 1 capsule (200 mg total) by mouth 2 (two) times daily as needed. 180 capsule 3   ciprofloxacin-dexamethasone (CIPRODEX) OTIC suspension Place 4 drops into both ears 2 (two) times daily as needed. 7.5 mL 5   folic acid (FOLVITE) 800 MCG tablet Take 800 mcg by mouth daily.      losartan (COZAAR) 50 MG tablet Take 1 tablet (50 mg total) by mouth in the morning and at bedtime. 180 tablet 3   pantoprazole (PROTONIX) 40 MG tablet Take 1 tablet (40 mg total) by mouth daily. 90 tablet 1   valACYclovir (VALTREX) 1000 MG  tablet Take 1 tablet (1,000 mg total) by mouth 2 (two) times daily. 20 tablet 0   vitamin B-12 (CYANOCOBALAMIN) 1000 MCG tablet Take 1,000 mcg by mouth daily.     No current facility-administered medications on file prior to visit.    Review of Systems  Constitutional:  Positive for fever (lower grade).  HENT:  Positive for congestion (discolored mucus), postnasal drip, sore throat (persistent - right side is worse) and voice change. Negative for ear pain, sinus pressure and sinus pain.        Tender LN on right side of neck  Respiratory:  Positive for cough (dry and productive). Negative for shortness of breath and wheezing.   Neurological:  Positive for headaches.       Objective:   Vitals:   04/09/23 1412  BP: 136/72  Pulse: 89  Temp: 99 F (37.2 C)  SpO2: 97%   BP Readings from Last 3 Encounters:  04/09/23 136/72  01/25/23 (!) 142/78  12/24/22 138/80   Wt Readings from Last 3 Encounters:  04/09/23 139 lb (63 kg)  01/25/23 143 lb (64.9 kg)  12/24/22 146 lb (66.2 kg)   Body mass index is 25.42 kg/m.    Physical Exam Constitutional:      General: She is not in acute distress.    Appearance: Normal appearance. She is not ill-appearing.  HENT:     Head: Normocephalic and  atraumatic.     Right Ear: Tympanic membrane, ear canal and external ear normal.     Left Ear: Tympanic membrane, ear canal and external ear normal.     Mouth/Throat:     Mouth: Mucous membranes are moist.     Pharynx: Posterior oropharyngeal erythema (Right side>> left side) present. No oropharyngeal exudate.  Eyes:     Conjunctiva/sclera: Conjunctivae normal.  Cardiovascular:     Rate and Rhythm: Normal rate and regular rhythm.  Pulmonary:     Effort: Pulmonary effort is normal. No respiratory distress.     Breath sounds: Normal breath sounds. No wheezing or rales.  Musculoskeletal:     Cervical back: Neck supple. No tenderness.  Lymphadenopathy:     Cervical: Cervical adenopathy (Tender  on right side) present.  Skin:    General: Skin is warm and dry.  Neurological:     Mental Status: She is alert.        Rapid strep negative    Assessment & Plan:    See Problem List for Assessment and Plan of chronic medical problems.

## 2023-04-09 NOTE — Patient Instructions (Addendum)
        Medications changes include :   amoxicillin TID x 10 days      Return if symptoms worsen or fail to improve.

## 2023-05-09 ENCOUNTER — Encounter: Payer: Self-pay | Admitting: Internal Medicine

## 2023-05-10 ENCOUNTER — Ambulatory Visit: Payer: Medicare Other | Admitting: Internal Medicine

## 2023-05-10 ENCOUNTER — Encounter: Payer: Self-pay | Admitting: Internal Medicine

## 2023-05-10 ENCOUNTER — Ambulatory Visit: Payer: Commercial Managed Care - PPO | Admitting: Hematology

## 2023-05-10 ENCOUNTER — Other Ambulatory Visit: Payer: Commercial Managed Care - PPO

## 2023-05-10 VITALS — BP 160/100 | HR 83 | Temp 98.6°F | Ht 62.0 in | Wt 145.0 lb

## 2023-05-10 DIAGNOSIS — R3915 Urgency of urination: Secondary | ICD-10-CM | POA: Diagnosis not present

## 2023-05-10 LAB — POCT URINALYSIS DIPSTICK
Bilirubin, UA: NEGATIVE
Blood, UA: NEGATIVE
Glucose, UA: NEGATIVE
Ketones, UA: NEGATIVE
Nitrite, UA: NEGATIVE
Protein, UA: NEGATIVE
Spec Grav, UA: 1.01 (ref 1.010–1.025)
Urobilinogen, UA: NEGATIVE E.U./dL — AB
pH, UA: 6 (ref 5.0–8.0)

## 2023-05-10 MED ORDER — NITROFURANTOIN MONOHYD MACRO 100 MG PO CAPS: 100.0000 mg | ORAL_CAPSULE | Freq: Two times a day (BID) | ORAL | 0 refills | Status: AC

## 2023-05-10 NOTE — Progress Notes (Signed)
   Subjective:   Patient ID: Emily Foley, female    DOB: 30-Dec-1955, 67 y.o.   MRN: [id number]  Urinary Tract Infection  Associated symptoms include frequency. Pertinent negatives include no nausea, urgency or vomiting.     Review of Systems  Constitutional: Negative.   Respiratory: Negative.    Cardiovascular: Negative.   Gastrointestinal:  Negative for abdominal distention, abdominal pain, constipation, diarrhea, nausea and vomiting.  Genitourinary:  Positive for dysuria and frequency. Negative for urgency.  Musculoskeletal: Negative.   Skin: Negative.     Objective:  Physical Exam Constitutional:      Appearance: She is well-developed.  HENT:     Head: Normocephalic and atraumatic.  Cardiovascular:     Rate and Rhythm: Normal rate and regular rhythm.  Pulmonary:     Effort: Pulmonary effort is normal. No respiratory distress.     Breath sounds: Normal breath sounds. No wheezing or rales.  Abdominal:     General: Bowel sounds are normal. There is no distension.     Palpations: Abdomen is soft.     Tenderness: There is no abdominal tenderness. There is no rebound.  Musculoskeletal:     Cervical back: Normal range of motion.  Skin:    General: Skin is warm and dry.  Neurological:     Mental Status: She is alert and oriented to person, place, and time.     Coordination: Coordination normal.     Vitals:   05/10/23 1100 05/10/23 1109  BP: (!) 160/100 (!) 160/100  Pulse: 83   Temp: 98.6 F (37 C)   TempSrc: Oral   SpO2: 96%   Weight: 145 lb (65.8 kg)   Height: 5\' 2"  (1.575 m)     Assessment & Plan:

## 2023-05-10 NOTE — Assessment & Plan Note (Signed)
POC U/A done and consistent with infection. Rx macrobid 5 day course.

## 2023-05-10 NOTE — Patient Instructions (Signed)
We have sent in macrobid to take 1 pill twice a day for 5 days. 

## 2023-05-21 ENCOUNTER — Other Ambulatory Visit: Payer: Self-pay

## 2023-05-21 DIAGNOSIS — C8307 Small cell B-cell lymphoma, spleen: Secondary | ICD-10-CM

## 2023-05-22 ENCOUNTER — Inpatient Hospital Stay: Payer: Medicare Other | Attending: Hematology

## 2023-05-22 ENCOUNTER — Other Ambulatory Visit: Payer: Self-pay

## 2023-05-22 ENCOUNTER — Inpatient Hospital Stay (HOSPITAL_BASED_OUTPATIENT_CLINIC_OR_DEPARTMENT_OTHER): Payer: Medicare Other | Admitting: Hematology

## 2023-05-22 VITALS — BP 147/65 | HR 63 | Temp 98.6°F | Resp 18 | Wt 135.0 lb

## 2023-05-22 DIAGNOSIS — C8307 Small cell B-cell lymphoma, spleen: Secondary | ICD-10-CM

## 2023-05-22 DIAGNOSIS — D649 Anemia, unspecified: Secondary | ICD-10-CM | POA: Diagnosis not present

## 2023-05-22 DIAGNOSIS — Z8572 Personal history of non-Hodgkin lymphomas: Secondary | ICD-10-CM | POA: Insufficient documentation

## 2023-05-22 LAB — CBC WITH DIFFERENTIAL (CANCER CENTER ONLY)
Abs Immature Granulocytes: 0.01 10*3/uL (ref 0.00–0.07)
Basophils Absolute: 0.1 10*3/uL (ref 0.0–0.1)
Basophils Relative: 1 %
Eosinophils Absolute: 0.2 10*3/uL (ref 0.0–0.5)
Eosinophils Relative: 3 %
HCT: 43 % (ref 36.0–46.0)
Hemoglobin: 15.1 g/dL — ABNORMAL HIGH (ref 12.0–15.0)
Immature Granulocytes: 0 %
Lymphocytes Relative: 28 %
Lymphs Abs: 1.8 10*3/uL (ref 0.7–4.0)
MCH: 30.2 pg (ref 26.0–34.0)
MCHC: 35.1 g/dL (ref 30.0–36.0)
MCV: 86 fL (ref 80.0–100.0)
Monocytes Absolute: 0.5 10*3/uL (ref 0.1–1.0)
Monocytes Relative: 8 %
Neutro Abs: 3.9 10*3/uL (ref 1.7–7.7)
Neutrophils Relative %: 60 %
Platelet Count: 227 10*3/uL (ref 150–400)
RBC: 5 MIL/uL (ref 3.87–5.11)
RDW: 12.6 % (ref 11.5–15.5)
WBC Count: 6.5 10*3/uL (ref 4.0–10.5)
nRBC: 0 % (ref 0.0–0.2)

## 2023-05-22 LAB — CMP (CANCER CENTER ONLY)
ALT: 29 U/L (ref 0–44)
AST: 23 U/L (ref 15–41)
Albumin: 4.7 g/dL (ref 3.5–5.0)
Alkaline Phosphatase: 71 U/L (ref 38–126)
Anion gap: 7 (ref 5–15)
BUN: 19 mg/dL (ref 8–23)
CO2: 27 mmol/L (ref 22–32)
Calcium: 9.6 mg/dL (ref 8.9–10.3)
Chloride: 105 mmol/L (ref 98–111)
Creatinine: 0.72 mg/dL (ref 0.44–1.00)
GFR, Estimated: >60 mL/min (ref >60)
Glucose, Bld: 95 mg/dL (ref 70–99)
Potassium: 4.1 mmol/L (ref 3.5–5.1)
Sodium: 139 mmol/L (ref 135–145)
Total Bilirubin: 1 mg/dL (ref 0.3–1.2)
Total Protein: 6.7 g/dL (ref 6.5–8.1)

## 2023-05-22 LAB — LACTATE DEHYDROGENASE: LDH: 215 U/L — ABNORMAL HIGH (ref 98–192)

## 2023-05-28 NOTE — Progress Notes (Signed)
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 is here for continued evaluation and management of her splenic marginal zone lymphoma. She notes no acute new symptoms. No fevers no chills no night sweats.  No new lumps or bumps. No new abdominal pain or distention. No new joint pain or swelling. Notes some weight gain due to dietary indiscretions which she has now corrected and has started to lose the weight in the planned fashion.   Past Medical History:  Diagnosis Date   Arthritis    Cancer (HCC)    Hypertension     SURGICAL HISTORY: No past surgical history on file.  SOCIAL HISTORY: Social History   Socioeconomic History   Marital status: Married    Spouse name: Not on file   Number of children: Not on file   Years of education: Not on file   Highest education level: Bachelor's degree (e.g., BA, AB, BS)  Occupational History   Not on file  Tobacco Use   Smoking status: Never   Smokeless tobacco: Never  Vaping Use   Vaping status: Never Used  Substance and Sexual Activity   Alcohol use: Not Currently    Alcohol/week: 0.0 standard drinks of alcohol   Drug use: Never   Sexual activity: Not on file  Other Topics Concern   Not on file  Social History Narrative   Not on file   Social Determinants of Health   Financial Resource Strain: Low Risk  (01/24/2023)   Overall Financial Resource Strain (CARDIA)    Difficulty of Paying Living Expenses: Not hard at all  Food Insecurity: No Food Insecurity (01/24/2023)   Hunger Vital Sign    Worried About Running Out of Food in the Last Year: Never true    Ran Out of Food  in the Last Year: Never true  Transportation Needs: No Transportation Needs (01/24/2023)   PRAPARE - Administrator, Civil Service (Medical): No    Lack of Transportation (Non-Medical): No  Physical Activity: Sufficiently Active (01/24/2023)   Exercise Vital Sign    Days of Exercise per Week: 5 days    Minutes of Exercise per Session: 40 min  Stress: No Stress Concern Present (01/24/2023)   [employer] of Occupational Health - Occupational Stress Questionnaire    Feeling of Stress : Only a little  Social Connections: Moderately Integrated (01/24/2023)   Social Connection and Isolation Panel [NHANES]    Frequency of Communication with Friends and Family: More than three times a week    Frequency of Social Gatherings with Friends and Family: More than three times a week    Attends Religious Services: More than 4 times per year    Active Member of [employer] or Organizations: No    Attends Engineer, structural: Not on file    Marital Status: Married  Catering manager Violence: Not on file    FAMILY HISTORY: Family History  Problem Relation Age of Onset   Diabetes Mother    Hyperlipidemia Mother    Hypertension Mother    Rheum arthritis Brother    Diabetes Brother     ALLERGIES:  is allergic to biaxin [clarithromycin] and doxycycline.  MEDICATIONS:  Current Outpatient Medications  Medication Sig Dispense Refill   benzonatate (TESSALON) 200 MG capsule Take 1 capsule (200 mg total) by mouth 3 (three) times daily as needed for cough. 30 capsule 1   celecoxib (CELEBREX) 200 MG capsule Take 1 capsule (200 mg total) by mouth 2 (two) times daily as needed. 180 capsule 3   ciprofloxacin-dexamethasone (CIPRODEX) OTIC suspension Place 4 drops into both ears 2 (two) times daily as needed. 7.5 mL 5   folic acid (FOLVITE) 800 MCG tablet Take 800 mcg by mouth daily.      losartan (COZAAR) 50 MG tablet Take 1 tablet (50 mg total) by mouth in the morning and at bedtime. 180  tablet 3   pantoprazole (PROTONIX) 40 MG tablet Take 1 tablet (40 mg total) by mouth daily. 90 tablet 1   valACYclovir (VALTREX) 1000 MG tablet Take 1 tablet (1,000 mg total) by mouth 2 (two) times daily. 20 tablet 0   vitamin B-12 (CYANOCOBALAMIN) 1000 MCG tablet Take 1,000 mcg by mouth daily.     No current facility-administered medications for this visit.    REVIEW OF SYSTEMS:    .10 Point review of Systems was done is negative except as noted above.  PHYSICAL EXAMINATION: ECOG FS:1 - Symptomatic but completely ambulatory  Vitals:   05/22/23 1520  BP: (!) 147/65  Pulse: 63  Resp: 18  Temp: 98.6 F (37 C)  SpO2: 100%     Wt Readings from Last 3 Encounters:  05/22/23 135 lb (61.2 kg)  05/10/23 145 lb (65.8 kg)  04/09/23 139 lb (63 kg)   Body mass index is 24.69 kg/m.    Marland Kitchen GENERAL:alert, in no acute distress and comfortable EYES: conjunctiva are pink and non-injected, sclera anicteric OROPHARYNX: MMM, no exudates, no oropharyngeal erythema or ulceration NECK: supple, no JVD LYMPH:  no palpable lymphadenopathy in the cervical, axillary or inguinal regions LUNGS: clear to auscultation b/l with normal respiratory effort HEART: regular rate & rhythm ABDOMEN:  normoactive bowel sounds , non tender, not distended.no palpable splenomegaly Extremity: no pedal edema PSYCH: alert & oriented x 3 with fluent speech NEURO: no focal motor/sensory deficits   LABORATORY DATA:  I have reviewed the data as listed  .    Latest Ref Rng & Units 05/22/2023    2:42 PM 11/09/2022    1:25 PM 05/08/2022    2:34 PM  CBC  WBC 4.0 - 10.5 K/uL 6.5  5.4  4.8   Hemoglobin 12.0 - 15.0 g/dL 16.1  09.6  04.5   Hematocrit 36.0 - 46.0 % 43.0  43.5  42.7   Platelets 150 - 400 K/uL 227  229  213    CBC    Component Value Date/Time   WBC 6.5 05/22/2023 1442   WBC 5.5 11/08/2021 1430   RBC 5.00 05/22/2023 1442   HGB 15.1 (H) 05/22/2023 1442   HGB 12.8 09/10/2017 0801   HCT 43.0 05/22/2023  1442   HCT 39.4 09/10/2017 0801   PLT 227 05/22/2023 1442   PLT 144 (L) 09/10/2017 0801   MCV 86.0 05/22/2023 1442   MCV 79.0 (L) 09/10/2017 0801   MCH 30.2 05/22/2023 1442   MCHC 35.1 05/22/2023 1442   RDW 12.6 05/22/2023 1442   RDW 15.6 (H) 09/10/2017 0801   LYMPHSABS 1.8 05/22/2023 1442   LYMPHSABS 0.9 09/10/2017 0801   MONOABS 0.5 05/22/2023 1442   MONOABS 0.4 09/10/2017 0801   EOSABS 0.2 05/22/2023 1442   EOSABS 0.1 09/10/2017 0801  BASOSABS 0.1 05/22/2023 1442   BASOSABS 0.0 09/10/2017 0801    .    Latest Ref Rng & Units 05/22/2023    2:42 PM 11/09/2022    1:25 PM 05/08/2022    2:34 PM  CMP  Glucose 70 - 99 mg/dL 95  95  846   BUN 8 - 23 mg/dL 19  17  15    Creatinine 0.44 - 1.00 mg/dL 9.62  9.52  8.41   Sodium 135 - 145 mmol/L 139  141  140   Potassium 3.5 - 5.1 mmol/L 4.1  4.2  4.1   Chloride 98 - 111 mmol/L 105  108  104   CO2 22 - 32 mmol/L 27  26  31    Calcium 8.9 - 10.3 mg/dL 9.6  9.7  32.4   Total Protein 6.5 - 8.1 g/dL 6.7  6.3  6.7   Total Bilirubin 0.3 - 1.2 mg/dL 1.0  0.7  0.9   Alkaline Phos 38 - 126 U/L 71  77  63   AST 15 - 41 U/L 23  24  25    ALT 0 - 44 U/L 29  45  41    . Lab Results  Component Value Date   LDH 215 (H) 05/22/2023   12/27/17 BM Bx:   12/27/17 Flow Cytometry:     RADIOGRAPHIC STUDIES: I have personally reviewed the radiological images as listed and agreed with the findings in the report. No results found.  ASSESSMENT & PLAN:   67 y.o. female with  1. 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 Patient appeared to be have significant constitutional symptoms . No evidence of rheumatological or infectious etiology for her fevers and night sweats. Constitutional symptoms now resolved.  2. S/p Splenomegaly - likely due to SMZL. Marland Kitchenimproved on [place] abd. Clinical no palpable splenomegaly today.  04/18/18 US Abdomen revealed Splenomegaly with a length of 13.3 cm and a volume of 432 cc. By report, the spleen  measured up to 15.1 cm in maximum dimension on the PET-CT from January 08, 2018 suggesting interval improvement.   3. S/p Anemia - with elevated LDH and low haptoglobin concerning for hemolysis. Coombs neg . Could be IgA driven Coombs neg AIHA. +ve reticulocytosis and Smear with some microspherocytes. No over urine hemosiderinuria. Partly anemia could also be from hypersplenism.    4.  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 Per rheumatology - no evidence of primary rheumatological disorder. No overt evidence of endocarditis.  5. h/o Fevers/chills/night sweat -- likely constitutional symptoms related to ?lymphoma - now resolved.   PLAN:  -Labs on 05/22/2023 were discussed in detail with the patient CBC is stable without any significant cytopenias or lymphocytosis CMP stable LDH borderline elevated at 215. No clinical or lab evidence of splenic marginal zone lymphoma progression at this time. -No indication for additional treatment of the patient's splenic marginal zone lymphoma at this time. -Counseled patient on importance of getting her flu shot and updated new COVID-19 vaccinations and other age-appropriate vaccines.  FOLLOW UP: RTC with Dr Candise Che with labs in 6 months   The total time spent in the appointment was 20 minutes*.  All of the patient's questions were answered with apparent satisfaction. The patient knows to call the clinic with any problems, questions or concerns.   Wyvonnia Lora MD MS AAHIVMS [facility] Hematology/Oncology Physician [facility]  .*Total Encounter Time as defined by the Centers for Medicare and Medicaid Services  includes, in addition to the face-to-face time of a patient visit (documented in the note above) non-face-to-face time: obtaining and reviewing outside history, ordering and reviewing medications, tests or procedures, care coordination (communications with other health care professionals or caregivers)  and documentation in the medical record.

## 2023-06-07 ENCOUNTER — Encounter: Payer: Self-pay | Admitting: Internal Medicine

## 2023-06-07 MED ORDER — CIPROFLOXACIN HCL 250 MG PO TABS: 250.0000 mg | ORAL_TABLET | Freq: Two times a day (BID) | ORAL | 0 refills | Status: AC

## 2023-06-17 ENCOUNTER — Telehealth: Payer: Self-pay | Admitting: Hematology

## 2023-06-17 NOTE — Telephone Encounter (Signed)
Patient is aware of scheduled appointment times/dates

## 2023-07-01 ENCOUNTER — Encounter: Payer: Self-pay | Admitting: Internal Medicine

## 2023-07-01 ENCOUNTER — Encounter: Payer: Self-pay | Admitting: Hematology

## 2023-07-16 LAB — HM MAMMOGRAPHY

## 2023-07-18 ENCOUNTER — Encounter: Payer: Self-pay | Admitting: Internal Medicine

## 2023-10-10 ENCOUNTER — Ambulatory Visit (INDEPENDENT_AMBULATORY_CARE_PROVIDER_SITE_OTHER): Payer: Medicare Other

## 2023-10-10 VITALS — Ht 62.0 in | Wt 135.0 lb

## 2023-10-10 DIAGNOSIS — Z0000 Encounter for general adult medical examination without abnormal findings: Secondary | ICD-10-CM

## 2023-10-10 NOTE — Progress Notes (Signed)
 Subjective:   Emily Foley is a 68 y.o. female who presents for an Initial Medicare Annual Wellness Visit.  Visit Complete: Virtual I connected with  Emily Foley Getting on 10/10/23 by a audio enabled telemedicine application and verified that I am speaking with the correct person using two identifiers.  Patient Location: Home  Provider Location: Office/Clinic  I discussed the limitations of evaluation and management by telemedicine. The patient expressed understanding and agreed to proceed.  Vital Signs: Because this visit was a virtual/telehealth visit, some criteria may be missing or patient reported. Any vitals not documented were not able to be obtained and vitals that have been documented are patient reported.  Patient Medicare AWV questionnaire was completed by the patient on 10/09/2023; I have confirmed that all information answered by patient is correct and no changes since this date.  Cardiac Risk Factors include: advanced age (>35men, >6 women);hypertension     Objective:    Today's Vitals   10/09/23 2050 10/10/23 0810  Weight:  135 lb (61.2 kg)  Height:  5' 2 (1.575 m)  PainSc: 5     Body mass index is 24.69 kg/m.     10/10/2023    8:17 AM 09/08/2020   10:55 AM 06/16/2020   10:20 AM 02/19/2020    9:21 AM 12/21/2019    9:38 AM 10/23/2019    8:52 AM 11/10/2018    9:04 AM  Advanced Directives  Does Patient Have a Medical Advance Directive? Yes Yes Yes Yes Yes Yes Yes  Type of Estate Agent of [place];Living will Healthcare Power of [place];Living will Healthcare Power of [place];Living will Healthcare Power of [place];Living will Healthcare Power of [place];Living will    Does patient want to make changes to medical advance directive?    No - Patient declined No - Patient declined  No - Patient declined  Copy of Healthcare Power of Attorney in Chart? No - copy requested No - copy requested No - copy requested No - copy requested No - copy requested     Would patient like information on creating a medical advance directive?  No - Patient declined No - Patient declined No - Patient declined       Current Medications (verified) Outpatient Encounter Medications as of 10/10/2023  Medication Sig   celecoxib  (CELEBREX ) 200 MG capsule Take 1 capsule (200 mg total) by mouth 2 (two) times daily as needed.   ciprofloxacin -dexamethasone  (CIPRODEX ) OTIC suspension Place 4 drops into both ears 2 (two) times daily as needed.   folic acid (FOLVITE) 800 MCG tablet Take 800 mcg by mouth daily.    losartan  (COZAAR ) 50 MG tablet Take 1 tablet (50 mg total) by mouth in the morning and at bedtime.   pantoprazole  (PROTONIX ) 40 MG tablet Take 1 tablet (40 mg total) by mouth daily.   valACYclovir  (VALTREX ) 1000 MG tablet Take 1 tablet (1,000 mg total) by mouth 2 (two) times daily.   vitamin B-12 (CYANOCOBALAMIN) 1000 MCG tablet Take 1,000 mcg by mouth daily.   benzonatate  (TESSALON ) 200 MG capsule Take 1 capsule (200 mg total) by mouth 3 (three) times daily as needed for cough. (Patient not taking: Reported on 10/10/2023)   No facility-administered encounter medications on file as of 10/10/2023.    Allergies (verified) Biaxin [clarithromycin] and Doxycycline    History: Past Medical History:  Diagnosis Date   Arthritis    Cancer (HCC)    Hypertension    History reviewed. No pertinent surgical history. Family History  Problem Relation Age of Onset   Diabetes Mother    Hyperlipidemia Mother    Hypertension Mother    Rheum arthritis Brother    Diabetes Brother    Social History   Socioeconomic History   Marital status: Married    Spouse name: Romero   Number of children: Not on file   Years of education: Not on file   Highest education level: Bachelor's degree (e.g., BA, AB, BS)  Occupational History   Not on file  Tobacco Use   Smoking status: Never   Smokeless tobacco: Never  Vaping Use   Vaping status: Never Used  Substance and Sexual  Activity   Alcohol use: Not Currently    Alcohol/week: 0.0 standard drinks of alcohol   Drug use: Never   Sexual activity: Not on file  Other Topics Concern   Not on file  Social History Narrative   Lives with spouse.  Has one dog.   Social Drivers of Corporate Investment Banker Strain: Low Risk  (10/10/2023)   Overall Financial Resource Strain (CARDIA)    Difficulty of Paying Living Expenses: Not hard at all  Food Insecurity: No Food Insecurity (10/10/2023)   Hunger Vital Sign    Worried About Running Out of Food in the Last Year: Never true    Ran Out of Food in the Last Year: Never true  Transportation Needs: No Transportation Needs (10/10/2023)   PRAPARE - Administrator, Civil Service (Medical): No    Lack of Transportation (Non-Medical): No  Physical Activity: Sufficiently Active (10/10/2023)   Exercise Vital Sign    Days of Exercise per Week: 7 days    Minutes of Exercise per Session: 30 min  Stress: Stress Concern Present (10/10/2023)   [employer] of Occupational Health - Occupational Stress Questionnaire    Feeling of Stress : To some extent  Social Connections: Moderately Integrated (10/10/2023)   Social Connection and Isolation Panel [NHANES]    Frequency of Communication with Friends and Family: More than three times a week    Frequency of Social Gatherings with Friends and Family: More than three times a week    Attends Religious Services: 1 to 4 times per year    Active Member of [employer] or Organizations: No    Attends Engineer, Structural: Never    Marital Status: Living with partner    Tobacco Counseling Counseling given: Not Answered   Clinical Intake:  Pre-visit preparation completed: Yes  Pain : 0-10 Pain Score: 5  Pain Type: Chronic pain Pain Location: Knee (Having surgery in Feb) Pain Orientation: Right Pain Onset: More than a month ago Pain Frequency: Constant Pain Relieving Factors: Tylenol , CelABRAX  Pain Relieving Factors:  Tylenol , CelABRAX  BMI - recorded: 24.69 Nutritional Status: BMI of 19-24  Normal Nutritional Risks: None Diabetes: No  How often do you need to have someone help you when you read instructions, pamphlets, or other written materials from your doctor or pharmacy?: 1 - Never  Interpreter Needed?: No  Information entered by :: Tyra Gural, RMA   Activities of Daily Living    10/09/2023    8:50 PM  In your present state of health, do you have any difficulty performing the following activities:  Hearing? 0  Vision? 0  Difficulty concentrating or making decisions? 0  Walking or climbing stairs? 1  Dressing or bathing? 0  Doing errands, shopping? 0  Preparing Food and eating ? N  Using the Toilet? N  In the past six months, have you accidently leaked urine? N  Do you have problems with loss of bowel control? N  Managing your Medications? N  Managing your Finances? N  Housekeeping or managing your Housekeeping? N    Patient Care Team: Geofm Glade PARAS, MD as PCP - General (Internal Medicine)  Indicate any recent Medical Services you may have received from other than [facility] providers in the past year (date may be approximate).     Assessment:   This is a routine wellness examination for Alga.  Hearing/Vision screen Hearing Screening - Comments:: Denies hearing difficulties   Vision Screening - Comments:: Denies vision issues.    Goals Addressed               This Visit's Progress     Patient Stated (pt-stated)        Lose weight      Depression Screen    10/10/2023    8:23 AM 01/25/2023   10:52 AM 12/24/2022    8:20 AM 08/22/2022    4:07 PM 01/24/2022   11:42 AM 04/07/2021    1:08 PM 01/05/2021    3:17 PM  PHQ 2/9 Scores  PHQ - 2 Score 3 0 0 0 0 0 0  PHQ- 9 Score 9 3 3         Fall Risk    10/09/2023    8:50 PM 05/10/2023   11:00 AM 01/25/2023   10:52 AM 12/24/2022    8:20 AM 08/22/2022    4:07 PM  Fall Risk   Falls in the past year? 0 0 0 0 0  Number falls  in past yr:  0 0 0 0  Injury with Fall?  0 0 0 0  Risk for fall due to : No Fall Risks  No Fall Risks No Fall Risks No Fall Risks  Follow up Falls evaluation completed;Falls prevention discussed Falls evaluation completed Falls evaluation completed Falls evaluation completed Falls evaluation completed    MEDICARE RISK AT HOME: Medicare Risk at Home Any stairs in or around the home?: (Patient-Rptd) No If so, are there any without handrails?: (Patient-Rptd) No Home free of loose throw rugs in walkways, pet beds, electrical cords, etc?: (Patient-Rptd) Yes Adequate lighting in your home to reduce risk of falls?: (Patient-Rptd) Yes Life alert?: (Patient-Rptd) No Use of a cane, walker or w/c?: (Patient-Rptd) No Grab bars in the bathroom?: (Patient-Rptd) No Shower chair or bench in shower?: (Patient-Rptd) No Elevated toilet seat or a handicapped toilet?: (Patient-Rptd) No  TIMED UP AND GO:  Was the test performed? No    Cognitive Function:        10/10/2023    8:18 AM  6CIT Screen  What Year? 0 points  What month? 0 points  What time? 0 points  Count back from 20 0 points  Months in reverse 0 points  Repeat phrase 0 points  Total Score 0 points    Immunizations Immunization History  Administered Date(s) Administered   Fluad Quad(high Dose 65+) 07/12/2023   Influenza, High Dose Seasonal PF 07/18/2022   Influenza,inj,Quad PF,6+ Mos 07/27/2019   Influenza-Unspecified 07/05/2017   Pfizer Covid-19 Vaccine Bivalent Booster 32yrs & up 06/29/2021   Pfizer(Comirnaty)Fall Seasonal Vaccine 12 years and older 07/09/2023    TDAP status: Due, Education has been provided regarding the importance of this vaccine. Advised may receive this vaccine at local pharmacy or Health Dept. Aware to provide a copy of the vaccination record if obtained from local pharmacy or  Health Dept. Verbalized acceptance and understanding.  Flu Vaccine status: Up to date  Pneumococcal vaccine status: Due,  Education has been provided regarding the importance of this vaccine. Advised may receive this vaccine at local pharmacy or Health Dept. Aware to provide a copy of the vaccination record if obtained from local pharmacy or Health Dept. Verbalized acceptance and understanding.  Covid-19 vaccine status: Completed vaccines  Qualifies for Shingles Vaccine? Yes   Zostavax completed Yes   Shingrix Completed?: No.    Education has been provided regarding the importance of this vaccine. Patient has been advised to call insurance company to determine out of pocket expense if they have not yet received this vaccine. Advised may also receive vaccine at local pharmacy or Health Dept. Verbalized acceptance and understanding.  Screening Tests Health Maintenance  Topic Date Due   Pneumonia Vaccine 18+ Years old (1 of 2 - PCV) Never done   DEXA SCAN  Never done   COVID-19 Vaccine (3 - Pfizer risk series) 08/06/2023   DTaP/Tdap/Td (1 - Tdap) 01/06/2024 (Originally 08/15/1975)   Zoster Vaccines- Shingrix (1 of 2) 02/05/2024 (Originally 08/15/1975)   Medicare Annual Wellness (AWV)  10/09/2024   MAMMOGRAM  07/15/2025   Colonoscopy  01/31/2027   INFLUENZA VACCINE  Completed   Hepatitis C Screening  Completed   HPV VACCINES  Aged Out    Health Maintenance  Health Maintenance Due  Topic Date Due   Pneumonia Vaccine 41+ Years old (1 of 2 - PCV) Never done   DEXA SCAN  Never done   COVID-19 Vaccine (3 - Pfizer risk series) 08/06/2023    Colorectal cancer screening: Type of screening: Colonoscopy. Completed 01/31/2027. Repeat every 10 years  Mammogram status: Completed 07/16/2023. Repeat every year   Lung Cancer Screening: (Low Dose CT Chest recommended if Age 56-80 years, 20 pack-year currently smoking OR have quit w/in 15years.) does not qualify.   Lung Cancer Screening Referral: N/A  Additional Screening:  Hepatitis C Screening: does qualify; Completed 01/01/2018  Vision Screening: Recommended  annual ophthalmology exams for early detection of glaucoma and other disorders of the eye. Is the patient up to date with their annual eye exam?  Yes  Who is the provider or what is the name of the office in which the patient attends annual eye exams? Dr. Charmayne If pt is not established with a provider, would they like to be referred to a provider to establish care? No .   Dental Screening: Recommended annual dental exams for proper oral hygiene   Community Resource Referral / Chronic Care Management: CRR required this visit?  Yes   CCM required this visit?  No     Plan:     I have personally reviewed and noted the following in the patient's chart:   Medical and social history Use of alcohol, tobacco or illicit drugs  Current medications and supplements including opioid prescriptions. Patient is not currently taking opioid prescriptions. Functional ability and status Nutritional status Physical activity Advanced directives List of other physicians Hospitalizations, surgeries, and ER visits in previous 12 months Vitals Screenings to include cognitive, depression, and falls Referrals and appointments  In addition, I have reviewed and discussed with patient certain preventive protocols, quality metrics, and best practice recommendations. A written personalized care plan for preventive services as well as general preventive health recommendations were provided to patient.     Shadow Schedler L Tynan Boesel, CMA   10/10/2023   After Visit Summary: (MyChart) Due to this being a telephonic visit,  the after visit summary with patients personalized plan was offered to patient via MyChart   Nurse Notes: Patient is due for a Pneumonia vaccine, a Shingrix vaccine and a Tdap.   Patient stated that she has had a DEXA, however, I do not see a record of it in patient's chart.  She is up to date on all other health maintenance.  Patient had no concerns to address today.

## 2023-10-10 NOTE — Patient Instructions (Signed)
 Emily Foley , Thank you for taking time to come for your Medicare Wellness Visit. I appreciate your ongoing commitment to your health goals. Please review the following plan we discussed and let me know if I can assist you in the future.   Referrals/Orders/Follow-Ups/Clinician Recommendations: You are due for a Tdap, Shingles and Pneumonia vaccines and can get these done at your local pharmacy.  If you have documentation of your Bone density screening, please bring that to your visit with you.  It was nice talking to you.  Keep up the good work.  This is a list of the screening recommended for you and due dates:  Health Maintenance  Topic Date Due   Pneumonia Vaccine (1 of 2 - PCV) Never done   DEXA scan (bone density measurement)  Never done   COVID-19 Vaccine (3 - Pfizer risk series) 08/06/2023   DTaP/Tdap/Td vaccine (1 - Tdap) 01/06/2024*   Zoster (Shingles) Vaccine (1 of 2) 02/05/2024*   Medicare Annual Wellness Visit  10/09/2024   Mammogram  07/15/2025   Colon Cancer Screening  01/31/2027   Flu Shot  Completed   Hepatitis C Screening  Completed   HPV Vaccine  Aged Out  *Topic was postponed. The date shown is not the original due date.    Advanced directives: (Copy Requested) Please bring a copy of your health care power of attorney and living will to the office to be added to your chart at your convenience.  Next Medicare Annual Wellness Visit scheduled for next year: Yes

## 2023-10-14 ENCOUNTER — Encounter: Payer: Self-pay | Admitting: Internal Medicine

## 2023-10-14 NOTE — Progress Notes (Signed)
 Subjective:    Patient ID: Emily Foley, female    DOB: [birth year], 68 y.o.   MRN: [id number]      HPI [place] is here for a Physical exam and her chronic medical problems.   She also needs surgical clearance for TKR 2/26  Occ palps, occ chest pressure - with exertion and cold weather or with anxiety.      Taking a lot of celebrex  for knee pain - to have TKR next month with Dr Melodi.     Medications and allergies reviewed with patient and updated if appropriate.  Current Outpatient Medications on File Prior to Visit  Medication Sig Dispense Refill   celecoxib  (CELEBREX ) 200 MG capsule Take 1 capsule (200 mg total) by mouth 2 (two) times daily as needed. 180 capsule 3   ciprofloxacin -dexamethasone  (CIPRODEX ) OTIC suspension Place 4 drops into both ears 2 (two) times daily as needed. 7.5 mL 5   folic acid (FOLVITE) 800 MCG tablet Take 800 mcg by mouth daily.      losartan  (COZAAR ) 50 MG tablet Take 1 tablet (50 mg total) by mouth in the morning and at bedtime. 180 tablet 3   pantoprazole  (PROTONIX ) 40 MG tablet Take 1 tablet (40 mg total) by mouth daily. 90 tablet 1   valACYclovir  (VALTREX ) 1000 MG tablet Take 1 tablet (1,000 mg total) by mouth 2 (two) times daily. 20 tablet 0   vitamin B-12 (CYANOCOBALAMIN) 1000 MCG tablet Take 1,000 mcg by mouth daily.     No current facility-administered medications on file prior to visit.    Review of Systems  Constitutional:  Negative for fatigue and fever.  Eyes:  Negative for visual disturbance.  Respiratory:  Positive for shortness of breath (occ when walks). Negative for cough and wheezing.   Cardiovascular:  Positive for chest pain (occ chest pressure - when walked dog - it was cold out or anxiety) and palpitations (occ with anxiety). Negative for leg swelling.  Gastrointestinal:  Negative for abdominal pain, blood in stool, constipation and diarrhea.       Occ gerd  - protonix   Genitourinary:  Negative for dysuria.   Musculoskeletal:  Positive for arthralgias (right knee). Negative for back pain.  Skin:  Negative for rash.  Neurological:  Negative for dizziness, light-headedness and headaches.  Psychiatric/Behavioral:  Negative for dysphoric mood. The patient is nervous/anxious.        Objective:   Vitals:   10/15/23 1508  BP: (!) 130/90  Pulse: 68  Temp: 98.6 F (37 C)  SpO2: 97%   Filed Weights   10/15/23 1508  Weight: 139 lb (63 kg)   Body mass index is 25.42 kg/m.  BP Readings from Last 3 Encounters:  10/15/23 (!) 130/90  05/22/23 (!) 147/65  05/10/23 (!) 160/100    Wt Readings from Last 3 Encounters:  10/15/23 139 lb (63 kg)  10/10/23 135 lb (61.2 kg)  05/22/23 135 lb (61.2 kg)       Physical Exam Constitutional: She appears well-developed and well-nourished. No distress.  HENT:  Head: Normocephalic and atraumatic.  Right Ear: External ear normal. Normal ear canal and TM Left Ear: External ear normal.  Normal ear canal and TM Mouth/Throat: Oropharynx is clear and moist.  Eyes: Conjunctivae normal.  Neck: Neck supple. No tracheal deviation present. No thyromegaly present.  No carotid bruit  Cardiovascular: Normal rate, regular rhythm and normal heart sounds.   No murmur heard.  No edema. Pulmonary/Chest: Effort normal and breath sounds  normal. No respiratory distress. She has no wheezes. She has no rales.  Breast: deferred   Abdominal: Soft. She exhibits no distension. There is no tenderness.  Lymphadenopathy: She has no cervical adenopathy.  Skin: Skin is warm and dry. She is not diaphoretic.  Psychiatric: She has a normal mood and affect. Her behavior is normal.     Lab Results  Component Value Date   WBC 6.5 10/15/2023   HGB 14.9 10/15/2023   HCT 44.2 10/15/2023   PLT 259.0 10/15/2023   GLUCOSE 95 10/15/2023   CHOL 234 (H) 10/15/2023   TRIG 166.0 (H) 10/15/2023   HDL 54.40 10/15/2023   LDLDIRECT 118.0 08/22/2022   LDLCALC 146 (H) 10/15/2023   ALT 27  10/15/2023   AST 20 10/15/2023   NA 139 10/15/2023   K 4.6 10/15/2023   CL 104 10/15/2023   CREATININE 0.76 10/15/2023   BUN 21 10/15/2023   CO2 28 10/15/2023   TSH 1.42 10/15/2023   HGBA1C 5.4 10/15/2023         Assessment & Plan:   Physical exam: Screening blood work  ordered Exercise  some - bike Weight  is normal Substance abuse  none   Reviewed recommended immunizations.  Prevnar today   Health Maintenance  Topic Date Due   DEXA SCAN  Never done   COVID-19 Vaccine (3 - Pfizer risk series) 10/31/2023 (Originally 08/06/2023)   DTaP/Tdap/Td (1 - Tdap) 01/06/2024 (Originally 08/15/1975)   Zoster Vaccines- Shingrix (2 of 2) 02/05/2024 (Originally 08/14/2018)   Medicare Annual Wellness (AWV)  10/09/2024   MAMMOGRAM  07/15/2025   Colonoscopy  01/31/2027   Pneumonia Vaccine 35+ Years old  Completed   INFLUENZA VACCINE  Completed   Hepatitis C Screening  Completed   HPV VACCINES  Aged Out          See Problem List for Assessment and Plan of chronic medical problems.

## 2023-10-14 NOTE — Patient Instructions (Addendum)
 Prenvar  given   Blood work was ordered.       Medications changes include :   None   A ct scan of your heart was ordered.     Return in about 1 year (around 10/14/2024) for Physical Exam.   Health Maintenance, Female Adopting a healthy lifestyle and getting preventive care are important in promoting health and wellness. Ask your health care provider about: The right schedule for you to have regular tests and exams. Things you can do on your own to prevent diseases and keep yourself healthy. What should I know about diet, weight, and exercise? Eat a healthy diet  Eat a diet that includes plenty of vegetables, fruits, low-fat dairy products, and lean protein. Do not eat a lot of foods that are high in solid fats, added sugars, or sodium. Maintain a healthy weight Body mass index (BMI) is used to identify weight problems. It estimates body fat based on height and weight. Your health care provider can help determine your BMI and help you achieve or maintain a healthy weight. Get regular exercise Get regular exercise. This is one of the most important things you can do for your health. Most adults should: Exercise for at least 150 minutes each week. The exercise should increase your heart rate and make you sweat (moderate-intensity exercise). Do strengthening exercises at least twice a week. This is in addition to the moderate-intensity exercise. Spend less time sitting. Even light physical activity can be beneficial. Watch cholesterol and blood lipids Have your blood tested for lipids and cholesterol at 68 years of age, then have this test every 5 years. Have your cholesterol levels checked more often if: Your lipid or cholesterol levels are high. You are older than 68 years of age. You are at high risk for heart disease. What should I know about cancer screening? Depending on your health history and family history, you may need to have cancer screening at various ages. This  may include screening for: Breast cancer. Cervical cancer. Colorectal cancer. Skin cancer. Lung cancer. What should I know about heart disease, diabetes, and high blood pressure? Blood pressure and heart disease High blood pressure causes heart disease and increases the risk of stroke. This is more likely to develop in people who have high blood pressure readings or are overweight. Have your blood pressure checked: Every 3-5 years if you are 69-40 years of age. Every year if you are 46 years old or older. Diabetes Have regular diabetes screenings. This checks your fasting blood sugar level. Have the screening done: Once every three years after age 44 if you are at a normal weight and have a low risk for diabetes. More often and at a younger age if you are overweight or have a high risk for diabetes. What should I know about preventing infection? Hepatitis B If you have a higher risk for hepatitis B, you should be screened for this virus. Talk with your health care provider to find out if you are at risk for hepatitis B infection. Hepatitis C Testing is recommended for: Everyone born from [birth year] through 1965. Anyone with known risk factors for hepatitis C. Sexually transmitted infections (STIs) Get screened for STIs, including gonorrhea and chlamydia, if: You are sexually active and are younger than 68 years of age. You are older than 68 years of age and your health care provider tells you that you are at risk for this type of infection. Your sexual activity has changed since you  were last screened, and you are at increased risk for chlamydia or gonorrhea. Ask your health care provider if you are at risk. Ask your health care provider about whether you are at high risk for HIV. Your health care provider may recommend a prescription medicine to help prevent HIV infection. If you choose to take medicine to prevent HIV, you should first get tested for HIV. You should then be tested every 3  months for as long as you are taking the medicine. Pregnancy If you are about to stop having your period (premenopausal) and you may become pregnant, seek counseling before you get pregnant. Take 400 to 800 micrograms (mcg) of folic acid every day if you become pregnant. Ask for birth control (contraception) if you want to prevent pregnancy. Osteoporosis and menopause Osteoporosis is a disease in which the bones lose minerals and strength with aging. This can result in bone fractures. If you are 34 years old or older, or if you are at risk for osteoporosis and fractures, ask your health care provider if you should: Be screened for bone loss. Take a calcium or vitamin D supplement to lower your risk of fractures. Be given hormone replacement therapy (HRT) to treat symptoms of menopause. Follow these instructions at home: Alcohol use Do not drink alcohol if: Your health care provider tells you not to drink. You are pregnant, may be pregnant, or are planning to become pregnant. If you drink alcohol: Limit how much you have to: 0-1 drink a day. Know how much alcohol is in your drink. In the [place] one drink equals one 12 oz bottle of beer (355 mL), one 5 oz glass of wine (148 mL), or one 1 oz glass of hard liquor (44 mL). Lifestyle Do not use any products that contain nicotine or tobacco. These products include cigarettes, chewing tobacco, and vaping devices, such as e-cigarettes. If you need help quitting, ask your health care provider. Do not use street drugs. Do not share needles. Ask your health care provider for help if you need support or information about quitting drugs. General instructions Schedule regular health, dental, and eye exams. Stay current with your vaccines. Tell your health care provider if: You often feel depressed. You have ever been abused or do not feel safe at home. Summary Adopting a healthy lifestyle and getting preventive care are important in promoting health  and wellness. Follow your health care provider's instructions about healthy diet, exercising, and getting tested or screened for diseases. Follow your health care provider's instructions on monitoring your cholesterol and blood pressure. This information is not intended to replace advice given to you by your health care provider. Make sure you discuss any questions you have with your health care provider. Document Revised: 02/13/2021 Document Reviewed: 02/13/2021 Elsevier Patient Education  2024 [employer].

## 2023-10-15 ENCOUNTER — Ambulatory Visit (INDEPENDENT_AMBULATORY_CARE_PROVIDER_SITE_OTHER): Payer: Medicare Other | Admitting: Internal Medicine

## 2023-10-15 VITALS — BP 130/90 | HR 68 | Temp 98.6°F | Ht 62.0 in | Wt 139.0 lb

## 2023-10-15 DIAGNOSIS — I10 Essential (primary) hypertension: Secondary | ICD-10-CM

## 2023-10-15 DIAGNOSIS — Z23 Encounter for immunization: Secondary | ICD-10-CM

## 2023-10-15 DIAGNOSIS — Z0000 Encounter for general adult medical examination without abnormal findings: Secondary | ICD-10-CM

## 2023-10-15 DIAGNOSIS — Z136 Encounter for screening for cardiovascular disorders: Secondary | ICD-10-CM

## 2023-10-15 DIAGNOSIS — K219 Gastro-esophageal reflux disease without esophagitis: Secondary | ICD-10-CM | POA: Diagnosis not present

## 2023-10-15 DIAGNOSIS — R739 Hyperglycemia, unspecified: Secondary | ICD-10-CM | POA: Diagnosis not present

## 2023-10-15 DIAGNOSIS — E2839 Other primary ovarian failure: Secondary | ICD-10-CM

## 2023-10-15 LAB — COMPREHENSIVE METABOLIC PANEL
ALT: 27 U/L (ref 0–35)
AST: 20 U/L (ref 0–37)
Albumin: 5 g/dL (ref 3.5–5.2)
Alkaline Phosphatase: 85 U/L (ref 39–117)
BUN: 21 mg/dL (ref 6–23)
CO2: 28 meq/L (ref 19–32)
Calcium: 9.5 mg/dL (ref 8.4–10.5)
Chloride: 104 meq/L (ref 96–112)
Creatinine, Ser: 0.76 mg/dL (ref 0.40–1.20)
GFR: 81.23 mL/min (ref >60.00)
Glucose, Bld: 95 mg/dL (ref 70–99)
Potassium: 4.6 meq/L (ref 3.5–5.1)
Sodium: 139 meq/L (ref 135–145)
Total Bilirubin: 0.6 mg/dL (ref 0.2–1.2)
Total Protein: 7 g/dL (ref 6.0–8.3)

## 2023-10-15 LAB — CBC WITH DIFFERENTIAL/PLATELET
Basophils Absolute: 0 10*3/uL (ref 0.0–0.1)
Basophils Relative: 0.7 % (ref 0.0–3.0)
Eosinophils Absolute: 0.1 10*3/uL (ref 0.0–0.7)
Eosinophils Relative: 2.2 % (ref 0.0–5.0)
HCT: 44.2 % (ref 36.0–46.0)
Hemoglobin: 14.9 g/dL (ref 12.0–15.0)
Lymphocytes Relative: 28.1 % (ref 12.0–46.0)
Lymphs Abs: 1.8 10*3/uL (ref 0.7–4.0)
MCHC: 33.8 g/dL (ref 30.0–36.0)
MCV: 88 fL (ref 78.0–100.0)
Monocytes Absolute: 0.6 10*3/uL (ref 0.1–1.0)
Monocytes Relative: 8.8 % (ref 3.0–12.0)
Neutro Abs: 3.9 10*3/uL (ref 1.4–7.7)
Neutrophils Relative %: 60.2 % (ref 43.0–77.0)
Platelets: 259 10*3/uL (ref 150.0–400.0)
RBC: 5.02 Mil/uL (ref 3.87–5.11)
RDW: 13.1 % (ref 11.5–15.5)
WBC: 6.5 10*3/uL (ref 4.0–10.5)

## 2023-10-15 LAB — LIPID PANEL
Cholesterol: 234 mg/dL — ABNORMAL HIGH (ref 0–200)
HDL: 54.4 mg/dL (ref >39.00)
LDL Cholesterol: 146 mg/dL — ABNORMAL HIGH (ref 0–99)
NonHDL: 179.64
Total CHOL/HDL Ratio: 4
Triglycerides: 166 mg/dL — ABNORMAL HIGH (ref 0.0–149.0)
VLDL: 33.2 mg/dL (ref 0.0–40.0)

## 2023-10-15 LAB — TSH: TSH: 1.42 u[IU]/mL (ref 0.35–5.50)

## 2023-10-15 LAB — HEMOGLOBIN A1C: Hgb A1c MFr Bld: 5.4 % (ref 4.6–6.5)

## 2023-10-15 NOTE — Assessment & Plan Note (Signed)
 Chronic Lab Results  Component Value Date   HGBA1C 5.4 10/15/2023   Check a1c Low sugar / carb diet Stressed regular exercise

## 2023-10-15 NOTE — Assessment & Plan Note (Addendum)
 Chronic BP slightly elevated here today  - better at home but has not been checking it regularly No change in medication today She will monitor BP at home - stressed monitoring it daily for 2 weeks Continue losartan 100 mg daily Cmp, cbc

## 2023-10-15 NOTE — Assessment & Plan Note (Signed)
Chronic GERD controlled Continue pantoprazole 40 mg daily prn  

## 2023-10-17 ENCOUNTER — Encounter: Payer: Self-pay | Admitting: Internal Medicine

## 2023-10-18 ENCOUNTER — Ambulatory Visit (HOSPITAL_COMMUNITY)
Admission: RE | Admit: 2023-10-18 | Discharge: 2023-10-18 | Disposition: A | Discharge status: Home / Self Care | Payer: Self-pay | Source: Ambulatory Visit | Attending: Internal Medicine | Admitting: Internal Medicine

## 2023-10-18 DIAGNOSIS — Z136 Encounter for screening for cardiovascular disorders: Secondary | ICD-10-CM | POA: Insufficient documentation

## 2023-10-24 ENCOUNTER — Telehealth: Payer: Self-pay

## 2023-10-24 NOTE — Telephone Encounter (Signed)
Surgical clearance faxed today to [facility].  Fax conformation received and 11 pages faxed (last OV and labs sent)

## 2023-10-27 ENCOUNTER — Encounter: Payer: Self-pay | Admitting: Internal Medicine

## 2023-10-27 DIAGNOSIS — I700 Atherosclerosis of aorta: Secondary | ICD-10-CM | POA: Insufficient documentation

## 2023-10-28 ENCOUNTER — Encounter: Payer: Self-pay | Admitting: Internal Medicine

## 2023-11-07 ENCOUNTER — Encounter: Payer: Self-pay | Admitting: Hematology

## 2023-11-08 ENCOUNTER — Other Ambulatory Visit: Payer: Self-pay

## 2023-11-08 DIAGNOSIS — C8307 Small cell B-cell lymphoma, spleen: Secondary | ICD-10-CM

## 2023-11-11 ENCOUNTER — Inpatient Hospital Stay (HOSPITAL_BASED_OUTPATIENT_CLINIC_OR_DEPARTMENT_OTHER): Payer: Medicare Other | Admitting: Hematology

## 2023-11-11 ENCOUNTER — Inpatient Hospital Stay: Payer: Medicare Other | Attending: Hematology

## 2023-11-11 VITALS — BP 131/75 | HR 71 | Temp 97.6°F | Resp 16 | Ht 62.0 in | Wt 135.5 lb

## 2023-11-11 DIAGNOSIS — D649 Anemia, unspecified: Secondary | ICD-10-CM | POA: Insufficient documentation

## 2023-11-11 DIAGNOSIS — R161 Splenomegaly, not elsewhere classified: Secondary | ICD-10-CM | POA: Diagnosis not present

## 2023-11-11 DIAGNOSIS — C8307 Small cell B-cell lymphoma, spleen: Secondary | ICD-10-CM | POA: Insufficient documentation

## 2023-11-11 LAB — CBC WITH DIFFERENTIAL (CANCER CENTER ONLY)
Abs Immature Granulocytes: 0 10*3/uL (ref 0.00–0.07)
Basophils Absolute: 0 10*3/uL (ref 0.0–0.1)
Basophils Relative: 1 %
Eosinophils Absolute: 0.1 10*3/uL (ref 0.0–0.5)
Eosinophils Relative: 2 %
HCT: 42.5 % (ref 36.0–46.0)
Hemoglobin: 14.4 g/dL (ref 12.0–15.0)
Immature Granulocytes: 0 %
Lymphocytes Relative: 34 %
Lymphs Abs: 1.5 10*3/uL (ref 0.7–4.0)
MCH: 29.1 pg (ref 26.0–34.0)
MCHC: 33.9 g/dL (ref 30.0–36.0)
MCV: 86 fL (ref 80.0–100.0)
Monocytes Absolute: 0.4 10*3/uL (ref 0.1–1.0)
Monocytes Relative: 10 %
Neutro Abs: 2.4 10*3/uL (ref 1.7–7.7)
Neutrophils Relative %: 53 %
Platelet Count: 224 10*3/uL (ref 150–400)
RBC: 4.94 MIL/uL (ref 3.87–5.11)
RDW: 12.3 % (ref 11.5–15.5)
WBC Count: 4.4 10*3/uL (ref 4.0–10.5)
nRBC: 0 % (ref 0.0–0.2)

## 2023-11-11 LAB — CMP (CANCER CENTER ONLY)
ALT: 17 U/L (ref 0–44)
AST: 17 U/L (ref 15–41)
Albumin: 4.6 g/dL (ref 3.5–5.0)
Alkaline Phosphatase: 70 U/L (ref 38–126)
Anion gap: 6 (ref 5–15)
BUN: 20 mg/dL (ref 8–23)
CO2: 28 mmol/L (ref 22–32)
Calcium: 9.3 mg/dL (ref 8.9–10.3)
Chloride: 105 mmol/L (ref 98–111)
Creatinine: 0.77 mg/dL (ref 0.44–1.00)
GFR, Estimated: >60 mL/min (ref >60)
Glucose, Bld: 102 mg/dL — ABNORMAL HIGH (ref 70–99)
Potassium: 3.9 mmol/L (ref 3.5–5.1)
Sodium: 139 mmol/L (ref 135–145)
Total Bilirubin: 0.6 mg/dL (ref 0.0–1.2)
Total Protein: 6.3 g/dL — ABNORMAL LOW (ref 6.5–8.1)

## 2023-11-11 LAB — LACTATE DEHYDROGENASE: LDH: 191 U/L (ref 98–192)

## 2023-11-11 NOTE — Progress Notes (Shared)
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 is here for continued evaluation and management of her splenic marginal zone lymphoma.  Patient was last seen by me on 05/21/2023 and she was doing well overall.   Patient notes she has been doing well overall since our last visit. She has been endorsing occasional chest tightness. She had CT cardiac scoring which was negative. She is scheduled to have total knee replacement on 12/04/2023.   She denies any new infection issues, fever, chills, night sweats, unexpected weight loss, back pain, chest pain, or leg swelling. She does complain of bilateral knee pain.   She denies taking Vitamin-D supplement in the past month.   Past Medical History:  Diagnosis Date   Arthritis    Cancer (HCC)    Hypertension     SURGICAL HISTORY: No past surgical history on file.  SOCIAL HISTORY: Social History   Socioeconomic History   Marital status: Married    Spouse name: Liborio Nixon   Number of children: Not on file   Years of education: Not on file   Highest education level: Bachelor's degree (e.g., BA, AB, BS)  Occupational History   Not on file  Tobacco Use   Smoking status: Never   Smokeless tobacco: Never  Vaping Use   Vaping status: Never Used  Substance and Sexual Activity   Alcohol use: Not Currently    Alcohol/week: 0.0 standard drinks of alcohol   Drug use: Never   Sexual activity: Not on file  Other Topics Concern   Not on file  Social History Narrative   Lives with spouse.  Has one dog.   Social Drivers of Corporate investment banker Strain: Low Risk  (10/15/2023)   Overall  Financial Resource Strain (CARDIA)    Difficulty of Paying Living Expenses: Not hard at all  Food Insecurity: No Food Insecurity (10/15/2023)   Hunger Vital Sign    Worried About Running Out of Food in the Last Year: Never true    Ran Out of Food in the Last Year: Never true  Transportation Needs: No Transportation Needs (10/15/2023)   PRAPARE - Administrator, Civil Service (Medical): No    Lack of Transportation (Non-Medical): No  Physical Activity: Sufficiently Active (10/15/2023)   Exercise Vital Sign    Days of Exercise per Week: 5 days    Minutes of Exercise per Session: 30 min  Stress: Stress Concern Present (10/15/2023)   [employer] of Occupational Health - Occupational Stress Questionnaire    Feeling of Stress : To some extent  Social Connections: Socially Integrated (10/15/2023)   Social Connection and Isolation Panel [NHANES]    Frequency of Communication with Friends and Family: More than three times a week    Frequency of Social Gatherings with Friends and Family: More than three times a week    Attends Religious Services: More than 4 times per year    Active Member of [employer] or Organizations: Yes    Attends Banker Meetings: 1 to 4 times per year    Marital Status: Married  Catering manager Violence: Not At Risk (10/10/2023)   Humiliation, Afraid, Rape, and Kick questionnaire  Fear of Current or Ex-Partner: No    Emotionally Abused: No    Physically Abused: No    Sexually Abused: No    FAMILY HISTORY: Family History  Problem Relation Age of Onset   Diabetes Mother    Hyperlipidemia Mother    Hypertension Mother    Rheum arthritis Brother    Diabetes Brother     ALLERGIES:  is allergic to biaxin [clarithromycin] and doxycycline.  MEDICATIONS:  Current Outpatient Medications  Medication Sig Dispense Refill   celecoxib (CELEBREX) 200 MG capsule Take 1 capsule (200 mg total) by mouth 2 (two) times daily as needed. 180 capsule 3    ciprofloxacin-dexamethasone (CIPRODEX) OTIC suspension Place 4 drops into both ears 2 (two) times daily as needed. 7.5 mL 5   folic acid (FOLVITE) 800 MCG tablet Take 800 mcg by mouth daily.      losartan (COZAAR) 50 MG tablet Take 1 tablet (50 mg total) by mouth in the morning and at bedtime. 180 tablet 3   pantoprazole (PROTONIX) 40 MG tablet Take 1 tablet (40 mg total) by mouth daily. 90 tablet 1   valACYclovir (VALTREX) 1000 MG tablet Take 1 tablet (1,000 mg total) by mouth 2 (two) times daily. 20 tablet 0   vitamin B-12 (CYANOCOBALAMIN) 1000 MCG tablet Take 1,000 mcg by mouth daily.     No current facility-administered medications for this visit.    REVIEW OF SYSTEMS:    .10 Point review of Systems was done is negative except as noted above.  PHYSICAL EXAMINATION: ECOG FS:1 - Symptomatic but completely ambulatory  There were no vitals filed for this visit.    Wt Readings from Last 3 Encounters:  10/15/23 139 lb (63 kg)  10/10/23 135 lb (61.2 kg)  05/22/23 135 lb (61.2 kg)   There is no height or weight on file to calculate BMI.    Marland Kitchen GENERAL:alert, in no acute distress and comfortable EYES: conjunctiva are pink and non-injected, sclera anicteric OROPHARYNX: MMM, no exudates, no oropharyngeal erythema or ulceration NECK: supple, no JVD LYMPH:  no palpable lymphadenopathy in the cervical, axillary or inguinal regions LUNGS: clear to auscultation b/l with normal respiratory effort HEART: regular rate & rhythm ABDOMEN:  normoactive bowel sounds , non tender, not distended.no palpable splenomegaly Extremity: no pedal edema PSYCH: alert & oriented x 3 with fluent speech NEURO: no focal motor/sensory deficits   LABORATORY DATA:  I have reviewed the data as listed  .    Latest Ref Rng & Units 10/15/2023    4:15 PM 05/22/2023    2:42 PM 11/09/2022    1:25 PM  CBC  WBC 4.0 - 10.5 K/uL 6.5  6.5  5.4   Hemoglobin 12.0 - 15.0 g/dL 01.0  27.2  53.6   Hematocrit 36.0 - 46.0 %  44.2  43.0  43.5   Platelets 150.0 - 400.0 K/uL 259.0  227  229    CBC    Component Value Date/Time   WBC 6.5 10/15/2023 1615   RBC 5.02 10/15/2023 1615   HGB 14.9 10/15/2023 1615   HGB 15.1 (H) 05/22/2023 1442   HGB 12.8 09/10/2017 0801   HCT 44.2 10/15/2023 1615   HCT 39.4 09/10/2017 0801   PLT 259.0 10/15/2023 1615   PLT 227 05/22/2023 1442   PLT 144 (L) 09/10/2017 0801   MCV 88.0 10/15/2023 1615   MCV 79.0 (L) 09/10/2017 0801   MCH 30.2 05/22/2023 1442   MCHC 33.8 10/15/2023 1615   RDW 13.1 10/15/2023  1615   RDW 15.6 (H) 09/10/2017 0801   LYMPHSABS 1.8 10/15/2023 1615   LYMPHSABS 0.9 09/10/2017 0801   MONOABS 0.6 10/15/2023 1615   MONOABS 0.4 09/10/2017 0801   EOSABS 0.1 10/15/2023 1615   EOSABS 0.1 09/10/2017 0801   BASOSABS 0.0 10/15/2023 1615   BASOSABS 0.0 09/10/2017 0801    .    Latest Ref Rng & Units 10/15/2023    4:15 PM 05/22/2023    2:42 PM 11/09/2022    1:25 PM  CMP  Glucose 70 - 99 mg/dL 95  95  95   BUN 6 - 23 mg/dL 21  19  17    Creatinine 0.40 - 1.20 mg/dL 4.78  2.95  6.21   Sodium 135 - 145 mEq/L 139  139  141   Potassium 3.5 - 5.1 mEq/L 4.6  4.1  4.2   Chloride 96 - 112 mEq/L 104  105  108   CO2 19 - 32 mEq/L 28  27  26    Calcium 8.4 - 10.5 mg/dL 9.5  9.6  9.7   Total Protein 6.0 - 8.3 g/dL 7.0  6.7  6.3   Total Bilirubin 0.2 - 1.2 mg/dL 0.6  1.0  0.7   Alkaline Phos 39 - 117 U/L 85  71  77   AST 0 - 37 U/L 20  23  24    ALT 0 - 35 U/L 27  29  45    . Lab Results  Component Value Date   LDH 215 (H) 05/22/2023   12/27/17 BM Bx:   12/27/17 Flow Cytometry:     RADIOGRAPHIC STUDIES: I have personally reviewed the radiological images as listed and agreed with the findings in the report. CT CARDIAC SCORING (SELF PAY ONLY) Addendum Date: 10/26/2023 ADDENDUM REPORT: 10/26/2023 18:53 EXAM: OVER-READ INTERPRETATION CT CHEST The following report is an over-read performed by radiologist Dr. Romona Curls of [facility] on 10/26/2023. This  over-read does not include interpretation of cardiac or coronary anatomy or pathology. The coronary calcium score interpretation by the cardiologist is attached. COMPARISON:  Chest CT dated 09/12/2020. FINDINGS: Cardiovascular: Vascular calcifications are seen in the thoracic aorta. Normal heart size. No pericardial effusion. Mediastinum/Nodes: No enlarged mediastinal lymph nodes. The visible trachea and esophagus demonstrate no significant findings. Lungs/Pleura: Minimal atelectasis in the lingula. No pleural effusion. Upper Abdomen: No acute abnormality. Musculoskeletal: Degenerative changes are seen in the spine. IMPRESSION: 1. No acute findings in the chest. Aortic Atherosclerosis (ICD10-I70.0). Electronically Signed   By: Romona Curls M.D.   On: 10/26/2023 18:53   Result Date: 10/26/2023 CLINICAL DATA:  Cardiovascular Disease Risk stratification EXAM: Coronary Calcium Score TECHNIQUE: A gated, non-contrast computed tomography scan of the heart was performed using 3mm slice thickness. Axial images were analyzed on a dedicated workstation. Calcium scoring of the coronary arteries was performed using the Agatston method. MEDICATIONS: MEDICATIONS None FINDINGS: Coronary arteries: Normal origins. Coronary Calcium Score: Left main: 0 Left anterior descending artery: 0 Left circumflex artery: 0 Right coronary artery: 0 Total: 0 Pericardium: Normal. Ascending Aorta: Normal caliber. Non-cardiac: See separate report from [facility]. IMPRESSION: Coronary calcium score of 0 Agatston units. This suggests low risk for future cardiac events. RECOMMENDATIONS: Coronary artery calcium (CAC) score is a strong predictor of incident coronary heart disease (CHD) and provides predictive information beyond traditional risk factors. CAC scoring is reasonable to use in the decision to withhold, postpone, or initiate statin therapy in intermediate-risk or selected borderline-risk asymptomatic adults (age 70-75 years and  LDL-C >=70 to <190 mg/dL) who do not have diabetes or established atherosclerotic cardiovascular disease (ASCVD).* In intermediate-risk (10-year ASCVD risk >=7.5% to <20%) adults or selected borderline-risk (10-year ASCVD risk >=5% to <7.5%) adults in whom a CAC score is measured for the purpose of making a treatment decision the following recommendations have been made: If CAC=0, it is reasonable to withhold statin therapy and reassess in 5 to 10 years, as long as higher risk conditions are absent (diabetes mellitus, family history of premature CHD in first degree relatives (males <55 years; females <65 years), cigarette smoking, or LDL >=190 mg/dL). If CAC is 1 to 99, it is reasonable to initiate statin therapy for patients >=40 years of age. If CAC is >=100 or >=75th percentile, it is reasonable to initiate statin therapy at any age. Cardiology referral should be considered for patients with CAC scores >=400 or >=75th percentile. *2018 AHA/ACC/AACVPR/AAPA/ABC/ACPM/ADA/AGS/APhA/ASPC/NLA/PCNA Guideline on the Management of Blood Cholesterol: A Report of the American College of Cardiology/American Heart Association Task Force on Clinical Practice Guidelines. J Am Coll Cardiol. 2019;73(24):3168-3209. Marca Ancona, MD Electronically Signed: By: Marca Ancona M.D. On: 10/18/2023 15:26    ASSESSMENT & PLAN:   68 y.o. female with  1. 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 Patient appeared to be have significant constitutional symptoms . No evidence of rheumatological or infectious etiology for her fevers and night sweats. Constitutional symptoms now resolved.  2. S/p Splenomegaly - likely due to SMZL. Marland Kitchenimproved on [place] abd. Clinical no palpable splenomegaly today.  04/18/18 US Abdomen revealed Splenomegaly with a length of 13.3 cm and a volume of 432 cc. By report, the spleen measured up to 15.1 cm in maximum dimension on the PET-CT from January 08, 2018 suggesting interval  improvement.   3. S/p Anemia - with elevated LDH and low haptoglobin concerning for hemolysis. Coombs neg . Could be IgA driven Coombs neg AIHA. +ve reticulocytosis and Smear with some microspherocytes. No over urine hemosiderinuria. Partly anemia could also be from hypersplenism.    4.  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 Per rheumatology - no evidence of primary rheumatological disorder. No overt evidence of endocarditis.  5. h/o Fevers/chills/night sweat -- likely constitutional symptoms related to ?lymphoma - now resolved.   PLAN:  -Discussed lab results from today, 11/11/2023, in detail with the patient. CBC stable. CMP pending. LDH pending.  -Discussed with the patient that she can take preventative dose of Xarelto or Eliquis, whichever insurance approves.  -Recommend to start Vitamin-D supplement 2,000 unit and B-Complex. No clinical or lab evidence of splenic marginal zone lymphoma progression at this time. -No indication for additional treatment of the patient's splenic marginal zone lymphoma at this time.  FOLLOW-UP: RTC with Dr Candise Che with labs in 6 months  The total time spent in the appointment was *** minutes* .  All of the patient's questions were answered with apparent satisfaction. The patient knows to call the clinic with any problems, questions or concerns.   Wyvonnia Lora MD MS AAHIVMS [facility] Hematology/Oncology Physician [facility]  .*Total Encounter Time as defined by the Centers for Medicare and Medicaid Services includes, in addition to the face-to-face time of a patient visit (documented in the note above) non-face-to-face time: obtaining and reviewing outside history, ordering and reviewing medications, tests or procedures, care coordination (communications with other health care professionals or caregivers) and documentation in the medical record.   I,Param Shah,acting as a Neurosurgeon for  Wyvonnia Lora,  MD.,have documented all relevant documentation on the behalf of Wyvonnia Lora, MD,as directed by  Wyvonnia Lora, MD while in the presence of Wyvonnia Lora, MD.

## 2023-11-12 ENCOUNTER — Telehealth: Payer: Self-pay | Admitting: Hematology

## 2023-11-12 NOTE — Telephone Encounter (Signed)
 Spoke with patient confirming upcoming appointment

## 2023-11-15 ENCOUNTER — Encounter: Payer: Self-pay | Admitting: Internal Medicine

## 2023-11-18 DIAGNOSIS — M1711 Unilateral primary osteoarthritis, right knee: Secondary | ICD-10-CM | POA: Diagnosis not present

## 2023-11-21 ENCOUNTER — Encounter: Payer: Self-pay | Admitting: Internal Medicine

## 2023-12-04 DIAGNOSIS — M1711 Unilateral primary osteoarthritis, right knee: Secondary | ICD-10-CM | POA: Diagnosis not present

## 2023-12-04 DIAGNOSIS — M25761 Osteophyte, right knee: Secondary | ICD-10-CM | POA: Diagnosis not present

## 2023-12-04 DIAGNOSIS — G8918 Other acute postprocedural pain: Secondary | ICD-10-CM | POA: Diagnosis not present

## 2023-12-06 DIAGNOSIS — M25661 Stiffness of right knee, not elsewhere classified: Secondary | ICD-10-CM | POA: Diagnosis not present

## 2023-12-06 DIAGNOSIS — M25561 Pain in right knee: Secondary | ICD-10-CM | POA: Diagnosis not present

## 2023-12-09 DIAGNOSIS — M25661 Stiffness of right knee, not elsewhere classified: Secondary | ICD-10-CM | POA: Diagnosis not present

## 2023-12-09 DIAGNOSIS — M25561 Pain in right knee: Secondary | ICD-10-CM | POA: Diagnosis not present

## 2023-12-11 DIAGNOSIS — M25561 Pain in right knee: Secondary | ICD-10-CM | POA: Diagnosis not present

## 2023-12-11 DIAGNOSIS — M25661 Stiffness of right knee, not elsewhere classified: Secondary | ICD-10-CM | POA: Diagnosis not present

## 2023-12-13 DIAGNOSIS — M25561 Pain in right knee: Secondary | ICD-10-CM | POA: Diagnosis not present

## 2023-12-13 DIAGNOSIS — M25661 Stiffness of right knee, not elsewhere classified: Secondary | ICD-10-CM | POA: Diagnosis not present

## 2023-12-16 DIAGNOSIS — M25561 Pain in right knee: Secondary | ICD-10-CM | POA: Diagnosis not present

## 2023-12-16 DIAGNOSIS — M25661 Stiffness of right knee, not elsewhere classified: Secondary | ICD-10-CM | POA: Diagnosis not present

## 2023-12-18 DIAGNOSIS — M25561 Pain in right knee: Secondary | ICD-10-CM | POA: Diagnosis not present

## 2023-12-18 DIAGNOSIS — M25661 Stiffness of right knee, not elsewhere classified: Secondary | ICD-10-CM | POA: Diagnosis not present

## 2023-12-20 DIAGNOSIS — M25661 Stiffness of right knee, not elsewhere classified: Secondary | ICD-10-CM | POA: Diagnosis not present

## 2023-12-20 DIAGNOSIS — M25561 Pain in right knee: Secondary | ICD-10-CM | POA: Diagnosis not present

## 2023-12-24 DIAGNOSIS — M25561 Pain in right knee: Secondary | ICD-10-CM | POA: Diagnosis not present

## 2023-12-24 DIAGNOSIS — M25661 Stiffness of right knee, not elsewhere classified: Secondary | ICD-10-CM | POA: Diagnosis not present

## 2023-12-26 DIAGNOSIS — M25661 Stiffness of right knee, not elsewhere classified: Secondary | ICD-10-CM | POA: Diagnosis not present

## 2023-12-26 DIAGNOSIS — M25561 Pain in right knee: Secondary | ICD-10-CM | POA: Diagnosis not present

## 2023-12-31 DIAGNOSIS — M25561 Pain in right knee: Secondary | ICD-10-CM | POA: Diagnosis not present

## 2023-12-31 DIAGNOSIS — M25661 Stiffness of right knee, not elsewhere classified: Secondary | ICD-10-CM | POA: Diagnosis not present

## 2024-01-02 DIAGNOSIS — M25661 Stiffness of right knee, not elsewhere classified: Secondary | ICD-10-CM | POA: Diagnosis not present

## 2024-01-02 DIAGNOSIS — M25561 Pain in right knee: Secondary | ICD-10-CM | POA: Diagnosis not present

## 2024-01-07 DIAGNOSIS — M25661 Stiffness of right knee, not elsewhere classified: Secondary | ICD-10-CM | POA: Diagnosis not present

## 2024-01-07 DIAGNOSIS — M25561 Pain in right knee: Secondary | ICD-10-CM | POA: Diagnosis not present

## 2024-01-10 ENCOUNTER — Other Ambulatory Visit: Payer: Self-pay | Admitting: Internal Medicine

## 2024-01-10 DIAGNOSIS — M25561 Pain in right knee: Secondary | ICD-10-CM | POA: Diagnosis not present

## 2024-01-10 DIAGNOSIS — Z5189 Encounter for other specified aftercare: Secondary | ICD-10-CM | POA: Diagnosis not present

## 2024-01-10 DIAGNOSIS — M25661 Stiffness of right knee, not elsewhere classified: Secondary | ICD-10-CM | POA: Diagnosis not present

## 2024-01-14 ENCOUNTER — Other Ambulatory Visit (HOSPITAL_COMMUNITY): Payer: Self-pay

## 2024-01-14 DIAGNOSIS — M25661 Stiffness of right knee, not elsewhere classified: Secondary | ICD-10-CM | POA: Diagnosis not present

## 2024-01-14 DIAGNOSIS — M25561 Pain in right knee: Secondary | ICD-10-CM | POA: Diagnosis not present

## 2024-01-16 DIAGNOSIS — M25561 Pain in right knee: Secondary | ICD-10-CM | POA: Diagnosis not present

## 2024-01-16 DIAGNOSIS — M25661 Stiffness of right knee, not elsewhere classified: Secondary | ICD-10-CM | POA: Diagnosis not present

## 2024-01-21 DIAGNOSIS — M25661 Stiffness of right knee, not elsewhere classified: Secondary | ICD-10-CM | POA: Diagnosis not present

## 2024-01-21 DIAGNOSIS — M25561 Pain in right knee: Secondary | ICD-10-CM | POA: Diagnosis not present

## 2024-01-23 DIAGNOSIS — M25661 Stiffness of right knee, not elsewhere classified: Secondary | ICD-10-CM | POA: Diagnosis not present

## 2024-01-23 DIAGNOSIS — M25561 Pain in right knee: Secondary | ICD-10-CM | POA: Diagnosis not present

## 2024-02-10 ENCOUNTER — Ambulatory Visit (INDEPENDENT_AMBULATORY_CARE_PROVIDER_SITE_OTHER): Admitting: Family Medicine

## 2024-02-10 ENCOUNTER — Encounter: Payer: Self-pay | Admitting: Family Medicine

## 2024-02-10 ENCOUNTER — Ambulatory Visit: Payer: Self-pay

## 2024-02-10 ENCOUNTER — Encounter: Payer: Self-pay | Admitting: Internal Medicine

## 2024-02-10 VITALS — BP 130/72 | HR 90 | Temp 97.7°F | Ht 62.0 in | Wt 129.0 lb

## 2024-02-10 DIAGNOSIS — N3001 Acute cystitis with hematuria: Secondary | ICD-10-CM

## 2024-02-10 LAB — POCT URINALYSIS DIPSTICK
Bilirubin, UA: NEGATIVE
Glucose, UA: NEGATIVE
Ketones, UA: NEGATIVE
Nitrite, UA: NEGATIVE
Protein, UA: NEGATIVE
Spec Grav, UA: 1.02 (ref 1.010–1.025)
Urobilinogen, UA: 0.2 U/dL
pH, UA: 5 (ref 5.0–8.0)

## 2024-02-10 MED ORDER — CEFDINIR 300 MG PO CAPS: 300.0000 mg | ORAL_CAPSULE | Freq: Two times a day (BID) | ORAL | 0 refills | Status: AC

## 2024-02-10 NOTE — Progress Notes (Signed)
 Assessment & Plan:  1. Acute cystitis with hematuria (Primary) Education provided on UTIs. Encouraged adequate hydration.  - POCT Urinalysis Dipstick - Urine Culture; Future - cefdinir  (OMNICEF ) 300 MG capsule; Take 1 capsule (300 mg total) by mouth 2 (two) times daily for 7 days.  Dispense: 14 capsule; Refill: 0   Follow up plan: Return if symptoms worsen or fail to improve.  Hershel Los, MSN, APRN, FNP-C  Subjective:  HPI: Emily Foley is a 68 y.o. female presenting on 02/10/2024 for Dysuria (Urinary frequency, burning and pelvic pressure x 1 day)  Patient complains of dysuria, frequency, hematuria, and pain in the lower abdomen. She has had symptoms for 1 day. Patient denies fever. Patient does have a history of recurrent UTI.  Patient does not have a history of pyelonephritis.  Patient states she took a home urine test and that it was positive for nitrites and leukocytes.    ROS: Negative unless specifically indicated above in HPI.   Relevant past medical history reviewed and updated as indicated.   Allergies and medications reviewed and updated.   Current Outpatient Medications:    celecoxib  (CELEBREX ) 200 MG capsule, Take 1 capsule (200 mg total) by mouth 2 (two) times daily as needed., Disp: 180 capsule, Rfl: 3   ciprofloxacin -dexamethasone  (CIPRODEX ) OTIC suspension, Place 4 drops into both ears 2 (two) times daily as needed., Disp: 7.5 mL, Rfl: 5   folic acid (FOLVITE) 800 MCG tablet, Take 800 mcg by mouth daily. , Disp: , Rfl:    losartan  (COZAAR ) 50 MG tablet, TAKE 1 TABLET (50 MG) BY MOUTH IN THE MORNING AND AT BEDTIME, Disp: 180 tablet, Rfl: 3   pantoprazole  (PROTONIX ) 40 MG tablet, Take 1 tablet (40 mg total) by mouth daily., Disp: 90 tablet, Rfl: 1   valACYclovir  (VALTREX ) 1000 MG tablet, Take 1 tablet (1,000 mg total) by mouth 2 (two) times daily., Disp: 20 tablet, Rfl: 0   vitamin B-12 (CYANOCOBALAMIN) 1000 MCG tablet, Take 1,000 mcg by mouth daily., Disp: ,  Rfl:   Allergies  Allergen Reactions   Biaxin [Clarithromycin] Diarrhea   Doxycycline      Tingling sensation, nausea    Objective:   BP 130/72   Pulse 90   Temp 97.7 F (36.5 C)   Ht 5\' 2"  (1.575 m)   Wt 129 lb (58.5 kg)   SpO2 94%   BMI 23.59 kg/m    Physical Exam Vitals reviewed.  Constitutional:      General: She is not in acute distress.    Appearance: Normal appearance. She is not ill-appearing, toxic-appearing or diaphoretic.  HENT:     Head: Normocephalic and atraumatic.  Eyes:     General: No scleral icterus.       Right eye: No discharge.        Left eye: No discharge.     Conjunctiva/sclera: Conjunctivae normal.  Cardiovascular:     Rate and Rhythm: Normal rate.  Pulmonary:     Effort: Pulmonary effort is normal. No respiratory distress.  Abdominal:     Tenderness: There is no right CVA tenderness or left CVA tenderness.  Musculoskeletal:        General: Normal range of motion.     Cervical back: Normal range of motion.  Skin:    General: Skin is warm and dry.     Capillary Refill: Capillary refill takes less than 2 seconds.  Neurological:     General: No focal deficit present.     Mental  Status: She is alert and oriented to person, place, and time. Mental status is at baseline.  Psychiatric:        Mood and Affect: Mood normal.        Behavior: Behavior normal.        Thought Content: Thought content normal.        Judgment: Judgment normal.

## 2024-02-10 NOTE — Telephone Encounter (Signed)
  Chief Complaint: urinary symptoms Symptoms: urinary frequency, burning with urination, spots of blood in urine, lower abdominal pressure Frequency: x7 times this morning; started yesterday Pertinent Negatives: Patient denies fever, vaginal discharge, lower back or flank pain. Disposition: [] ED /[] Urgent Care (no appt availability in office) / [x] Appointment(In office/virtual)/ []  [facility]/ [] Home Care/ [] Refused Recommended Disposition /[] [facility]/ []  Follow-up with PCP Additional Notes: Patient states she took OTC home urine tests and states it was positive for nitrates and leukocytes. Patient is concerned due to she had a knee replacement about 9 weeks ago and she would like to get on antibiotics right away. Offered patient appts with PCP, soonest available is this afternoon. Patient declined and states she would like to be seen sooner. Patient scheduled with FNP Bella Bowman.  Copied from CRM [id number]. Topic: Clinical - Red Word Triage >> Feb 10, 2024  7:42 AM Hamdi H wrote: Red Word that prompted transfer to Nurse Triage: UTI symptoms such as burning, abdominal pain, and urinary frequency. Patient has a recent knee replacement. Reason for Disposition  Age > 50 years  Answer Assessment - Initial Assessment Questions 1. SEVERITY: "How bad is the pain?"  (e.g., Scale 1-10; mild, moderate, or severe)   - MILD (1-3): complains slightly about urination hurting   - MODERATE (4-7): interferes with normal activities     - SEVERE (8-10): excruciating, unwilling or unable to urinate because of the pain      Severe last night, improved this morning after drinking lots of water.  2. FREQUENCY: "How many times have you had painful urination today?"      7 times.  3. PATTERN: "Is pain present every time you urinate or just sometimes?"      Every time.  4. ONSET: "When did the painful urination start?"      Yesterday, midmorning.  5. FEVER: "Do you have a fever?" If  Yes, ask: "What is your temperature, how was it measured, and when did it start?"     Denies.  6. PAST UTI: "Have you had a urine infection before?" If Yes, ask: "When was the last time?" and "What happened that time?"      Yes, last time she is unsure within a few years.  7. CAUSE: "What do you think is causing the painful urination?"  (e.g., UTI, scratch, Herpes sore)     UTI.  8. OTHER SYMPTOMS: "Do you have any other symptoms?" (e.g., blood in urine, flank pain, genital sores, urgency, vaginal discharge)     Light blood in urine when she wipes (spot or two), lower abdominal pressure.  9. PREGNANCY: "Is there any chance you are pregnant?" "When was your last menstrual period?"     N/A.  Protocols used: Urination Pain - Female-A-AH

## 2024-02-12 LAB — URINE CULTURE

## 2024-02-24 ENCOUNTER — Ambulatory Visit (INDEPENDENT_AMBULATORY_CARE_PROVIDER_SITE_OTHER)
Admission: RE | Admit: 2024-02-24 | Discharge: 2024-02-24 | Disposition: A | Discharge status: Home / Self Care | Source: Ambulatory Visit | Attending: Internal Medicine | Admitting: Internal Medicine

## 2024-02-24 DIAGNOSIS — E2839 Other primary ovarian failure: Secondary | ICD-10-CM | POA: Diagnosis not present

## 2024-02-25 ENCOUNTER — Ambulatory Visit: Payer: Self-pay | Admitting: Internal Medicine

## 2024-03-03 ENCOUNTER — Encounter: Payer: Self-pay | Admitting: Internal Medicine

## 2024-03-03 NOTE — Progress Notes (Unsigned)
    Subjective:    Patient ID: Emily Foley, female    DOB: September 04, 1956, 68 y.o.   MRN: [id number]      HPI Emily Foley is here for No chief complaint on file.   Seen 5/5 - dx with UTI - rx'd cefinir 300 mg bid x 7 days  ? UTI:  Her symptoms started  *** days ago.  She states dysuria, urinary frequency, urinary urgency, hematuria, abdominal pain, back pain, nausea, fever.  She denies other symptoms.    Bactrim or macrobid    Medications and allergies reviewed with patient and updated if appropriate.  Current Outpatient Medications on File Prior to Visit  Medication Sig Dispense Refill   celecoxib  (CELEBREX ) 200 MG capsule Take 1 capsule (200 mg total) by mouth 2 (two) times daily as needed. 180 capsule 3   ciprofloxacin -dexamethasone  (CIPRODEX ) OTIC suspension Place 4 drops into both ears 2 (two) times daily as needed. 7.5 mL 5   folic acid (FOLVITE) 800 MCG tablet Take 800 mcg by mouth daily.      losartan  (COZAAR ) 50 MG tablet TAKE 1 TABLET (50 MG) BY MOUTH IN THE MORNING AND AT BEDTIME 180 tablet 3   pantoprazole  (PROTONIX ) 40 MG tablet Take 1 tablet (40 mg total) by mouth daily. 90 tablet 1   valACYclovir  (VALTREX ) 1000 MG tablet Take 1 tablet (1,000 mg total) by mouth 2 (two) times daily. 20 tablet 0   vitamin B-12 (CYANOCOBALAMIN) 1000 MCG tablet Take 1,000 mcg by mouth daily.     No current facility-administered medications on file prior to visit.    Review of Systems     Objective:  There were no vitals filed for this visit. BP Readings from Last 3 Encounters:  02/10/24 130/72  11/11/23 131/75  10/15/23 (!) 130/90   Wt Readings from Last 3 Encounters:  02/10/24 129 lb (58.5 kg)  11/11/23 135 lb 8 oz (61.5 kg)  10/15/23 139 lb (63 kg)   There is no height or weight on file to calculate BMI.    Physical Exam Constitutional:      General: She is not in acute distress.    Appearance: Normal appearance. She is not ill-appearing.  HENT:     Head: Normocephalic.   Eyes:     Conjunctiva/sclera: Conjunctivae normal.  Abdominal:     General: There is no distension.     Palpations: Abdomen is soft.     Tenderness: There is no abdominal tenderness. There is no right CVA tenderness, left CVA tenderness, guarding or rebound.  Skin:    General: Skin is warm and dry.  Neurological:     Mental Status: She is alert.            Assessment & Plan:    See Problem List for Assessment and Plan of chronic medical problems.

## 2024-03-03 NOTE — Patient Instructions (Addendum)
   Your urine was sent for culture     Medications changes include :   None     Return if symptoms worsen or fail to improve.

## 2024-03-04 ENCOUNTER — Ambulatory Visit (INDEPENDENT_AMBULATORY_CARE_PROVIDER_SITE_OTHER): Admitting: Internal Medicine

## 2024-03-04 ENCOUNTER — Other Ambulatory Visit: Payer: Self-pay

## 2024-03-04 ENCOUNTER — Encounter: Payer: Self-pay | Admitting: Internal Medicine

## 2024-03-04 VITALS — BP 128/82 | HR 68 | Temp 98.1°F | Ht 62.0 in | Wt 130.0 lb

## 2024-03-04 DIAGNOSIS — N3000 Acute cystitis without hematuria: Secondary | ICD-10-CM | POA: Insufficient documentation

## 2024-03-04 DIAGNOSIS — R300 Dysuria: Secondary | ICD-10-CM

## 2024-03-04 LAB — POC URINALSYSI DIPSTICK (AUTOMATED)
Bilirubin, UA: NEGATIVE
Glucose, UA: NEGATIVE
Ketones, UA: NEGATIVE
Nitrite, UA: NEGATIVE
Protein, UA: NEGATIVE
Spec Grav, UA: 1.01 (ref 1.010–1.025)
Urobilinogen, UA: 0.2 U/dL
pH, UA: 6 (ref 5.0–8.0)

## 2024-03-04 MED ORDER — NITROFURANTOIN MONOHYD MACRO 100 MG PO CAPS: 100.0000 mg | ORAL_CAPSULE | Freq: Two times a day (BID) | ORAL | 0 refills | Status: DC

## 2024-03-04 NOTE — Addendum Note (Signed)
 Addended by: Katherene Pals on: 03/04/2024 04:48 PM   Modules accepted: Orders

## 2024-03-04 NOTE — Assessment & Plan Note (Signed)
 Acute Urine dip consistent with UTI Will send urine for culture Take Macrobid  100 mg bid x 7 days  Take tylenol  if needed.   Increase your water intake.  Call if no improvement

## 2024-03-07 ENCOUNTER — Ambulatory Visit: Payer: Self-pay | Admitting: Internal Medicine

## 2024-03-07 LAB — CULTURE, URINE COMPREHENSIVE

## 2024-03-19 DIAGNOSIS — M25561 Pain in right knee: Secondary | ICD-10-CM | POA: Diagnosis not present

## 2024-03-19 DIAGNOSIS — Z96651 Presence of right artificial knee joint: Secondary | ICD-10-CM | POA: Diagnosis not present

## 2024-03-19 DIAGNOSIS — M25661 Stiffness of right knee, not elsewhere classified: Secondary | ICD-10-CM | POA: Diagnosis not present

## 2024-05-08 ENCOUNTER — Other Ambulatory Visit: Payer: Self-pay

## 2024-05-08 DIAGNOSIS — C8307 Small cell B-cell lymphoma, spleen: Secondary | ICD-10-CM

## 2024-05-10 NOTE — Progress Notes (Signed)
 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 is a 68 y.o. 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  Patient was last seen by me on 11/11/2023 . Patient notes no acute new symptoms since her last clinic visit about 6 months ago. No fevers no chills no night sweats no unexpected weight loss. No new abdominal pain or distention. No new painful swollen joints. Did have a urinary tract infection in May 2025 which was treated with antibiotics by her PCP. She did have a bone density study in May 2025 which showed normal bone density.    Past Medical History:  Diagnosis Date   Arthritis    Cancer (HCC)    Hypertension     SURGICAL HISTORY: Past Surgical History:  Procedure Laterality Date   BREAST BIOPSY     KNEE SURGERY      SOCIAL HISTORY: Social History   Socioeconomic History   Marital status: Married    Spouse name: Romero   Number of children: Not on file   Years of education: Not on file   Highest education level: Bachelor's degree (e.g., BA, AB, BS)  Occupational History   Not on file  Tobacco Use   Smoking status: Never   Smokeless tobacco: Never  Vaping Use   Vaping status: Never Used  Substance and Sexual Activity   Alcohol use: Not Currently    Alcohol/week: 0.0 standard drinks of alcohol   Drug use: Never   Sexual activity: Not Currently  Other Topics Concern   Not on file  Social History Narrative   Lives with spouse.  Has one dog.   Social Drivers of Corporate investment banker Strain: Low Risk  (10/15/2023)   Overall Financial Resource Strain  (CARDIA)    Difficulty of Paying Living Expenses: Not hard at all  Food Insecurity: No Food Insecurity (10/15/2023)   Hunger Vital Sign    Worried About Running Out of Food in the Last Year: Never true    Ran Out of Food in the Last Year: Never true  Transportation Needs: No Transportation Needs (10/15/2023)   PRAPARE - Administrator, Civil Service (Medical): No    Lack of Transportation (Non-Medical): No  Physical Activity: Sufficiently Active (10/15/2023)   Exercise Vital Sign    Days of Exercise per Week: 5 days    Minutes of Exercise per Session: 30 min  Stress: Stress Concern Present (10/15/2023)   [employer] of Occupational Health - Occupational Stress Questionnaire    Feeling of Stress : To some extent  Social Connections: Socially Integrated (10/15/2023)   Social Connection and Isolation Panel    Frequency of Communication with Friends and Family: More than three times a week    Frequency of Social Gatherings with Friends and Family: More than three times a week    Attends Religious Services: More than 4 times per year    Active Member of [employer] or Organizations: Yes    Attends Banker Meetings: 1 to 4 times per year    Marital Status: Married  Catering manager Violence: Not At Risk (10/10/2023)   Humiliation, Afraid, Rape, and  Kick questionnaire    Fear of Current or Ex-Partner: No    Emotionally Abused: No    Physically Abused: No    Sexually Abused: No    FAMILY HISTORY: Family History  Problem Relation Age of Onset   Diabetes Mother    Hyperlipidemia Mother    Hypertension Mother    Rheum arthritis Brother    Diabetes Brother     ALLERGIES:  is allergic to biaxin [clarithromycin] and doxycycline .  MEDICATIONS:  Current Outpatient Medications  Medication Sig Dispense Refill   celecoxib  (CELEBREX ) 200 MG capsule Take 1 capsule (200 mg total) by mouth 2 (two) times daily as needed. 180 capsule 3   ciprofloxacin -dexamethasone  (CIPRODEX ) OTIC  suspension Place 4 drops into both ears 2 (two) times daily as needed. 7.5 mL 5   folic acid (FOLVITE) 800 MCG tablet Take 800 mcg by mouth daily.      losartan  (COZAAR ) 50 MG tablet TAKE 1 TABLET (50 MG) BY MOUTH IN THE MORNING AND AT BEDTIME 180 tablet 3   nitrofurantoin , macrocrystal-monohydrate, (MACROBID ) 100 MG capsule Take 1 capsule (100 mg total) by mouth 2 (two) times daily. 14 capsule 0   pantoprazole  (PROTONIX ) 40 MG tablet Take 1 tablet (40 mg total) by mouth daily. 90 tablet 1   valACYclovir  (VALTREX ) 1000 MG tablet Take 1 tablet (1,000 mg total) by mouth 2 (two) times daily. 20 tablet 0   vitamin B-12 (CYANOCOBALAMIN) 1000 MCG tablet Take 1,000 mcg by mouth daily.     No current facility-administered medications for this visit.    REVIEW OF SYSTEMS:   .10 Point review of Systems was done is negative except as noted above.  PHYSICAL EXAMINATION: ECOG FS:1 - Symptomatic but completely ambulatory  There were no vitals filed for this visit.  Wt Readings from Last 3 Encounters:  03/04/24 130 lb (59 kg)  02/10/24 129 lb (58.5 kg)  11/11/23 135 lb 8 oz (61.5 kg)   There is no height or weight on file to calculate BMI.    GENERAL:alert, in no acute distress and comfortable SKIN: no acute rashes, no significant lesions EYES: conjunctiva are pink and non-injected, sclera anicteric OROPHARYNX: MMM, no exudates, no oropharyngeal erythema or ulceration NECK: supple, no JVD LYMPH:  no palpable lymphadenopathy in the cervical, axillary or inguinal regions LUNGS: clear to auscultation b/l with normal respiratory effort HEART: regular rate & rhythm ABDOMEN:  normoactive bowel sounds , non tender, not distended. Extremity: no pedal edema PSYCH: alert & oriented x 3 with fluent speech NEURO: no focal motor/sensory deficits   LABORATORY DATA:  I have reviewed the data as listed  .    Latest Ref Rng & Units 05/11/2024    9:58 AM 11/11/2023    9:46 AM 10/15/2023    4:15 PM  CBC   WBC 4.0 - 10.5 K/uL 4.7  4.4  6.5   Hemoglobin 12.0 - 15.0 g/dL 84.9  85.5  85.0   Hematocrit 36.0 - 46.0 % 43.8  42.5  44.2   Platelets 150 - 400 K/uL 215  224  259.0    CBC    Component Value Date/Time   WBC 4.7 05/11/2024 0958   WBC 6.5 10/15/2023 1615   RBC 5.24 (H) 05/11/2024 0958   HGB 15.0 05/11/2024 0958   HGB 12.8 09/10/2017 0801   HCT 43.8 05/11/2024 0958   HCT 39.4 09/10/2017 0801   PLT 215 05/11/2024 0958   PLT 144 (L) 09/10/2017 0801   MCV 83.6 05/11/2024 0958  MCV 79.0 (L) 09/10/2017 0801   MCH 28.6 05/11/2024 0958   MCHC 34.2 05/11/2024 0958   RDW 13.4 05/11/2024 0958   RDW 15.6 (H) 09/10/2017 0801   LYMPHSABS 1.3 05/11/2024 0958   LYMPHSABS 0.9 09/10/2017 0801   MONOABS 0.5 05/11/2024 0958   MONOABS 0.4 09/10/2017 0801   EOSABS 0.2 05/11/2024 0958   EOSABS 0.1 09/10/2017 0801   BASOSABS 0.0 05/11/2024 0958   BASOSABS 0.0 09/10/2017 0801    .    Latest Ref Rng & Units 05/14/2024    9:27 AM 05/11/2024    9:58 AM 11/11/2023    9:46 AM  CMP  Glucose 70 - 99 mg/dL 897  884  897   BUN 6 - 23 mg/dL 19  16  20    Creatinine 0.40 - 1.20 mg/dL 9.30  9.26  9.22   Sodium 135 - 145 mEq/L 140  140  139   Potassium 3.5 - 5.1 mEq/L 3.9  4.3  3.9   Chloride 96 - 112 mEq/L 104  107  105   CO2 19 - 32 mEq/L 23  27  28    Calcium 8.4 - 10.5 mg/dL 9.2  9.5  9.3   Total Protein 6.5 - 8.1 g/dL  6.6  6.3   Total Bilirubin 0.0 - 1.2 mg/dL  0.8  0.6   Alkaline Phos 38 - 126 U/L  68  70   AST 15 - 41 U/L  18  17   ALT 0 - 44 U/L  18  17    . Lab Results  Component Value Date   LDH 200 (H) 05/11/2024   12/27/17 BM Bx:   12/27/17 Flow Cytometry:     RADIOGRAPHIC STUDIES: I have personally reviewed the radiological images as listed and agreed with the findings in the report. No results found.   ASSESSMENT & PLAN:   68 y.o. female with  1. 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 Patient appeared to be have significant constitutional  symptoms . No evidence of rheumatological or infectious etiology for her fevers and night sweats. Constitutional symptoms now resolved.  2. S/p Splenomegaly - likely due to SMZL. SABRAimproved on US  abd. Clinical no palpable splenomegaly today.  04/18/18 US  Abdomen revealed Splenomegaly with a length of 13.3 cm and a volume of 432 cc. By report, the spleen measured up to 15.1 cm in maximum dimension on the PET-CT from January 08, 2018 suggesting interval improvement.   3. S/p Anemia - with elevated LDH and low haptoglobin concerning for hemolysis. Coombs neg . Could be IgA driven Coombs neg AIHA. +ve reticulocytosis and Smear with some microspherocytes. No over urine hemosiderinuria. Partly anemia could also be from hypersplenism.    4.  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 Per rheumatology - no evidence of primary rheumatological disorder. No overt evidence of endocarditis.  5. h/o Fevers/chills/night sweat -- likely constitutional symptoms related to ?lymphoma - now resolved.   PLAN:   -Discussed lab results from today, 05/11/2024, in detail with patient  CBC shows hemoglobin of 15 normal WBC count of 4.7k with no lymphocytosis and normal platelet count of 215k CMP is within normal limits LDH is within normal limits at 200 Patient has no lab or clinical evidence of splenic marginal zone lymphoma progression at this time. No indication for additional treatment of her SMZL at this time   FOLLOW-UP: RTC with Dr Onesimo with labs in 6 months  The total time  spent in the appointment was 20 minutes* .  All of the patient's questions were answered with apparent satisfaction. The patient knows to call the clinic with any problems, questions or concerns.   Emily Saran MD MS AAHIVMS [facility] Hematology/Oncology Physician [facility]  .*Total Encounter Time as defined by the Centers for Medicare and Medicaid Services includes, in addition to the  face-to-face time of a patient visit (documented in the note above) non-face-to-face time: obtaining and reviewing outside history, ordering and reviewing medications, tests or procedures, care coordination (communications with other health care professionals or caregivers) and documentation in the medical record.    I,Emily Foley,acting as a Neurosurgeon for Emily Saran, MD.,have documented all relevant documentation on the behalf of Emily Saran, MD,as directed by  Emily Saran, MD while in the presence of Emily Saran, MD.  .I have reviewed the above documentation for accuracy and completeness, and I agree with the above. .Emily Leopard Kishore Lexxie Winberg MD

## 2024-05-11 ENCOUNTER — Inpatient Hospital Stay (HOSPITAL_BASED_OUTPATIENT_CLINIC_OR_DEPARTMENT_OTHER): Payer: Medicare Other | Admitting: Hematology

## 2024-05-11 ENCOUNTER — Inpatient Hospital Stay: Payer: Medicare Other | Attending: Hematology

## 2024-05-11 VITALS — BP 148/89 | HR 73 | Temp 97.9°F | Resp 20 | Wt 134.7 lb

## 2024-05-11 DIAGNOSIS — D649 Anemia, unspecified: Secondary | ICD-10-CM | POA: Diagnosis not present

## 2024-05-11 DIAGNOSIS — Z87440 Personal history of urinary (tract) infections: Secondary | ICD-10-CM | POA: Insufficient documentation

## 2024-05-11 DIAGNOSIS — C8307 Small cell B-cell lymphoma, spleen: Secondary | ICD-10-CM | POA: Diagnosis not present

## 2024-05-11 LAB — CMP (CANCER CENTER ONLY)
ALT: 18 U/L (ref 0–44)
AST: 18 U/L (ref 15–41)
Albumin: 4.7 g/dL (ref 3.5–5.0)
Alkaline Phosphatase: 68 U/L (ref 38–126)
Anion gap: 6 (ref 5–15)
BUN: 16 mg/dL (ref 8–23)
CO2: 27 mmol/L (ref 22–32)
Calcium: 9.5 mg/dL (ref 8.9–10.3)
Chloride: 107 mmol/L (ref 98–111)
Creatinine: 0.73 mg/dL (ref 0.44–1.00)
GFR, Estimated: >60 mL/min (ref >60)
Glucose, Bld: 115 mg/dL — ABNORMAL HIGH (ref 70–99)
Potassium: 4.3 mmol/L (ref 3.5–5.1)
Sodium: 140 mmol/L (ref 135–145)
Total Bilirubin: 0.8 mg/dL (ref 0.0–1.2)
Total Protein: 6.6 g/dL (ref 6.5–8.1)

## 2024-05-11 LAB — CBC WITH DIFFERENTIAL (CANCER CENTER ONLY)
Abs Immature Granulocytes: 0.01 K/uL (ref 0.00–0.07)
Basophils Absolute: 0 K/uL (ref 0.0–0.1)
Basophils Relative: 1 %
Eosinophils Absolute: 0.2 K/uL (ref 0.0–0.5)
Eosinophils Relative: 3 %
HCT: 43.8 % (ref 36.0–46.0)
Hemoglobin: 15 g/dL (ref 12.0–15.0)
Immature Granulocytes: 0 %
Lymphocytes Relative: 26 %
Lymphs Abs: 1.3 K/uL (ref 0.7–4.0)
MCH: 28.6 pg (ref 26.0–34.0)
MCHC: 34.2 g/dL (ref 30.0–36.0)
MCV: 83.6 fL (ref 80.0–100.0)
Monocytes Absolute: 0.5 K/uL (ref 0.1–1.0)
Monocytes Relative: 10 %
Neutro Abs: 2.8 K/uL (ref 1.7–7.7)
Neutrophils Relative %: 60 %
Platelet Count: 215 K/uL (ref 150–400)
RBC: 5.24 MIL/uL — ABNORMAL HIGH (ref 3.87–5.11)
RDW: 13.4 % (ref 11.5–15.5)
WBC Count: 4.7 K/uL (ref 4.0–10.5)
nRBC: 0 % (ref 0.0–0.2)

## 2024-05-11 LAB — LACTATE DEHYDROGENASE: LDH: 200 U/L — ABNORMAL HIGH (ref 98–192)

## 2024-05-13 ENCOUNTER — Ambulatory Visit: Payer: Self-pay | Admitting: *Deleted

## 2024-05-13 NOTE — Telephone Encounter (Signed)
 FYI Only or Action Required?: FYI only for provider.  Patient was last seen in primary care on 03/04/2024 by Geofm Glade PARAS, MD.  Called Nurse Triage reporting Dysuria.  Symptoms began yesterday.  Interventions attempted: Rest, hydration, or home remedies.  Symptoms are: gradually worsening.  Triage Disposition: See HCP Within 4 Hours (Or PCP Triage)  Patient/caregiver understands and will follow disposition?: Yes              Copied from CRM #[id number]. Topic: Clinical - Red Word Triage >> May 13, 2024  2:17 PM Pinkey ORN wrote: Red Word that prompted transfer to Nurse Triage: UTI >> May 13, 2024  2:18 PM Pinkey ORN wrote: Patient has been experiencing symptoms since yesterday afternoon. Burning, itching Reason for Disposition  Has had heart valve replacement or joint replacement    Joint replacement  Answer Assessment - Initial Assessment Questions No available appt until tomorrow with other provider. Recommended UC , CAL contacted and offered appt at other [facility] location and patient declined. Appt scheduled for tomorrow with other provider. Encouraged increase liquids.      1. SEVERITY: How bad is the pain?  (e.g., Scale 1-10; mild, moderate, or severe)     Becoming severe 2. FREQUENCY: How many times have you had painful urination today?      Every time 3. PATTERN: Is pain present every time you urinate or just sometimes?      Every time  4. ONSET: When did the painful urination start?      Yesterday  5. FEVER: Do you have a fever? If Yes, ask: What is your temperature, how was it measured, and when did it start?     No  6. PAST UTI: Have you had a urine infection before? If Yes, ask: When was the last time? and What happened that time?      Yes x 2  7. CAUSE: What do you think is causing the painful urination?  (e.g., UTI, scratch, Herpes sore)     UTI 8. OTHER SYMPTOMS: Do you have any other symptoms? (e.g., blood in urine, flank  pain, genital sores, urgency, vaginal discharge)     Low abdominal pain , moderate pain  urgency , small amount of blood noted on tissue today. Burning , itching. Home test positive today . Up urinating every 30 minutes last night  9. PREGNANCY: Is there any chance you are pregnant? When was your last menstrual period?     na  Protocols used: Urination Pain - Female-A-AH

## 2024-05-14 ENCOUNTER — Ambulatory Visit: Payer: Self-pay | Admitting: Family Medicine

## 2024-05-14 ENCOUNTER — Encounter: Payer: Self-pay | Admitting: Family Medicine

## 2024-05-14 ENCOUNTER — Ambulatory Visit (INDEPENDENT_AMBULATORY_CARE_PROVIDER_SITE_OTHER): Admitting: Family Medicine

## 2024-05-14 ENCOUNTER — Telehealth: Payer: Self-pay | Admitting: Internal Medicine

## 2024-05-14 VITALS — BP 144/70 | HR 74 | Temp 98.3°F | Ht 62.0 in | Wt 134.2 lb

## 2024-05-14 DIAGNOSIS — R81 Glycosuria: Secondary | ICD-10-CM | POA: Diagnosis not present

## 2024-05-14 DIAGNOSIS — N952 Postmenopausal atrophic vaginitis: Secondary | ICD-10-CM

## 2024-05-14 DIAGNOSIS — R300 Dysuria: Secondary | ICD-10-CM

## 2024-05-14 LAB — HEMOGLOBIN A1C: Hgb A1c MFr Bld: 5.4 % (ref 4.6–6.5)

## 2024-05-14 LAB — POCT URINALYSIS DIP (CLINITEK)
Bilirubin, UA: NEGATIVE
Glucose, UA: 250 mg/dL — AB
Ketones, POC UA: NEGATIVE mg/dL
Nitrite, UA: NEGATIVE
POC PROTEIN,UA: NEGATIVE
Spec Grav, UA: 1.01 (ref 1.010–1.025)
Urobilinogen, UA: 0.2 U/dL
pH, UA: 6 (ref 5.0–8.0)

## 2024-05-14 LAB — BASIC METABOLIC PANEL WITH GFR
BUN: 19 mg/dL (ref 6–23)
CO2: 23 meq/L (ref 19–32)
Calcium: 9.2 mg/dL (ref 8.4–10.5)
Chloride: 104 meq/L (ref 96–112)
Creatinine, Ser: 0.69 mg/dL (ref 0.40–1.20)
GFR: 89.6 mL/min (ref >60.00)
Glucose, Bld: 102 mg/dL — ABNORMAL HIGH (ref 70–99)
Potassium: 3.9 meq/L (ref 3.5–5.1)
Sodium: 140 meq/L (ref 135–145)

## 2024-05-14 MED ORDER — ESTRADIOL 0.1 MG/GM VA CREA: TOPICAL_CREAM | VAGINAL | 12 refills | Status: AC

## 2024-05-14 MED ORDER — NITROFURANTOIN MONOHYD MACRO 100 MG PO CAPS: 100.0000 mg | ORAL_CAPSULE | Freq: Two times a day (BID) | ORAL | 0 refills | Status: AC

## 2024-05-14 MED ORDER — ESTRADIOL 0.1 MG/GM VA CREA: TOPICAL_CREAM | VAGINAL | 12 refills | Status: DC

## 2024-05-14 NOTE — Progress Notes (Signed)
 Acute Office Visit  Subjective:     Patient ID: Emily Foley, female    DOB: [birth year], 68 y.o.   MRN: [id number]  Chief Complaint  Patient presents with   Acute Visit    Symptoms ongoing since Tuesday, abdominal pressure and pain. Urinary frequency, feels like razor blades when urinating    HPI  68 year old female presents for evaluation of dysuria today. States this is her third UTI this year. Labs reviewed by me, had 2 UTIs in May that were E. coli. States that she discussed the use of estrogen cream with Dr. Geofm at her last visit.  Atrophy possible cause of recurrent UTI. Also reports abdominal pain and pressure, with urinary frequency. Denies nausea, vomiting, diarrhea, rash, fever, other symptoms.   ROS Per HPI      Objective:    BP (!) 144/70 (BP Location: Left Arm, Patient Position: Sitting)   Pulse 74   Temp 98.3 F (36.8 C) (Temporal)   Ht 5' 2 (1.575 m)   Wt 134 lb 3.2 oz (60.9 kg)   SpO2 98%   BMI 24.55 kg/m    Physical Exam Vitals and nursing note reviewed.  Constitutional:      General: She is not in acute distress.    Appearance: Normal appearance. She is normal weight.  HENT:     Head: Normocephalic and atraumatic.     Right Ear: External ear normal.     Left Ear: External ear normal.     Nose: Nose normal.     Mouth/Throat:     Mouth: Mucous membranes are moist.     Pharynx: Oropharynx is clear.  Eyes:     Extraocular Movements: Extraocular movements intact.     Pupils: Pupils are equal, round, and reactive to light.  Cardiovascular:     Rate and Rhythm: Normal rate and regular rhythm.     Pulses: Normal pulses.     Heart sounds: Normal heart sounds.  Pulmonary:     Effort: Pulmonary effort is normal. No respiratory distress.     Breath sounds: Normal breath sounds. No wheezing, rhonchi or rales.  Musculoskeletal:        General: Normal range of motion.     Cervical back: Normal range of motion.     Right lower leg: No edema.      Left lower leg: No edema.  Lymphadenopathy:     Cervical: No cervical adenopathy.  Neurological:     General: No focal deficit present.     Mental Status: She is alert and oriented to person, place, and time.  Psychiatric:        Mood and Affect: Mood normal.        Thought Content: Thought content normal.     Results for orders placed or performed in visit on 05/14/24  POCT URINALYSIS DIP (CLINITEK)  Result Value Ref Range   Color, UA light yellow (A) yellow   Clarity, UA cloudy (A) clear   Glucose, UA =250 (A) negative mg/dL   Bilirubin, UA negative negative   Ketones, POC UA negative negative mg/dL   Spec Grav, UA 8.989 8.989 - 1.025   Blood, UA small (A) negative   pH, UA 6.0 5.0 - 8.0   POC PROTEIN,UA negative negative, trace   Urobilinogen, UA 0.2 0.2 or 1.0 E.U./dL   Nitrite, UA Negative Negative   Leukocytes, UA Moderate (2+) (A) Negative        Assessment & Plan:   Dysuria -  POCT URINALYSIS DIP (CLINITEK) -     Urine Culture -     Basic metabolic panel with GFR -     Hemoglobin A1c -     Estradiol ; Topically once daily for a week, then twice a week afterward  Dispense: 42.5 g; Refill: 12 -     Nitrofurantoin  Monohyd Macro; Take 1 capsule (100 mg total) by mouth 2 (two) times daily for 5 days.  Dispense: 10 capsule; Refill: 0  Glucosuria -     Estradiol ; Topically once daily for a week, then twice a week afterward  Dispense: 42.5 g; Refill: 12 -     Nitrofurantoin  Monohyd Macro; Take 1 capsule (100 mg total) by mouth 2 (two) times daily for 5 days.  Dispense: 10 capsule; Refill: 0  Vaginal atrophy -     Estradiol ; Topically once daily for a week, then twice a week afterward  Dispense: 42.5 g; Refill: 12      Orders Placed This Encounter  Procedures   Urine Culture   Basic metabolic panel with GFR   HgB J8r   POCT URINALYSIS DIP (CLINITEK)     Meds ordered this encounter  Medications   estradiol  (ESTRACE ) 0.1 MG/GM vaginal cream    Sig:  Topically once daily for a week, then twice a week afterward    Dispense:  42.5 g    Refill:  12   nitrofurantoin , macrocrystal-monohydrate, (MACROBID ) 100 MG capsule    Sig: Take 1 capsule (100 mg total) by mouth 2 (two) times daily for 5 days.    Dispense:  10 capsule    Refill:  0    Return if symptoms worsen or fail to improve.  Corean LITTIE Ku, FNP

## 2024-05-14 NOTE — Telephone Encounter (Signed)
 Copied from CRM [id number]. Topic: Clinical - Medication Question >> May 14, 2024  9:36 AM Burnard DEL wrote: Reason for CRM: Pharmacy needs clarigication on instructions for prescription estradiol  (ESTRACE ) 0.1 MG/GM vaginal cream. They are needing the amount that she is suppose to use each time.  [facility] [id number] [place], [place] - [address]  Phone: [phone] Fax: [phone]

## 2024-05-14 NOTE — Addendum Note (Signed)
 Addended by: LENARD WILFORD RAMAN on: 05/14/2024 03:49 PM   Modules accepted: Orders

## 2024-05-14 NOTE — Patient Instructions (Signed)
 Your urine was concerning for infection in the office today. We have sent it to the lab for culture and confirmation.  We are checking labs today, will be in contact with any results that require further attention  I have sent in Macrobid  for you to take twice a day for 5 days.  Please take all of these antibiotics, even if you are beginning to feel better.  I have sent in Estrace  cream for you to use to your urethra topically once daily for a week, then twice a week thereafter.  I am hoping this will help decrease the frequency of your UTIs.  Follow-up with me for new or worsening symptoms.

## 2024-05-14 NOTE — Telephone Encounter (Signed)
 New script sent in

## 2024-05-16 LAB — URINE CULTURE

## 2024-05-27 ENCOUNTER — Encounter: Payer: Self-pay | Admitting: Internal Medicine

## 2024-05-27 NOTE — Progress Notes (Unsigned)
    Subjective:    Patient ID: Emily Foley, female    DOB: 10/06/1956, 68 y.o.   MRN: [id number]      HPI Emily Foley is here for No chief complaint on file.     Cultures: 05/14/24 - Ecoli  - macrobid   03/04/24:  Ecoli  - macrobid  02/10/24:  Ecoli - cefdinir     Medications and allergies reviewed with patient and updated if appropriate.  Current Outpatient Medications on File Prior to Visit  Medication Sig Dispense Refill   celecoxib  (CELEBREX ) 200 MG capsule Take 1 capsule (200 mg total) by mouth 2 (two) times daily as needed. 180 capsule 3   estradiol  (ESTRACE ) 0.1 MG/GM vaginal cream Apply 1g topically once daily for a week, then 1g twice a week afterward 42.5 g 12   folic acid (FOLVITE) 800 MCG tablet Take 800 mcg by mouth daily.      losartan  (COZAAR ) 50 MG tablet TAKE 1 TABLET (50 MG) BY MOUTH IN THE MORNING AND AT BEDTIME 180 tablet 3   pantoprazole  (PROTONIX ) 40 MG tablet Take 1 tablet (40 mg total) by mouth daily. 90 tablet 1   valACYclovir  (VALTREX ) 1000 MG tablet Take 1 tablet (1,000 mg total) by mouth 2 (two) times daily. 20 tablet 0   vitamin B-12 (CYANOCOBALAMIN) 1000 MCG tablet Take 1,000 mcg by mouth daily.     No current facility-administered medications on file prior to visit.    Review of Systems     Objective:  There were no vitals filed for this visit. BP Readings from Last 3 Encounters:  05/14/24 (!) 144/70  05/11/24 (!) 148/89  03/04/24 128/82   Wt Readings from Last 3 Encounters:  05/14/24 134 lb 3.2 oz (60.9 kg)  05/11/24 134 lb 11.2 oz (61.1 kg)  03/04/24 130 lb (59 kg)   There is no height or weight on file to calculate BMI.    Physical Exam Constitutional:      General: She is not in acute distress.    Appearance: Normal appearance. She is not ill-appearing.  HENT:     Head: Normocephalic.  Eyes:     Conjunctiva/sclera: Conjunctivae normal.  Abdominal:     General: There is no distension.     Palpations: Abdomen is soft.      Tenderness: There is no abdominal tenderness. There is no right CVA tenderness, left CVA tenderness, guarding or rebound.  Skin:    General: Skin is warm and dry.  Neurological:     Mental Status: She is alert.            Assessment & Plan:    See Problem List for Assessment and Plan of chronic medical problems.

## 2024-05-28 ENCOUNTER — Ambulatory Visit: Admitting: Internal Medicine

## 2024-05-28 ENCOUNTER — Encounter: Payer: Self-pay | Admitting: Internal Medicine

## 2024-05-28 ENCOUNTER — Ambulatory Visit: Payer: Self-pay | Admitting: Internal Medicine

## 2024-05-28 VITALS — BP 148/86 | HR 69 | Temp 98.0°F | Wt 133.2 lb

## 2024-05-28 DIAGNOSIS — N3000 Acute cystitis without hematuria: Secondary | ICD-10-CM | POA: Diagnosis not present

## 2024-05-28 DIAGNOSIS — R1031 Right lower quadrant pain: Secondary | ICD-10-CM

## 2024-05-28 DIAGNOSIS — R350 Frequency of micturition: Secondary | ICD-10-CM | POA: Diagnosis not present

## 2024-05-28 DIAGNOSIS — I10 Essential (primary) hypertension: Secondary | ICD-10-CM

## 2024-05-28 LAB — URINALYSIS, ROUTINE W REFLEX MICROSCOPIC
Bilirubin Urine: NEGATIVE
Hgb urine dipstick: NEGATIVE
Ketones, ur: NEGATIVE
Nitrite: NEGATIVE
Specific Gravity, Urine: 1.02 (ref 1.000–1.030)
Total Protein, Urine: NEGATIVE
Urine Glucose: NEGATIVE
Urobilinogen, UA: 0.2 (ref 0.0–1.0)
pH: 6 (ref 5.0–8.0)

## 2024-05-28 LAB — POC URINALSYSI DIPSTICK (AUTOMATED)
Bilirubin, UA: NEGATIVE
Blood, UA: NEGATIVE
Glucose, UA: NEGATIVE
Ketones, UA: NEGATIVE
Leukocytes, UA: NEGATIVE
Nitrite, UA: NEGATIVE
Protein, UA: NEGATIVE
Spec Grav, UA: 1.025 (ref 1.010–1.025)
Urobilinogen, UA: 0.2 U/dL
pH, UA: 6 (ref 5.0–8.0)

## 2024-05-28 MED ORDER — SULFAMETHOXAZOLE-TRIMETHOPRIM 800-160 MG PO TABS: 1.0000 | ORAL_TABLET | Freq: Two times a day (BID) | ORAL | 0 refills | Status: AC

## 2024-05-28 NOTE — Assessment & Plan Note (Signed)
 Acute Has increased urinary frequency, right lower abdominal pain, right flank pain, nausea Concern for UTI versus kidney stone Urine dip here negative Will have lab run urinalysis and urine culture Depending on above results will need to do CT renal to rule out a stone Continue increased fluids Will hold off on antibiotics until lab urinalysis comes back

## 2024-05-28 NOTE — Assessment & Plan Note (Signed)
 Acute Comment a couple of days of right flank, right lower abdominal pain, nausea and urinary frequency Has had 2 UTIs in the past 3 months Just started using Estrace  cream Symptoms concerning for UTI versus nephrolithiasis Urine dip here negative-Will send urine to the lab for official UA and culture If urinalysis is negative will need CT renal to rule out stone

## 2024-05-28 NOTE — Assessment & Plan Note (Signed)
 Acute Urine dip was not consistent consistent with UTI, but urinalysis in the lab was Urine sent for culture Take Bactrim  DS  mg bid x 5 days  Take tylenol  if needed.   Increase your water intake.  Call if no improvement

## 2024-05-28 NOTE — Patient Instructions (Signed)
     Your urine should be back later today.

## 2024-05-28 NOTE — Assessment & Plan Note (Signed)
 Chronic BP slightly elevated here today  -but well-controlled at home No change in medication today She will monitor BP at home Continue losartan  50 mg twice daily

## 2024-05-29 LAB — URINE CULTURE

## 2024-06-03 ENCOUNTER — Telehealth: Payer: Self-pay | Admitting: Hematology

## 2024-07-13 ENCOUNTER — Encounter: Payer: Self-pay | Admitting: Internal Medicine

## 2024-07-13 ENCOUNTER — Ambulatory Visit: Payer: Self-pay | Admitting: *Deleted

## 2024-07-13 NOTE — Progress Notes (Signed)
    Subjective:    Patient ID: Emily Foley, female    DOB: 06-06-1956, 68 y.o.   MRN: [id number]      HPI Emily Foley is here for No chief complaint on file.        Medications and allergies reviewed with patient and updated if appropriate.  Current Outpatient Medications on File Prior to Visit  Medication Sig Dispense Refill   celecoxib  (CELEBREX ) 200 MG capsule Take 1 capsule (200 mg total) by mouth 2 (two) times daily as needed. 180 capsule 3   estradiol  (ESTRACE ) 0.1 MG/GM vaginal cream Apply 1g topically once daily for a week, then 1g twice a week afterward 42.5 g 12   folic acid (FOLVITE) 800 MCG tablet Take 800 mcg by mouth daily.      losartan  (COZAAR ) 50 MG tablet TAKE 1 TABLET (50 MG) BY MOUTH IN THE MORNING AND AT BEDTIME 180 tablet 3   pantoprazole  (PROTONIX ) 40 MG tablet Take 1 tablet (40 mg total) by mouth daily. 90 tablet 1   valACYclovir  (VALTREX ) 1000 MG tablet Take 1 tablet (1,000 mg total) by mouth 2 (two) times daily. 20 tablet 0   vitamin B-12 (CYANOCOBALAMIN) 1000 MCG tablet Take 1,000 mcg by mouth daily.     No current facility-administered medications on file prior to visit.    Review of Systems     Objective:  There were no vitals filed for this visit. BP Readings from Last 3 Encounters:  05/28/24 (!) 148/86  05/14/24 (!) 144/70  05/11/24 (!) 148/89   Wt Readings from Last 3 Encounters:  05/28/24 133 lb 4 oz (60.4 kg)  05/14/24 134 lb 3.2 oz (60.9 kg)  05/11/24 134 lb 11.2 oz (61.1 kg)   There is no height or weight on file to calculate BMI.    Physical Exam         Assessment & Plan:    See Problem List for Assessment and Plan of chronic medical problems.

## 2024-07-13 NOTE — Telephone Encounter (Signed)
 FYI Only or Action Required?: FYI only for provider.  Patient was last seen in primary care on 05/28/2024 by Geofm Glade PARAS, MD.  Called Nurse Triage reporting Chest Pain (Nausea, loss of appetitive, fatigue ).  Symptoms began several weeks ago.  Interventions attempted: OTC medications: tums.  Symptoms are: unchanged.  Triage Disposition: See Physician Within 24 Hours  Patient/caregiver understands and will follow disposition?: yes    Reason for Disposition  [1] Chest pain lasts > 5 minutes AND [2] occurred > 3 days ago (72 hours) AND [3] NO chest pain or cardiac symptoms now  Answer Assessment - Initial Assessment Questions 1. LOCATION: Where does it hurt?       Bloating after eating, lower end of sternum  2. RADIATION: Does the pain go anywhere else? (e.g., into neck, jaw, arms, back)     no 3. ONSET: When did the chest pain begin? (Minutes, hours or days)      Started 1-2 weeks- worse last couple days 4. PATTERN: Does the pain come and go, or has it been constant since it started?  Does it get worse with exertion?      Comes and goes  5. DURATION: How long does it last (e.g., seconds, minutes, hours)     Depends- can last over 1 hour- pressure 6. SEVERITY: How bad is the pain?  (e.g., Scale 1-10; mild, moderate, or severe)     No pain now 7. CARDIAC RISK FACTORS: Do you have any history of heart problems or risk factors for heart disease? (e.g., angina, prior heart attack; diabetes, high blood pressure, high cholesterol, smoker, or strong family history of heart disease)     no 8. PULMONARY RISK FACTORS: Do you have any history of lung disease?  (e.g., blood clots in lung, asthma, emphysema, birth control pills)     no 9. CAUSE: What do you think is causing the chest pain?     Hiatal hernia, reflux 10. OTHER SYMPTOMS: Do you have any other symptoms? (e.g., dizziness, nausea, vomiting, sweating, fever, difficulty breathing, cough)       Nausea,  fatigue  Protocols used: Chest Pain-A-AH   Copied from CRM #[id number]. Topic: Clinical - Red Word Triage >> Jul 13, 2024  9:03 AM Berneda FALCON wrote: Red Word that prompted transfer to Nurse Triage: Pt would like an appt for nausea, loss of appetite, pressure in chest, fatigue-ongoing for a couple weeks-worse in the last couple of days.

## 2024-07-14 ENCOUNTER — Ambulatory Visit (INDEPENDENT_AMBULATORY_CARE_PROVIDER_SITE_OTHER): Admitting: Internal Medicine

## 2024-07-14 VITALS — BP 150/90 | HR 79 | Temp 98.1°F | Ht 62.0 in | Wt 135.2 lb

## 2024-07-14 DIAGNOSIS — I10 Essential (primary) hypertension: Secondary | ICD-10-CM | POA: Diagnosis not present

## 2024-07-14 DIAGNOSIS — K219 Gastro-esophageal reflux disease without esophagitis: Secondary | ICD-10-CM

## 2024-07-14 MED ORDER — PANTOPRAZOLE SODIUM 40 MG PO TBEC: 40.0000 mg | DELAYED_RELEASE_TABLET | Freq: Every day | ORAL | 1 refills | Status: AC

## 2024-07-14 NOTE — Assessment & Plan Note (Signed)
 Chronic BP slightly elevated here today and has been slightly higher at home since she has regained a little bit of weight and possibly related to stress No change in medication today She will monitor BP at home-if remains elevated we will need to adjust medication Continue losartan  50 mg twice daily

## 2024-07-14 NOTE — Patient Instructions (Addendum)
     Medications changes include :   protonix         Return if symptoms worsen or fail to improve.

## 2024-07-14 NOTE — Assessment & Plan Note (Signed)
 Acute flare Has had history of GERD in the past Current symptoms likely related to flare of GERD Possibly related to increased stress recently, regaining some weight and chronic H. pylori infection Restarted pantoprazole  40 mg daily 3 days ago-continue-recommend he been taking it twice daily for a week and then going back to once a day to better control this quicker You will likely needs to have H. pylori treated Continue current diet She will update me if there is no improvement Pantoprazole  sent to pharmacy

## 2024-07-16 DIAGNOSIS — Z1231 Encounter for screening mammogram for malignant neoplasm of breast: Secondary | ICD-10-CM | POA: Diagnosis not present

## 2024-07-16 LAB — HM MAMMOGRAPHY

## 2024-07-17 ENCOUNTER — Encounter: Payer: Self-pay | Admitting: Internal Medicine

## 2024-07-17 MED ORDER — ONDANSETRON 4 MG PO TBDP: 4.0000 mg | ORAL_TABLET | Freq: Three times a day (TID) | ORAL | 2 refills | Status: AC | PRN

## 2024-07-20 ENCOUNTER — Encounter: Payer: Self-pay | Admitting: Internal Medicine

## 2024-08-03 ENCOUNTER — Encounter: Payer: Self-pay | Admitting: Internal Medicine

## 2024-08-03 ENCOUNTER — Encounter: Payer: Self-pay | Admitting: Family Medicine

## 2024-08-03 ENCOUNTER — Ambulatory Visit (INDEPENDENT_AMBULATORY_CARE_PROVIDER_SITE_OTHER): Admitting: Family Medicine

## 2024-08-03 VITALS — BP 134/90 | HR 83 | Temp 98.5°F | Ht 62.0 in | Wt 134.6 lb

## 2024-08-03 DIAGNOSIS — N390 Urinary tract infection, site not specified: Secondary | ICD-10-CM | POA: Diagnosis not present

## 2024-08-03 DIAGNOSIS — N952 Postmenopausal atrophic vaginitis: Secondary | ICD-10-CM

## 2024-08-03 DIAGNOSIS — R300 Dysuria: Secondary | ICD-10-CM | POA: Diagnosis not present

## 2024-08-03 LAB — POCT URINALYSIS DIP (CLINITEK)
Bilirubin, UA: NEGATIVE
Glucose, UA: NEGATIVE mg/dL
Ketones, POC UA: NEGATIVE mg/dL
Nitrite, UA: NEGATIVE
Spec Grav, UA: 1.015 (ref 1.010–1.025)
Urobilinogen, UA: 0.2 U/dL
pH, UA: 6 (ref 5.0–8.0)

## 2024-08-03 MED ORDER — NITROFURANTOIN MONOHYD MACRO 100 MG PO CAPS: 100.0000 mg | ORAL_CAPSULE | Freq: Two times a day (BID) | ORAL | 0 refills | Status: AC

## 2024-08-03 NOTE — Progress Notes (Signed)
 Acute Office Visit  Subjective:     Patient ID: Emily Foley, female    DOB: 1956-04-06, 68 y.o.   MRN: [id number]  No chief complaint on file.   HPI  Discussed the use of AI scribe software for clinical note transcription with the patient, who gave verbal consent to proceed.  History of Present Illness Emily Foley is a 68 year old female with recurrent urinary tract infections who presents with symptoms of another UTI.  Lower urinary tract symptoms - Dysuria and burning sensation with urination - Hematuria present - Symptoms temporarily improve with Azo, but recur after medication wears off - No fever or chills - Feels cold, attributed to weather - the last 4 days  Recurrent urinary tract infections - History of recurrent urinary tract infections - Last episode occurred in August - Initial test in August was positive, but urine culture was inconclusive - Antibiotic treatment in August led to symptom improvement by the third day - Urine cultures have consistently shown the same strain of E. coli, except for one instance in August  Vaginal atrophy - Uses estrace  cream inconsistently for vaginal atrophy     ROS Per HPI      Objective:    BP (!) 134/90 (BP Location: Left Arm, Patient Position: Sitting)   Pulse 83   Temp 98.5 F (36.9 C) (Temporal)   Ht 5' 2 (1.575 m)   Wt 134 lb 9.6 oz (61.1 kg)   BMI 24.62 kg/m    Physical Exam Vitals and nursing note reviewed.  Constitutional:      General: She is not in acute distress.    Appearance: Normal appearance. She is normal weight.  HENT:     Head: Normocephalic and atraumatic.     Mouth/Throat:     Mouth: Mucous membranes are moist.  Eyes:     Extraocular Movements: Extraocular movements intact.  Cardiovascular:     Rate and Rhythm: Normal rate and regular rhythm.     Pulses: Normal pulses.     Heart sounds: Normal heart sounds.  Pulmonary:     Effort: Pulmonary effort is normal. No respiratory  distress.     Breath sounds: No stridor. No wheezing, rhonchi or rales.  Chest:     Chest wall: No tenderness.  Abdominal:     General: There is no distension.     Palpations: There is no mass.     Tenderness: There is abdominal tenderness (Suprapubic). There is no right CVA tenderness, left CVA tenderness, guarding or rebound.     Hernia: No hernia is present.  Musculoskeletal:        General: Normal range of motion.     Cervical back: Normal range of motion.     Right lower leg: No edema.     Left lower leg: No edema.  Lymphadenopathy:     Cervical: No cervical adenopathy.  Neurological:     General: No focal deficit present.     Mental Status: She is alert and oriented to person, place, and time.  Psychiatric:        Mood and Affect: Mood normal.        Thought Content: Thought content normal.     Results for orders placed or performed in visit on 08/03/24  Urine Culture   Specimen: Urine  Result Value Ref Range   Source: URINE    Status: FINAL    Isolate 1: Escherichia coli (A)       Susceptibility  Escherichia coli - URINE CULTURE, REFLEX    AMOX/CLAVULANIC 16 Intermediate     AMPICILLIN/SULBACTAM >=32 Resistant     CEFAZOLIN* 8 Resistant      * For uncomplicated UTI caused by E. coli, K. pneumoniae or P. mirabilis: Cefazolin is susceptible if MIC <32 mcg/mL and predicts susceptible to the oral agents cefaclor, cefdinir , cefpodoxime, cefprozil, cefuroxime, cephalexin and loracarbef.     CEFTAZIDIME <=0.5 Sensitive     CEFEPIME <=0.12 Sensitive     CEFTRIAXONE <=0.25 Sensitive     CIPROFLOXACIN  <=0.06 Sensitive     LEVOFLOXACIN <=0.12 Sensitive     GENTAMICIN <=1 Sensitive     IMIPENEM <=0.25 Sensitive     MEROPENEM <=0.25 Sensitive     NITROFURANTOIN  <=16 Sensitive     PIP/TAZO <=4 Sensitive     TRIMETH /SULFA * <=20 Sensitive      * For uncomplicated UTI caused by E. coli, K. pneumoniae or P. mirabilis: Cefazolin is susceptible if MIC <32 mcg/mL and  predicts susceptible to the oral agents cefaclor, cefdinir , cefpodoxime, cefprozil, cefuroxime, cephalexin and loracarbef. Legend: S = Susceptible  I = Intermediate R = Resistant  NS = Not susceptible SDD = Susceptible Dose Dependent * = Not Tested  NR = Not Reported **NN = See Therapy Comments   POCT URINALYSIS DIP (CLINITEK)  Result Value Ref Range   Color, UA yellow yellow   Clarity, UA cloudy (A) clear   Glucose, UA negative negative mg/dL   Bilirubin, UA negative negative   Ketones, POC UA negative negative mg/dL   Spec Grav, UA 8.984 8.989 - 1.025   Blood, UA small (A) negative   pH, UA 6.0 5.0 - 8.0   POC PROTEIN,UA trace negative, trace   Urobilinogen, UA 0.2 0.2 or 1.0 E.U./dL   Nitrite, UA Negative Negative   Leukocytes, UA Moderate (2+) (A) Negative        Assessment & Plan:   Assessment and Plan Assessment & Plan Recurrent urinary tract infection,  dysuria Recurrent UTI likely due to E. coli. Symptoms include burning and stinging. No fever or chills. Recent cold and diarrhea may have contributed to dehydration. - Continue Azo for symptomatic relief. - Order urine culture to confirm infection and guide therapy. - Prescribe Macrobid  if culture is positive.  Postmenopausal atrophic vaginitis Inconsistent use of Estrace  cream may contribute to urinary symptoms. - Encouraged consistent use of Estrace  cream.     Orders Placed This Encounter  Procedures   Urine Culture    Standing Status:   Future    Number of Occurrences:   1    Expected Date:   08/03/2024    Expiration Date:   08/03/2025   POCT URINALYSIS DIP (CLINITEK)     Meds ordered this encounter  Medications   nitrofurantoin , macrocrystal-monohydrate, (MACROBID ) 100 MG capsule    Sig: Take 1 capsule (100 mg total) by mouth 2 (two) times daily for 5 days.    Dispense:  10 capsule    Refill:  0    Return if symptoms worsen or fail to improve.  Corean LITTIE Ku, FNP

## 2024-08-03 NOTE — Patient Instructions (Addendum)
 Your urine was concerning for infection in the office today. We have sent it to the lab for culture and confirmation.   Recommend AZO OTC for the next couple of days until the urine culture results.   If culture is positive, may start Macrobid  100mg  twice a day for 5 days.

## 2024-08-04 ENCOUNTER — Ambulatory Visit: Admitting: Internal Medicine

## 2024-08-04 DIAGNOSIS — R928 Other abnormal and inconclusive findings on diagnostic imaging of breast: Secondary | ICD-10-CM | POA: Diagnosis not present

## 2024-08-05 ENCOUNTER — Ambulatory Visit: Payer: Self-pay

## 2024-08-05 LAB — URINE CULTURE

## 2024-08-07 DIAGNOSIS — Z9181 History of falling: Secondary | ICD-10-CM | POA: Diagnosis not present

## 2024-08-07 DIAGNOSIS — Z4789 Encounter for other orthopedic aftercare: Secondary | ICD-10-CM | POA: Diagnosis not present

## 2024-08-07 DIAGNOSIS — Z96651 Presence of right artificial knee joint: Secondary | ICD-10-CM | POA: Diagnosis not present

## 2024-08-18 ENCOUNTER — Ambulatory Visit: Admitting: Hematology

## 2024-08-18 ENCOUNTER — Other Ambulatory Visit

## 2024-09-02 DIAGNOSIS — H04123 Dry eye syndrome of bilateral lacrimal glands: Secondary | ICD-10-CM | POA: Diagnosis not present

## 2024-09-02 DIAGNOSIS — H5203 Hypermetropia, bilateral: Secondary | ICD-10-CM | POA: Diagnosis not present

## 2024-09-02 DIAGNOSIS — H2513 Age-related nuclear cataract, bilateral: Secondary | ICD-10-CM | POA: Diagnosis not present

## 2024-09-02 DIAGNOSIS — H25012 Cortical age-related cataract, left eye: Secondary | ICD-10-CM | POA: Diagnosis not present

## 2024-09-16 DIAGNOSIS — Z96651 Presence of right artificial knee joint: Secondary | ICD-10-CM | POA: Diagnosis not present

## 2024-09-16 DIAGNOSIS — Z471 Aftercare following joint replacement surgery: Secondary | ICD-10-CM | POA: Diagnosis not present

## 2024-11-03 ENCOUNTER — Ambulatory Visit

## 2024-11-17 ENCOUNTER — Inpatient Hospital Stay

## 2024-11-17 ENCOUNTER — Inpatient Hospital Stay: Admitting: Hematology

## 2025-05-07 ENCOUNTER — Ambulatory Visit
